# Patient Record
Sex: Female | Born: 1935
Health system: Southern US, Community
[De-identification: ages and names within clinical notes are randomized; demographics above are authoritative.]

## PROBLEM LIST (undated history)

## (undated) DIAGNOSIS — R9431 Abnormal electrocardiogram [ECG] [EKG]: Secondary | ICD-10-CM

## (undated) DIAGNOSIS — L719 Rosacea, unspecified: Secondary | ICD-10-CM

## (undated) DIAGNOSIS — I10 Essential (primary) hypertension: Secondary | ICD-10-CM

## (undated) DIAGNOSIS — I4891 Unspecified atrial fibrillation: Secondary | ICD-10-CM

## (undated) HISTORY — DX: Unspecified atrial fibrillation: I48.91

## (undated) HISTORY — DX: Abnormal electrocardiogram (ECG) (EKG): R94.31

## (undated) HISTORY — DX: Essential (primary) hypertension: I10

## (undated) HISTORY — DX: Rosacea, unspecified: L71.9

## (undated) HISTORY — PX: VARICOSE VEIN SURGERY: SHX832

---

## 2004-03-21 ENCOUNTER — Emergency Department (HOSPITAL_COMMUNITY): Admission: EM | Admit: 2004-03-21 | Discharge: 2004-03-21 | Payer: Self-pay | Admitting: Emergency Medicine

## 2008-07-01 ENCOUNTER — Ambulatory Visit: Payer: Self-pay | Admitting: Cardiovascular Disease

## 2008-07-01 ENCOUNTER — Encounter: Payer: Self-pay | Admitting: Cardiovascular Disease

## 2008-07-01 ENCOUNTER — Inpatient Hospital Stay (HOSPITAL_COMMUNITY): Admission: EM | Admit: 2008-07-01 | Discharge: 2008-07-06 | Payer: Self-pay | Admitting: Emergency Medicine

## 2008-07-30 ENCOUNTER — Ambulatory Visit: Payer: Self-pay | Admitting: Cardiovascular Disease

## 2008-10-16 ENCOUNTER — Ambulatory Visit: Payer: Self-pay | Admitting: Cardiovascular Disease

## 2008-10-16 DIAGNOSIS — I480 Paroxysmal atrial fibrillation: Secondary | ICD-10-CM | POA: Insufficient documentation

## 2008-10-16 DIAGNOSIS — I4891 Unspecified atrial fibrillation: Secondary | ICD-10-CM

## 2009-04-15 ENCOUNTER — Ambulatory Visit: Payer: Self-pay | Admitting: Cardiovascular Disease

## 2009-04-15 DIAGNOSIS — I10 Essential (primary) hypertension: Secondary | ICD-10-CM | POA: Insufficient documentation

## 2009-04-15 DIAGNOSIS — L719 Rosacea, unspecified: Secondary | ICD-10-CM | POA: Insufficient documentation

## 2009-04-30 ENCOUNTER — Ambulatory Visit: Payer: Self-pay | Admitting: Internal Medicine

## 2009-04-30 ENCOUNTER — Inpatient Hospital Stay (HOSPITAL_COMMUNITY): Admission: EM | Admit: 2009-04-30 | Discharge: 2009-05-03 | Payer: Self-pay | Admitting: Emergency Medicine

## 2009-06-10 ENCOUNTER — Ambulatory Visit: Payer: Self-pay | Admitting: Cardiovascular Disease

## 2009-06-15 LAB — CONVERTED CEMR LAB: Cortisol, Plasma: 9.5 ug/dL

## 2010-01-10 ENCOUNTER — Ambulatory Visit: Payer: Self-pay | Admitting: Cardiovascular Disease

## 2010-02-23 IMAGING — CR DG CHEST 1V PORT
1 series · 1 of 1 positions shown · non-contrast
Comparison: 07/01/2008

CLINICAL DATA: Chest pain.  Palpitations.

PORTABLE CHEST - 1 VIEW

[AP]
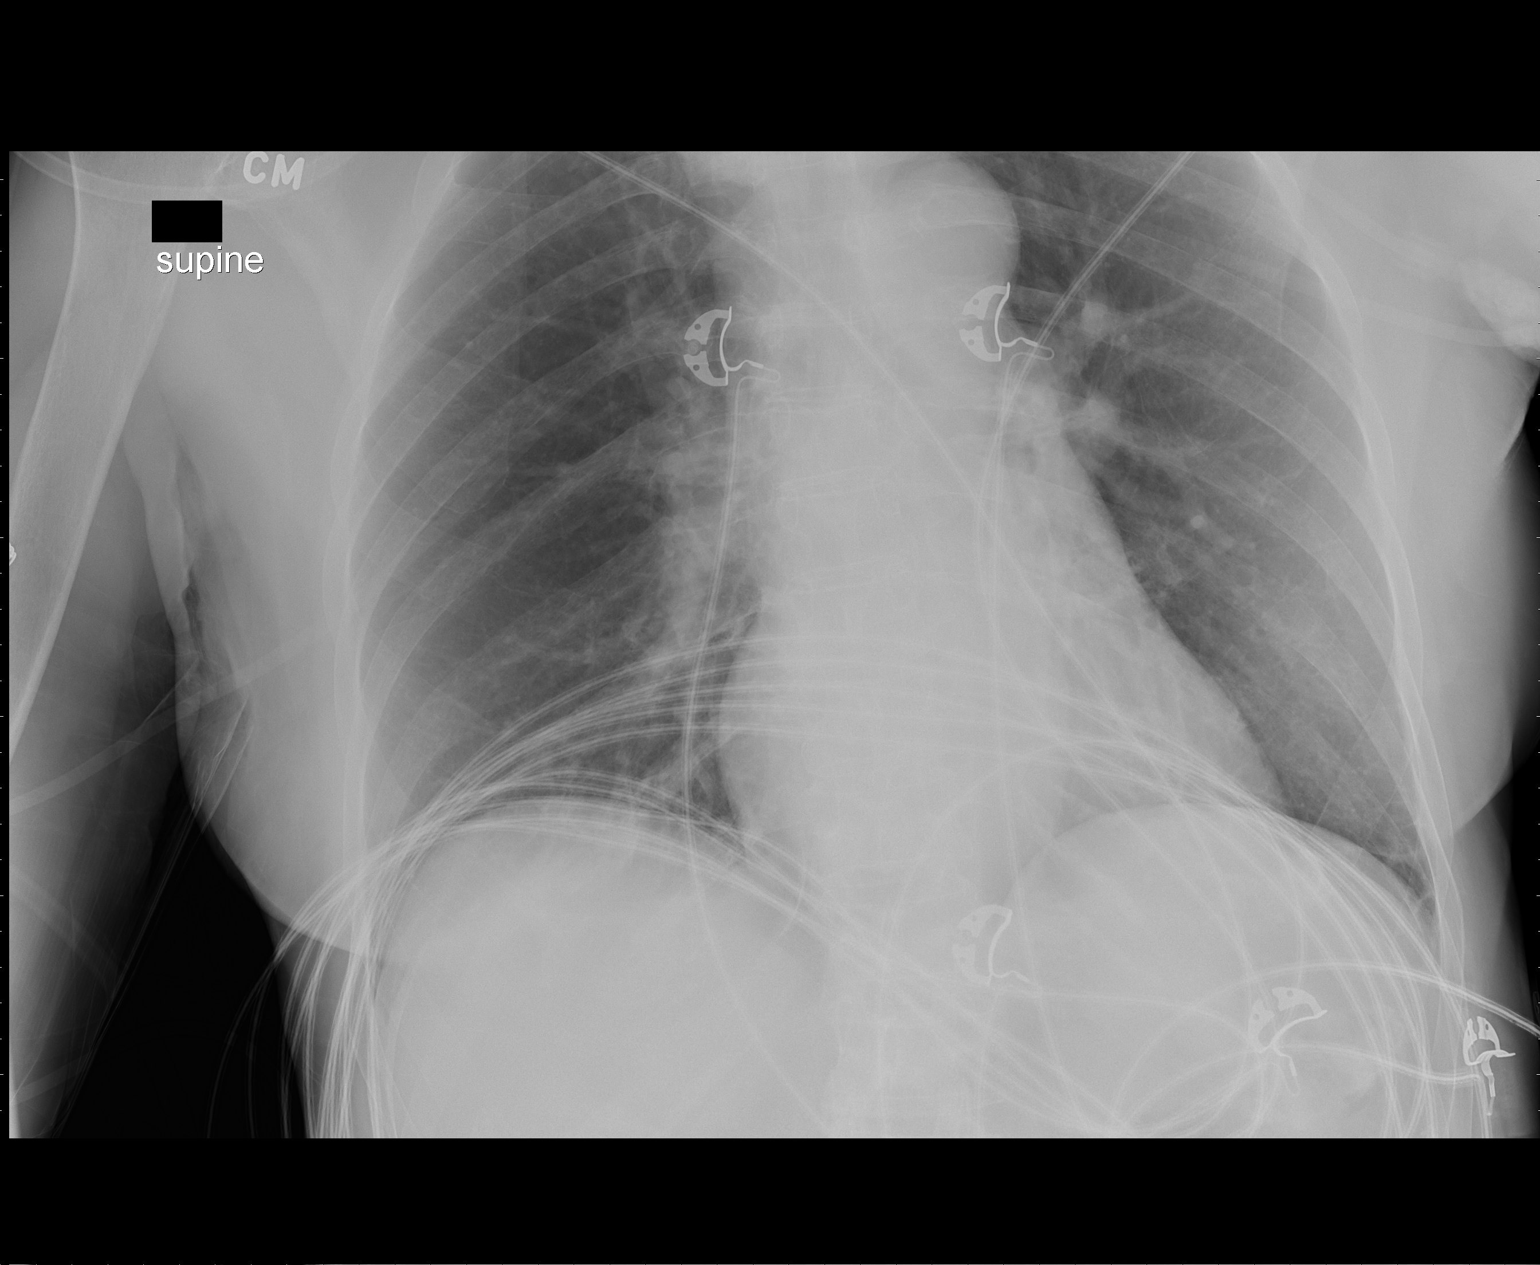

[1 of 1 positions shown; findings below may reference images not displayed]

FINDINGS: Numerous leads and wires project over the lower chest.
The lung apices are minimally excluded. Midline trachea. Normal
heart size.  Apparent right paratracheal soft tissue fullness is
likely due to technique.  No pleural effusion.  No definite
pneumothorax. Clear lungs.
IMPRESSION: 1. No acute cardiopulmonary disease.
2.  Minimal exclusion of the lung apices.

## 2010-04-08 ENCOUNTER — Encounter: Payer: Self-pay | Admitting: Cardiovascular Disease

## 2010-04-29 ENCOUNTER — Ambulatory Visit (HOSPITAL_COMMUNITY): Admission: RE | Admit: 2010-04-29 | Discharge: 2010-04-29 | Payer: Self-pay | Admitting: Internal Medicine

## 2010-05-02 ENCOUNTER — Ambulatory Visit: Payer: Self-pay | Admitting: Cardiovascular Disease

## 2010-05-02 DIAGNOSIS — R9431 Abnormal electrocardiogram [ECG] [EKG]: Secondary | ICD-10-CM | POA: Insufficient documentation

## 2010-07-22 ENCOUNTER — Encounter: Payer: Self-pay | Admitting: Cardiovascular Disease

## 2010-11-08 ENCOUNTER — Ambulatory Visit: Payer: Self-pay | Admitting: Cardiovascular Disease

## 2011-01-08 LAB — CONVERTED CEMR LAB
Cholesterol, target level: 200 mg/dL
HDL goal, serum: 40 mg/dL
LDL Goal: 130 mg/dL

## 2011-01-10 NOTE — Assessment & Plan Note (Signed)
Summary: eph   History of Present Illness: Amanda Olsen is seen today for followup of her paroxysmal atrial fibrillation.  She was recently hospitalized for her second recurrent event.  She was somewhat hypotensive during the hospitalization in A. fib.  She converted with higher dose flecainide at 100 mg b.i.d.  She still does taking aspirin.  She feels much better than when she was hospitalized.  Unfortunately she had a significant hematoma with her Pneumovax shot this also seems to have healed.  She has not had any recurrent palpitations.  Her random cortisol level was low in the hospital and needs to be repeated.  She has not any TIA or CVA-like symptoms.  She is walking with some fatigue but no dyspnea.  Problems Prior to Update: 1)  Essential Hypertension, Benign  (ICD-401.1) 2)  Rosacea  (ICD-695.3) 3)  Atrial Fibrillation  (ICD-427.31)  Current Problems (verified): 1)  Essential Hypertension, Benign  (ICD-401.1) 2)  Rosacea  (ICD-695.3) 3)  Atrial Fibrillation  (ICD-427.31)  Current Medications (verified): 1)  Aspirin Ec 325 Mg Tbec (Aspirin) .... Take One Tablet By Mouth Daily 2)  Flecainide Acetate 100 Mg Tabs (Flecainide Acetate) .... Take One Tablet By Mouth Every 12 Hours 3)  Vitamin D .... 1 Tab By Mouth Once Daily  Allergies (verified): No Known Drug Allergies  Past History:  Past Medical History: Last updated: 04/15/2009 Atrial Fibrillation Varicose Vein Stripping Cataract Hypertension  Family History: Last updated: 10/16/2008 Negative FH of Diabetes, Hypertension, or Coronary Artery Disease  Social History: Last updated: 10/16/2008 Lives with son and daughter in [place] Originally from [place] [employer] degree Does not smoke or drink  Review of Systems       Denies fever, malais, weight loss, blurry vision, decreased visual acuity, cough, sputum, SOB, hemoptysis, pleuritic pain, palpitaitons, heartburn, abdominal pain, melena, lower extremity edema, claudication,  or rash.  All othr systems reviewed and negative   Vital Signs:  Patient profile:   77 year old female Height:      66 inches Weight:      147 pounds BMI:     23.81 Pulse rate:   72 / minute Resp:     12 per minute BP sitting:   149 / 78  (left arm)  Vitals Entered By: Kem Parkinson (June 10, 2009 3:38 PM)  Physical Exam  General:  Affect appropriate Healthy:  appears stated age HEENT: normal Neck supple with no adenopathy JVP normal no bruits no thyromegaly Lungs clear with no wheezing and good diaphragmatic motion Heart:  S1/S2 no murmur,rub, gallop or click PMI normal Abdomen: benighn, BS positve, no tenderness, no AAA no bruit.  No HSM or HJR Distal pulses intact with no bruits No edema Neuro non-focal Skin warm and dry Rosacea on cheeks   Impression & Recommendations:  Problem # 1:  ATRIAL FIBRILLATION (ICD-427.31)  In NSR.  Still don't think she needs coumadin.  Two episodes with a [place] score of 2 continue ASA and  higher dose Flecainide Her updated medication list for this problem includes:    Aspirin Ec 325 Mg Tbec (Aspirin) .Marland Kitchen... Take one tablet by mouth daily    Flecainide Acetate 100 Mg Tabs (Flecainide acetate) .Marland Kitchen... Take one tablet by mouth every 12 hours  Her updated medication list for this problem includes:    Aspirin Ec 325 Mg Tbec (Aspirin) .Marland Kitchen... Take one tablet by mouth daily    Flecainide Acetate 100 Mg Tabs (Flecainide acetate) .Marland Kitchen... Take one tablet by mouth every 12  hours  Problem # 2:  ESSENTIAL HYPERTENSION, BENIGN (ICD-401.1)  She has significant hypotension in hospital witha low cortisol level.  Will recheck random cortisol today Her updated medication list for this problem includes:    Aspirin Ec 325 Mg Tbec (Aspirin) .Marland Kitchen... Take one tablet by mouth daily  Her updated medication list for this problem includes:    Aspirin Ec 325 Mg Tbec (Aspirin) .Marland Kitchen... Take one tablet by mouth daily  Orders: TLB-Cortisol (82533-CORT)  Other  Orders: T- * Misc. Laboratory test [phone])  Patient Instructions: 1)  Your physician wants you to follow-up in 6months   You will receive a reminder letter in the mail two months in advance. If you don't receive a letter, please call our office to schedule the follow-up appointment.

## 2011-01-10 NOTE — Assessment & Plan Note (Signed)
Summary: F3M/DM   Visit Type:  3 mo f/u Primary Provider:  Thayer Headings  CC:  no complaints today.  History of Present Illness: Amanda Olsen is seen today for F/U of HTN and PAF.  She has maintained NSR on Flecainide.  She did not have proarrythmia on ETT.  She has good LV function and no CAD by cath.   Her last visit we started ACE for BP contorl.    She sold her apartment there and is living independanlty in an apartment in [place].  She denies SSCP, palpitations, syncope, edema or dyspnea.  I reviewed lab work from 04/08/10 and her K was 3.9 with Cr .7 and normal LFTS.  LDL was 103.  Her ECG is chronically abnormal.  Today she had significant anterolateral T wave inversions that were unchanged from 5/10.  QT was stable at 424.  Lipid Management History:      Positive NCEP/ATP III risk factors include female age 18 years old or older and hypertension.    Current Problems (verified): 1)  Essential Hypertension, Benign  (ICD-401.1) 2)  Rosacea  (ICD-695.3) 3)  Atrial Fibrillation  (ICD-427.31)  Current Medications (verified): 1)  Aspirin Ec 325 Mg Tbec (Aspirin) .... Take One Tablet By Mouth Daily 2)  Flecainide Acetate 100 Mg Tabs (Flecainide Acetate) .... Take One Tablet By Mouth Every 12 Hours 3)  Lisinopril 10 Mg Tabs (Lisinopril) .... Take One Tablet By Mouth Daily 4)  Levothyroxine Sodium 25 Mcg Tabs (Levothyroxine Sodium) .Marland Kitchen.. 1 Tab Once Daily 5)  Vitamin D 2000 Unit Tabs (Cholecalciferol) .Marland Kitchen.. 1 Tab Once Daily 6)  Calcium 600 Mg Tabs (Calcium) .Marland Kitchen.. 1 Tab Once Daily 7)  Reclast 5 Mg/129ml Soln (Zoledronic Acid) .... As Directed  Allergies (verified): No Known Drug Allergies  Past History:  Past Medical History: Last updated: 04/15/2009 Atrial Fibrillation Varicose Vein Stripping Cataract Hypertension  Family History: Last updated: 10/16/2008 Negative FH of Diabetes, Hypertension, or Coronary Artery Disease  Social History: Last updated: 10/16/2008 Lives with son and  daughter in [place] Originally from [place] [employer] degree Does not smoke or drink  Review of Systems       Denies fever, malais, weight loss, blurry vision, decreased visual acuity, cough, sputum, SOB, hemoptysis, pleuritic pain, palpitaitons, heartburn, abdominal pain, melena, lower extremity edema, claudication, or rash.   Vital Signs:  Patient profile:   77 year old female Height:      66 inches Weight:      148 pounds BMI:     23.97 Pulse rate:   60 / minute Pulse rhythm:   irregular BP sitting:   128 / 87  (left arm) Cuff size:   large  Vitals Entered By: Danielle Rankin, CMA (May 02, 2010 10:04 AM)  Physical Exam  General:  Affect appropriate Healthy:  appears stated age HEENT: normal Neck supple with no adenopathy JVP normal no bruits no thyromegaly Lungs clear with no wheezing and good diaphragmatic motion Heart:  S1/S2 no murmur,rub, gallop or click PMI normal Abdomen: benighn, BS positve, no tenderness, no AAA no bruit.  No HSM or HJR Distal pulses intact with no bruits No edema Neuro non-focal Skin warm and dry    Impression & Recommendations:  Problem # 1:  ESSENTIAL HYPERTENSION, BENIGN (ICD-401.1) Improved on ACE Her updated medication list for this problem includes:    Aspirin Ec 325 Mg Tbec (Aspirin) .Marland Kitchen... Take one tablet by mouth daily    Lisinopril 10 Mg Tabs (Lisinopril) .Marland Kitchen... Take one tablet by mouth  daily  Problem # 2:  ATRIAL FIBRILLATION (ICD-427.31) Maint NSR on Flecainide Her updated medication list for this problem includes:    Aspirin Ec 325 Mg Tbec (Aspirin) .Marland Kitchen... Take one tablet by mouth daily    Flecainide Acetate 100 Mg Tabs (Flecainide acetate) .Marland Kitchen... Take one tablet by mouth every 12 hours  Orders: EKG w/ Interpretation (93000)  Problem # 3:  ELECTROCARDIOGRAM, ABNORMAL (ICD-794.31) No CAD by previous cath.  Echo with no HOCM.  QT ok  Follow Her updated medication list for this problem includes:    Aspirin Ec 325 Mg Tbec  (Aspirin) .Marland Kitchen... Take one tablet by mouth daily    Flecainide Acetate 100 Mg Tabs (Flecainide acetate) .Marland Kitchen... Take one tablet by mouth every 12 hours    Lisinopril 10 Mg Tabs (Lisinopril) .Marland Kitchen... Take one tablet by mouth daily  Lipid Assessment/Plan:      Based on NCEP/ATP III, the patient's risk factor category is "2 or more risk factors and a calculated 10 year CAD risk of > 20%".  The patient's lipid goals are as follows: Total cholesterol goal is 200; LDL cholesterol goal is 130; HDL cholesterol goal is 40; Triglyceride goal is 150.     Patient Instructions: 1)  Your physician recommends that you schedule a follow-up appointment in: 6 months with Dr Eden Emms 2)  Your physician recommends that you continue on your current medications as directed. Please refer to the Current Medication list given to you today.

## 2011-01-10 NOTE — Assessment & Plan Note (Signed)
Summary: rov   History of Present Illness: Amanda Olsen is seen today for F/U of HTN and PAF.  She has maintained NSR on Flecainide.  She did not have proarrythmia on ETT.  She has good LV function and no CAD by cath.  Her BP is running high.  Reviewe of her records indicates to me that she needs Rx.  We will start lisinopril 10mg .  She was back in [place] for 2 months and saw a doctor for [employer].  She is not taking TCN but has cream to apply topically.  She sold her apartment there and is living independanlty in an apartment in [place].  She denies SSCP, palpitations, syncope, edema or dyspnea.  She will need a BMET 4-6 weeks after starting ACE.   Current Problems (verified): 1)  Essential Hypertension, Benign  (ICD-401.1) 2)  Rosacea  (ICD-695.3) 3)  Atrial Fibrillation  (ICD-427.31)  Current Medications (verified): 1)  Aspirin Ec 325 Mg Tbec (Aspirin) .... Take One Tablet By Mouth Daily 2)  Flecainide Acetate 100 Mg Tabs (Flecainide Acetate) .... Take One Tablet By Mouth Every 12 Hours 3)  Lisinopril 10 Mg Tabs (Lisinopril) .... Take One Tablet By Mouth Daily  Allergies (verified): No Known Drug Allergies  Past History:  Past Medical History: Last updated: 04/15/2009 Atrial Fibrillation Varicose Vein Stripping Cataract Hypertension  Family History: Last updated: 10/16/2008 Negative FH of Diabetes, Hypertension, or Coronary Artery Disease  Social History: Last updated: 10/16/2008 Lives with son and daughter in [place] Originally from [place] [employer] degree Does not smoke or drink  Review of Systems       Denies fever, malais, weight loss, blurry vision, decreased visual acuity, cough, sputum, SOB, hemoptysis, pleuritic pain, palpitaitons, heartburn, abdominal pain, melena, lower extremity edema, claudication, or rash. all other systems reviewed and negative  Vital Signs:  Patient profile:   77 year old female Height:      66 inches Weight:      147 pounds BMI:      23.81 Pulse rate:   68 / minute Resp:     12 per minute BP sitting:   157 / 86  (left arm)  Vitals Entered By: Kem Parkinson (January 10, 2010 10:14 AM)  Physical Exam  General:  Affect appropriate Healthy:  appears stated age HEENT: normal Neck supple with no adenopathy JVP normal no bruits no thyromegaly Lungs clear with no wheezing and good diaphragmatic motion Heart:  S1/S2 no murmur,rub, gallop or click PMI normal Abdomen: benighn, BS positve, no tenderness, no AAA no bruit.  No HSM or HJR Distal pulses intact with no bruits No edema Neuro non-focal Skin warm and dry Rosacea on cheeks   Impression & Recommendations:  Problem # 1:  ESSENTIAL HYPERTENSION, BENIGN (ICD-401.1) Start ACE Her updated medication list for this problem includes:    Aspirin Ec 325 Mg Tbec (Aspirin) .Marland Kitchen... Take one tablet by mouth daily    Lisinopril 10 Mg Tabs (Lisinopril) .Marland Kitchen... Take one tablet by mouth daily  Problem # 2:  ROSACEA (ICD-695.3) Topical cream F/U dermatologist  Problem # 3:  ATRIAL FIBRILLATION (ICD-427.31) Continue Flecainide and ASA.  No symptoms to suggest recurrence Her updated medication list for this problem includes:    Aspirin Ec 325 Mg Tbec (Aspirin) .Marland Kitchen... Take one tablet by mouth daily    Flecainide Acetate 100 Mg Tabs (Flecainide acetate) .Marland Kitchen... Take one tablet by mouth every 12 hours  Patient Instructions: 1)  Your physician recommends that you schedule a follow-up appointment in: 3  MONTHS 2)  Your physician recommends that you return for lab work in:3-4 WEEKS/BMP/401.1/V58.69 3)  Your physician has recommended you make the following change in your medication: START LISINOPRIL 10MG  ONE TABLET ONCE DAILY Prescriptions: LISINOPRIL 10 MG TABS (LISINOPRIL) Take one tablet by mouth daily  #30 x 12   Entered by:   Deliah Goody, RN   Authorized by:   Colon Branch, MD, [facility]   Signed by:   Deliah Goody, RN on 01/10/2010   Method used:   Electronically to         [facility] [address] [phone]* (retail)       [address]       [place], [place]  [address]       Ph: [phone] or [phone]       Fax: [phone]   RxID:   [id number]

## 2011-01-10 NOTE — Assessment & Plan Note (Signed)
Summary: 6 month rov.sl   Primary Provider:  Thayer Headings   History of Present Illness: Amanda Olsen is seen today for F/U of HTN and PAF.  She has maintained NSR on Flecainide.  She did not have proarrythmia on ETT.  She has good LV function and no CAD by cath.   Her last visit we started ACE for BP contorl.    She sold her apartment there and is living independanlty in an apartment in [place].  She denies SSCP, palpitations, syncope, edema or dyspnea.  I reviewed lab work from 04/08/10 and her K was 3.9 with Cr .7 and normal LFTS.  LDL was 103.  Her ECG is chronically abnormal.  Today she had significant anterolateral T wave inversions that were unchanged from 5/10.  QT was stable at 424.  Current Problems (verified): 1)  Electrocardiogram, Abnormal  (ICD-794.31) 2)  Essential Hypertension, Benign  (ICD-401.1) 3)  Rosacea  (ICD-695.3) 4)  Atrial Fibrillation  (ICD-427.31)  Current Medications (verified): 1)  Aspirin Ec 325 Mg Tbec (Aspirin) .... Take One Tablet By Mouth Daily 2)  Flecainide Acetate 100 Mg Tabs (Flecainide Acetate) .... Take One Tablet By Mouth Every 12 Hours 3)  Lisinopril 10 Mg Tabs (Lisinopril) .... Take One Tablet By Mouth Daily 4)  Levothyroxine Sodium 25 Mcg Tabs (Levothyroxine Sodium) .Marland Kitchen.. 1 Tab Once Daily 5)  Vitamin D 2000 Unit Tabs (Cholecalciferol) .Marland Kitchen.. 1 Tab Once Daily 6)  Calcium 600 Mg Tabs (Calcium) .Marland Kitchen.. 1 Tab Once Daily 7)  Reclast 5 Mg/120ml Soln (Zoledronic Acid) .... As Directed  Allergies (verified): No Known Drug Allergies  Past History:  Past Medical History: Last updated: 04/15/2009 Atrial Fibrillation Varicose Vein Stripping Cataract Hypertension  Family History: Last updated: 10/16/2008 Negative FH of Diabetes, Hypertension, or Coronary Artery Disease  Social History: Last updated: 10/16/2008 Lives with son and daughter in [place] Originally from [place] [employer] degree Does not smoke or drink  Review of Systems       Denies  fever, malais, weight loss, blurry vision, decreased visual acuity, cough, sputum, SOB, hemoptysis, pleuritic pain, palpitaitons, heartburn, abdominal pain, melena, lower extremity edema, claudication, or rash.   Vital Signs:  Patient profile:   77 year old female Height:      66 inches Weight:      151 pounds BMI:     24.46 Pulse rate:   61 / minute Resp:     14 per minute BP sitting:   134 / 81  (left arm)  Vitals Entered By: Kem Parkinson (November 08, 2010 9:46 AM)  Physical Exam  General:  Affect appropriate Healthy:  appears stated age HEENT: normal Neck supple with no adenopathy JVP normal no bruits no thyromegaly Lungs clear with no wheezing and good diaphragmatic motion Heart:  S1/S2 no murmur,rub, gallop or click PMI normal Abdomen: benighn, BS positve, no tenderness, no AAA no bruit.  No HSM or HJR Distal pulses intact with no bruits No edema Neuro non-focal Skin warm and dry    Impression & Recommendations:  Problem # 1:  ESSENTIAL HYPERTENSION, BENIGN (ICD-401.1) Component of white coat.  BP at home has been excellent.  Only taking 5mg  of lisinoprl  10mg  drops BP too much Her updated medication list for this problem includes:    Aspirin Ec 325 Mg Tbec (Aspirin) .Marland Kitchen... Take one tablet by mouth daily    Lisinopril 10 Mg Tabs (Lisinopril) .Marland Kitchen... Take one tablet by mouth daily  Problem # 2:  ATRIAL FIBRILLATION (ICD-427.31) Maint NSR on  flecainide  No palpitations Her updated medication list for this problem includes:    Aspirin Ec 325 Mg Tbec (Aspirin) .Marland Kitchen... Take one tablet by mouth daily    Flecainide Acetate 100 Mg Tabs (Flecainide acetate) .Marland Kitchen... Take one tablet by mouth every 12 hours  Patient Instructions: 1)  Your physician wants you to follow-up in: 6 MONTHS  You will receive a reminder letter in the mail two months in advance. If you don't receive a letter, please call our office to schedule the follow-up appointment.

## 2011-01-10 NOTE — Assessment & Plan Note (Signed)
Summary: f75m/dm  Medications Added FLECAINIDE ACETATE 50 MG TABS (FLECAINIDE ACETATE) Take 1 tablet by mouth twice a day      Allergies Added: NKDA  CC:  follow up pt was on vitamin d 50 and 000 but pt states she stopped it due to dieary.  History of Present Illness: Denis does not speak good [place] and is without an interpreter today.  Primarily had seen her for borderline hypertension and history of atrial fibrillation.  He has been stable on low-dose lacunae.  A treadmill last year with no proarrhythmia.  Not had any recurrence.  Blood pressures been fairly well controlled.  Not had any palpitations chest pain PND or orthopnea she does complain of some neuropathy in her feet.  He also has had worsening rosacea and the cheeks.  Don't know if this is been treated in the past.  She seems to think it worsened after her blood tonight therapy but I am unaware of this side effect.  Taking a baby aspirin.  No indication for Coumadin at this time.  He has a structurally normal heart.  Current Problems (verified): 1)  Essential Hypertension, Benign  (ICD-401.1) 2)  Rosacea  (ICD-695.3) 3)  Atrial Fibrillation  (ICD-427.31)  Current Medications (verified): 1)  Adult Aspirin Ec Low Strength 81 Mg Tbec (Aspirin) .Marland Kitchen.. 1 Tab Per Day 2)  Flecainide Acetate 50 Mg Tabs (Flecainide Acetate) .... Take 1 Tablet By Mouth Twice A Day  Allergies (verified): No Known Drug Allergies  Past History:  Family History:    Negative FH of Diabetes, Hypertension, or Coronary Artery Disease     (10/16/2008)  Social History:    Lives with son and daughter in [place]    Originally from [place]    Engineering degree    Does not smoke or drink (10/16/2008)  Past Medical History:    Atrial Fibrillation    Varicose Vein Stripping    Cataract    Hypertension  Review of Systems       Denies fever, malais, weight loss, blurry vision, decreased visual acuity, cough, sputum, SOB, hemoptysis, pleuritic pain,  palpitaitons, heartburn, abdominal pain, melena, lower extremity edema, claudication, or rash. other systems reviewed and negative  Vital Signs:  Patient profile:   77 year old female Height:      66 inches Weight:      149 pounds BMI:     24.14 Pulse rate:   77 / minute Resp:     12 per minute BP sitting:   140 / 80  (right arm)  Vitals Entered By: Kem Parkinson (Apr 15, 2009 11:41 AM)  Physical Exam  General:  Affect appropriate Healthy:  appears stated age HEENT: normal Neck supple with no adenopathy JVP normal no bruits no thyromegaly Lungs clear with no wheezing and good diaphragmatic motion Heart:  S1/S2 no murmur,rub, gallop or click PMI normal Abdomen: benighn, BS positve, no tenderness, no AAA no bruit.  No HSM or HJR Distal pulses intact with no bruits No edema Neuro non-focal Skin warm and dry Rosacea on cheeks   EKG  Procedure date:  04/15/2009  Findings:      NSR 72 ST/T wave changes inferior/lateral T wave changes QT 410/448  Impression & Recommendations:  Problem # 1:  ATRIAL FIBRILLATION (ICD-427.31) Non-recurrent on Fecainide.  QT 410 on ECG today The following medications were removed from the medication list:    Tambocor 50 Mg Tabs (Flecainide acetate) .Marland Kitchen... 1 tab bid Her updated medication list for this  problem includes:    Adult Aspirin Ec Low Strength 81 Mg Tbec (Aspirin) .Marland Kitchen... 1 tab per day    Flecainide Acetate 50 Mg Tabs (Flecainide acetate) .Marland Kitchen... Take 1 tablet by mouth twice a day  Problem # 2:  ESSENTIAL HYPERTENSION, BENIGN (ICD-401.1) Low sodium DASH Diet Her updated medication list for this problem includes:    Adult Aspirin Ec Low Strength 81 Mg Tbec (Aspirin) .Marland Kitchen... 1 tab per day  Problem # 3:  ROSACEA (ICD-695.3) Refer to Dermatology Orders: Misc. Referral (Misc. Ref)  Patient Instructions: 1)  Your physician recommends that you schedule a follow-up appointment in: 6 MONTHS 2)  You have been referred to [facility]  DERMATOLOGY

## 2011-03-21 LAB — URINALYSIS, ROUTINE W REFLEX MICROSCOPIC
Bilirubin Urine: NEGATIVE
Glucose, UA: NEGATIVE mg/dL
Ketones, ur: NEGATIVE mg/dL
Leukocytes, UA: NEGATIVE
Nitrite: NEGATIVE
Protein, ur: NEGATIVE mg/dL
Specific Gravity, Urine: 1.005 (ref 1.005–1.030)
Urobilinogen, UA: 0.2 mg/dL (ref 0.0–1.0)
pH: 7 (ref 5.0–8.0)

## 2011-03-21 LAB — TSH: TSH: 6.204 u[IU]/mL — ABNORMAL HIGH (ref 0.350–4.500)

## 2011-03-21 LAB — CARDIAC PANEL(CRET KIN+CKTOT+MB+TROPI)
CK, MB: 2 ng/mL (ref 0.3–4.0)
CK, MB: 2.4 ng/mL (ref 0.3–4.0)
Relative Index: 2.1 (ref 0.0–2.5)
Relative Index: INVALID (ref 0.0–2.5)
Total CK: 117 U/L (ref 7–177)
Total CK: 97 U/L (ref 7–177)
Troponin I: 0.01 ng/mL (ref 0.00–0.06)
Troponin I: 0.01 ng/mL (ref 0.00–0.06)

## 2011-03-21 LAB — BASIC METABOLIC PANEL
BUN: 11 mg/dL (ref 6–23)
BUN: 11 mg/dL (ref 6–23)
CO2: 24 mEq/L (ref 19–32)
CO2: 27 mEq/L (ref 19–32)
Calcium: 8.2 mg/dL — ABNORMAL LOW (ref 8.4–10.5)
Calcium: 8.6 mg/dL (ref 8.4–10.5)
Chloride: 104 mEq/L (ref 96–112)
Chloride: 109 mEq/L (ref 96–112)
Creatinine, Ser: 0.59 mg/dL (ref 0.4–1.2)
Creatinine, Ser: 0.77 mg/dL (ref 0.4–1.2)
GFR calc Af Amer: >60 mL/min (ref >60)
GFR calc Af Amer: >60 mL/min (ref >60)
GFR calc non Af Amer: >60 mL/min (ref >60)
GFR calc non Af Amer: >60 mL/min (ref >60)
Glucose, Bld: 121 mg/dL — ABNORMAL HIGH (ref 70–99)
Glucose, Bld: 129 mg/dL — ABNORMAL HIGH (ref 70–99)
Potassium: 3.3 mEq/L — ABNORMAL LOW (ref 3.5–5.1)
Potassium: 3.8 mEq/L (ref 3.5–5.1)
Sodium: 137 mEq/L (ref 135–145)
Sodium: 139 mEq/L (ref 135–145)

## 2011-03-21 LAB — CBC
HCT: 29.8 % — ABNORMAL LOW (ref 36.0–46.0)
HCT: 37.2 % (ref 36.0–46.0)
Hemoglobin: 10.3 g/dL — ABNORMAL LOW (ref 12.0–15.0)
Hemoglobin: 12.9 g/dL (ref 12.0–15.0)
MCHC: 34.5 g/dL (ref 30.0–36.0)
MCHC: 34.8 g/dL (ref 30.0–36.0)
MCV: 111.3 fL — ABNORMAL HIGH (ref 78.0–100.0)
MCV: 112.4 fL — ABNORMAL HIGH (ref 78.0–100.0)
Platelets: 118 10*3/uL — ABNORMAL LOW (ref 150–400)
Platelets: 158 10*3/uL (ref 150–400)
RBC: 2.67 MIL/uL — ABNORMAL LOW (ref 3.87–5.11)
RBC: 3.31 MIL/uL — ABNORMAL LOW (ref 3.87–5.11)
RDW: 15 % (ref 11.5–15.5)
RDW: 15.4 % (ref 11.5–15.5)
WBC: 4.1 10*3/uL (ref 4.0–10.5)
WBC: 4.8 10*3/uL (ref 4.0–10.5)

## 2011-03-21 LAB — POCT I-STAT, CHEM 8
BUN: 14 mg/dL (ref 6–23)
Calcium, Ion: 1.11 mmol/L — ABNORMAL LOW (ref 1.12–1.32)
Chloride: 106 mEq/L (ref 96–112)
Creatinine, Ser: 1 mg/dL (ref 0.4–1.2)
Glucose, Bld: 121 mg/dL — ABNORMAL HIGH (ref 70–99)
HCT: 40 % (ref 36.0–46.0)
Hemoglobin: 13.6 g/dL (ref 12.0–15.0)
Potassium: 3.4 mEq/L — ABNORMAL LOW (ref 3.5–5.1)
Sodium: 143 mEq/L (ref 135–145)
TCO2: 26 mmol/L (ref 0–100)

## 2011-03-21 LAB — PROTIME-INR
INR: 1 (ref 0.00–1.49)
INR: 1.2 (ref 0.00–1.49)
INR: 3 — ABNORMAL HIGH (ref 0.00–1.49)
INR: 4.4 — ABNORMAL HIGH (ref 0.00–1.49)
Prothrombin Time: 13.5 seconds (ref 11.6–15.2)
Prothrombin Time: 15.5 seconds — ABNORMAL HIGH (ref 11.6–15.2)
Prothrombin Time: 33.7 seconds — ABNORMAL HIGH (ref 11.6–15.2)
Prothrombin Time: 46.3 seconds — ABNORMAL HIGH (ref 11.6–15.2)

## 2011-03-21 LAB — CORTISOL: Cortisol, Plasma: 1.5 ug/dL

## 2011-03-21 LAB — DIFFERENTIAL
Basophils Absolute: 0 10*3/uL (ref 0.0–0.1)
Basophils Relative: 1 % (ref 0–1)
Eosinophils Absolute: 0.3 10*3/uL (ref 0.0–0.7)
Eosinophils Relative: 6 % — ABNORMAL HIGH (ref 0–5)
Lymphocytes Relative: 44 % (ref 12–46)
Lymphs Abs: 2.1 10*3/uL (ref 0.7–4.0)
Monocytes Absolute: 0.4 10*3/uL (ref 0.1–1.0)
Monocytes Relative: 8 % (ref 3–12)
Neutro Abs: 2 10*3/uL (ref 1.7–7.7)
Neutrophils Relative %: 41 % — ABNORMAL LOW (ref 43–77)

## 2011-03-21 LAB — CK TOTAL AND CKMB (NOT AT ARMC)
CK, MB: 3.1 ng/mL (ref 0.3–4.0)
Relative Index: 1.8 (ref 0.0–2.5)
Total CK: 176 U/L (ref 7–177)

## 2011-03-21 LAB — APTT: aPTT: 28 seconds (ref 24–37)

## 2011-03-21 LAB — URINE MICROSCOPIC-ADD ON

## 2011-03-21 LAB — TROPONIN I: Troponin I: 0.01 ng/mL (ref 0.00–0.06)

## 2011-03-21 LAB — FLECAINIDE LEVEL: Flecainide: 0.36 ug/mL (ref 0.20–1.00)

## 2011-04-25 NOTE — Discharge Summary (Signed)
NAMECICILIA, Amanda Olsen             ACCOUNT NO.:  [id number]   MEDICAL RECORD NO.:  [id number]          PATIENT TYPE:  INP   LOCATION:  3705                         FACILITY:  [facility]   PHYSICIAN:  Peter C. Eden Emms, MD, FACCDATE OF BIRTH:  Jun 10, 1934   DATE OF ADMISSION:  04/30/2009  DATE OF DISCHARGE:  05/03/2009                               DISCHARGE SUMMARY   PRIMARY CARDIOLOGIST:  Theron Arista C. Eden Emms, MD, [facility]   DISCHARGING DIAGNOSES:  1. Atrial fibrillation.  Be managed with flecainide which has been      increased to 100 mg p.o. b.i.d. during this hospitalization.  The      patient is not being discharged on Coumadin therapy.  She is being      discharged home on full-strength aspirin.  2. Low random cortisol level of 1.5, pending possible further workup      outpatient with Dr. Eden Emms.  3. Questionable reaction to pneumonia shot, right deltoid muscle.  We      will reevaluate outpatient.   PAST MEDICAL HISTORY:  1. Paroxysmal atrial fibrillation.  2. Status post left lower extremity varicose vein stripping  3. Cataract surgery.   HOSPITAL COURSE:  [name] is a 77 year old [place] female with a  history of paroxysmal atrial fibrillation followed by Dr. Charlton Haws  who presented this admission complaining of palpitations at woke her  from sleep.  She denied any episodes of chest discomfort.  Her blood  pressure was stable at 117/98, heart rate of 130.  Cardiac enzymes  negative.  EKG showed atrial fibrillation at a rate of 150 with rate-  dependent left bundle branch block.  The patient was admitted.  Flecainide dose increased and the patient was started on Coumadin,  continued heparin with plans for possible TEE with cardioversion.  The  patient was admitted to telemetry, received a Pneumovax injection in  right arm, note some swelling, redness, and tightness, treated with ice  pack with improvement.  Random cortisol level obtained secondary to  hypotension.  The patient  quickly became therapeutic on Coumadin.  On  day of discharge, Dr. Eden Emms spoke at length with the patient's family.  It was decided not to discharge the patient home on Coumadin therapy,  but to use full-dose aspirin therapy instead.  The patient being  discharged on flecainide 100 mg p.o. b.i.d., QT was 447.  Low cortisol  level.  We will repeat outpatient followup per  Dr. Eden Emms.  At time of discharge, the patient is going on aspirin 325  daily, flecainide 100 mg p.o. b.i.d. prescription has been provided.  I  have scheduled her to follow up with Dr. Eden Emms on June 10, 2009 at 3:45.   DURATION OF DISCHARGE ENCOUNTERED:  Over 30 minutes.      Dorian Pod, ACNP      Noralyn Pick. Eden Emms, MD, [facility]  Electronically Signed    MB/MEDQ  D:  05/03/2009  T:  05/03/2009  Job:  [id number]

## 2011-04-25 NOTE — H&P (Signed)
Amanda Olsen, Amanda Olsen             ACCOUNT NO.:  [id number]   MEDICAL RECORD NO.:  [id number]          PATIENT TYPE:  INP   LOCATION:  3310                         FACILITY:  [facility]   PHYSICIAN:  Wendi Snipes, MD DATE OF BIRTH:  06-02-1934   DATE OF ADMISSION:  04/30/2009  DATE OF DISCHARGE:                              HISTORY & PHYSICAL   CARDIOLOGIST:  Dr. Eden Emms.   CHIEF COMPLAINTS:  Palpitations.   HISTORY OF PRESENT ILLNESS:  This is a 77 year old [place] female with a  history of paroxysmal atrial fibrillation who presents with acute onset  of palpitations that awoke her from sleep at approximately midnight this  evening.  She states that she has been in her usual state of health and  denies chest pain, shortness of breath, increased lower extremity edema,  paroxysmal nocturnal dyspnea, syncope or presyncope.  She states that  she has been well controlled on flecainide since seeing Dr. Eden Emms for  her initial onset of atrial fibrillation in 2009.   ALLERGIES:  No known drug allergies.   MEDICATIONS ON ADMISSION:  1. Flecainide 50 mg twice daily.  2. Aspirin 81 mg daily.   PAST MEDICAL HISTORY:  1. Paroxysmal atrial fibrillation.  2. Easy bruising per patient.   SOCIAL HISTORY:  She lives in [place] with her son and her daughter-  in-law.  She is a retired Runner, broadcasting/film/video in [place].   FAMILY HISTORY:  Her mother had a stroke, and her father had a stroke.   REVIEW OF SYSTEMS:  All 14 systems were reviewed and were negative  except as mentioned in detail in the HPI.   PHYSICAL EXAMINATION:  Her blood pressure is 117/98, respiratory rate is  18, pulse is 130.  Her temperature is febrile.  She is saturating 99% on  room air.  GENERAL:  She is a 77 year old female appearing stated age in no acute  distress.  HEENT:  Moist mucous membranes.  Pupils are equal, round, and reactive  to light and accommodation.  Anicteric sclera.  NECK:  No jugular venous distention.  No  thyromegaly.  CARDIOVASCULAR:  Irregular rate, irregular rhythm, tachycardiac.  No  murmurs.  LUNGS:  Clear to auscultation bilaterally.  ABDOMEN:  Nontender, nondistended with positive bowel sounds.  No  masses.  EXTREMITIES:  No clubbing, cyanosis or edema.  NEUROLOGIC:  Alert and oriented x3.  Cranial nerves II-XII grossly  intact.  No focal neurologic deficits.  SKIN:  Warm, dry,  and intact.  No rashes.  PSYCH:  Mood and affect are appropriate.   RADIOLOGY:  Chest x-ray is pending.  EKG showed atrial fibrillation with  a rate of 151 beats per minute with a rate-dependent left bundle branch  block.   LABORATORY REVIEW:  White blood cell count 4.8, hematocrit 37.  Her  platelets are 158.  Her potassium is 3.3.  Her creatinine is 0.77.  Her  BUN is 11.  Her troponin is 0.01.  Her CK-MB is 3.1.  Her INR is 1.0.   ASSESSMENT:  This is a 77 year old white female with a history of  paroxysmal atrial fibrillation currently  on flecainide here with  recurrence of her atrial fibrillation and now with rapid ventricular  response.   Atrial fibrillation.  Will continue her flecainide at this point.  Will  start Cardizem for rate control and a beta-blocker.  Consider increasing  flecainide.  Will start full-dose anticoagulation in preparation for a  possible TEE/DCCV in the morning if she does not convert with rate  control.  We will evaluate other causes of atrial fibrillation including  a thyroid panel and cardiac panel.  Otherwise, we will monitor very  closely for any other specific illness that may be driving this.      Wendi Snipes, MD  Electronically Signed     BHH/MEDQ  D:  04/30/2009  T:  04/30/2009  Job:  [id number]

## 2011-04-25 NOTE — Procedures (Signed)
[facility]                              EXERCISE TREADMILL   NAYELY, DINGUS                      MRN:          [id number]  DATE:07/30/2008                            DOB:          1934/08/28    Ms. Ratterree is  seen today in followup for a stress test.  She is 77-  year-old.  She was hospitalized with paroxysmal atrial fibrillation.  She had a heart cath that showed no coronary artery disease.  Her  baseline EKG is abnormal with inferior lateral T-wave changes.   She exercised for 4 minutes and 30 seconds on a modified Bruce protocol.  Exercise was stopped due to fatigue and shortness of breath.  Maximal  heart rate was 117, peak blood pressure is 182/87.  She developed a left  bundle-branch block during exercise but had no significant  proarrhythmia.  Ischemia was indeterminate due to baseline changes.   IMPRESSION:  An exercise stress test with no evidence of proarrhythmia.  The patient will continue flecainide for her paroxysmal atrial  fibrillation.  I will see her back in 3 months.     Noralyn Pick. Eden Emms, MD, [facility]  Electronically Signed    PCN/MedQ  DD: 07/30/2008  DT: 07/31/2008  Job #: [id number]

## 2011-04-25 NOTE — H&P (Signed)
NAMESHAYNAH, HUND             ACCOUNT NO.:  [id number]   MEDICAL RECORD NO.:  [id number]          PATIENT TYPE:  INP   LOCATION:  3703                         FACILITY:  [facility]   PHYSICIAN:  Peter C. Eden Emms, MD, FACCDATE OF BIRTH:  18-Oct-1934   DATE OF ADMISSION:  07/01/2008  DATE OF DISCHARGE:                              HISTORY & PHYSICAL   Primary care physician is in [place].   CARDIOLOGIST:  Noralyn Pick. Eden Emms, MD, [facility]   CHIEF COMPLAINT:  Chest pain/palpitations.   HISTORY OF PRESENT ILLNESS:  Ms. Dershem is a 77 year old [place]  white female with no history of coronary artery disease.  She was in her  usual state of health yesterday.  She was awakened at approximately  midnight by palpitations and chest pain that she describes as a  pressure.  It was 4 or 5/10.  It was associated with diaphoresis and  weakness, but no nausea, vomiting, or shortness of breath.  She did have  some dyspnea on exertion.  She has had the palpitations before, but this  is the first episode in over a year.  She went to the bathroom and had a  bowel movement and felt better, but was very weak.  She has a  prescription for propranolol 40 mg to take as needed.  When her symptoms  did not resolve at approximately 5:30 a.m., she took one.  After that,  she felt some decrease in her palpitations, but her presyncope became  worse and she came to the emergency room.  In the emergency room, her  EKG showed AFib with a rapid ventricular response and heart rate in the  130s.  She received IV Cardizem as well as oxygen and four baby aspirin.  Currently, she is feeling much better and her chest pain has resolved,  except for a small amount of pressure.   PAST MEDICAL HISTORY:  1. History of arrhythmia, very likely atrial fibrillation (evaluated      in [place] with an EKG, echo, and thyroid testing).  2. Reported history of easy bleeding, but never evaluated for this.  3. She has no history of diabetes,  hypertension, hyperlipidemia,      family history of premature coronary artery disease, or tobacco      use.   SURGICAL HISTORY:  She is status post left lower extremity varicose vein  stripping and cataract surgery.   ALLERGIES:  No known drug allergies.   CURRENT MEDICATIONS:  (Translated from [place])  1. Propranolol 40 mg one half to one tablet p.r.n.  2. Probably sotalol 160 mg one-fourth tablet b.i.d.   SOCIAL HISTORY:  She lives in [place] with her son and daughter-in-  Social worker and granddaughter.  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  She has no history of alcohol, tobacco, or drug use.   FAMILY HISTORY:  Both of her parents were in their early to mid 31s when  they died, and she believes that both of them had a stroke.  She had one  sister that had Parkinson disease, but does not believe anyone had  coronary artery  disease, although she thinks her mother had arrhythmia  as well.   REVIEW OF SYSTEMS:  She has had nosebleeds in the past with aspirin.  The chest pain as described above.  She does not normally have  palpitations on a daily basis.  She has occasional reflux symptoms and  arthralgias, but denies hematemesis, hemoptysis, or melena.  She had  some bleeding from her gums when she brushed her teeth, but has seen a  dentist more regularly since moving to this country and those problems  are resolved.  Full 14-point review of systems is otherwise negative.   PHYSICAL EXAM:  VITAL SIGNS: Temperature is 98.2; blood pressure  initially 92/64, now 117/73; pulse initially 131, now 84; respiratory  rate 18; and O2 saturation 94% on room air.  GENERAL:  She is a well-developed, well-nourished elderly white female,  in no acute distress.  HEENT:  Normal.  NECK:  There is no lymphadenopathy, thyromegaly, bruit, or JVD noted.  CV:  Heart is irregular in rate and rhythm with an S1 and S2, and no  significant murmur, rub, or gallop is noted.  LUNGS:  She  has a few rales in the bases, but no crackles or wheeze.  SKIN:  No rashes or lesions are noted.  ABDOMEN:  Soft and nontender with active bowel sounds.  EXTREMITIES:  There is no cyanosis, clubbing, or edema noted.  Distal  pulses are intact in all four extremities with slightly weak in the feet  with no bruits noted.  MUSCULOSKELETAL:  There is no joint deformity, effusions, and no spine  or CVA tenderness.  NEURO:  She is alert and oriented.  Cranial nerves II-XII grossly  intact.   Chest x-ray shows emphysema, but no acute disease.   Initial EKG:  AFib with RVR rate 137 with lateral T-wave changes that  are very similar to an EKG dated 2005.   Laboratory values are pending, but her CBC shows a hemoglobin of 14,  hematocrit 41.2, WBC 6.6, and platelets 199.  INR 0.9 and magnesium 2.4.   IMPRESSION:  Ms. Cornick was seen today by Dr. Eden Emms.  She is here  today with chest pain and palpitations.  Her EKG was abnormal in 2005,  and this is probably secondary to left ventricular hypertrophy.  We will  check an echocardiogram for regional wall motion abnormalities and do a  heart catheterization today for substernal chest pain.  She will be  continued on IV Cardizem at this time for her atrial fibrillation, and  once we have a more clear idea of what medicine she was taking at home,  these can be reevaluated and adjusted.  Since she is still having some  epigastric pain, we will give IV Protonix x1 and then use p.o.  Because  of self-described easy bleeding, we will check a Factor V Leiden,  bleeding time lupus anticoagulant, antiphospholipid antibodies, and Von  Willebrand factor.      Theodore Demark, PA-C      Noralyn Pick. Eden Emms, MD, [facility]  Electronically Signed    RB/MEDQ  D:  07/01/2008  T:  07/02/2008  Job:  [id number]

## 2011-04-25 NOTE — Discharge Summary (Signed)
Amanda Olsen, Amanda Olsen             ACCOUNT NO.:  [id number]   MEDICAL RECORD NO.:  [id number]          PATIENT TYPE:  INP   LOCATION:  3703                         FACILITY:  [facility]   PHYSICIAN:  Peter C. Eden Emms, MD, FACCDATE OF BIRTH:  [birth year]   DATE OF ADMISSION:  07/01/2008  DATE OF DISCHARGE:  07/06/2008                               DISCHARGE SUMMARY   PRIMARY CARDIOLOGIST:  Theron Arista C. Eden Emms, MD, [facility].   PRIMARY CARE PHYSICIAN:  Not listed.   PROCEDURES PERFORMED DURING HOSPITALIZATION:  Echocardiogram and cardiac  catheterization dated July 01, 2008. Overall left ventricular systolic  function was at lower limits of normal.  Left ventricular ejection  fraction was 50-55%.  Left ventricular wall thickness was upper limits  of normal.  Aortic valve mildly calcified.  Mild mitral regurgitation.  Left atrium was mildly dilated.  Right atrium was mildly dilated.   FINAL DISCHARGE DIAGNOSES:  1. Paroxysmal atrial fibrillation.      a.     Newly started on flecainide.   HOSPITAL COURSE:  This is a 77 year old Caucasian female with known  history of CAD who had an episode of dyspnea on exertion and  palpitations similar to which she experienced approximately 1 year ago.  The patient was on p.r.n. propranolol 40 mg.  The patient presented to  the emergency room secondary to increased symptoms with presyncope and  was found to be in atrial fibrillation with a rapid ventricular response  of 130 beats per minute.  The patient was started on IV Cardizem,  oxygen, and was given 4 baby aspirin.  The patient was seen and examined  by Theodore Demark, PA-C and Charlton Haws MD, and admitted for rate  control and to rule out ischemic etiology for event.  The patient had a  cardiac catheterization completed per Dr. Excell Seltzer on July 01, 2008.  Per  Dr. Excell Seltzer, the patient had minor nonobstructive LAD stenosis, normal  left circumflex and right coronary artery with hyperdynamic LV function  with  an EF of 75%.  The patient converted to normal sinus rhythm on  Cardizem and flecainide 100 mg b.i.d. was started.  The patient's blood  pressure and heart rate remained stable, but she felt very weak during  hospitalization.  She was also noted to have some bradycardia the days  following starting flecainide 100 b.i.d.  The patient's potassium was  mildly low at 3.4, and this was repleted.  Serial EKGs were completed,  and it was found that the patient's PR interval was increased to 0.214,  QRS to 102, and QTc was difficult to assess because of T-wave inversion.  The patient's flecainide was held for 24 hours on July 04, 2008, and  reinstituted at 50 mg b.i.d.  Follow up EKGs were completed to monitor  QT interval and PR interval.  EKG dated July 06, 2008, revealed a PR  interval of 0.20 and QTc of 0.466.  The patient's blood pressure was  stable without evidence of hypotension.  The patient's heart rate was in  the 70s.  Secondary to this, the patient was believed to be stable for  discharge after being seen by Dr. Charlton Haws.  The patient will be  discharged home on the flecainide with a followup appointment for a  stress test, treadmill only, and no nuclear evaluation to evaluate for  exercise-induced arrhythmias.  In the interim, the patient has been  advised to discontinue propranolol and sotalol, which she had been  taking at home and only takes flecainide 50 mg b.i.d.   DISCHARGE LABS:  Sodium 143, potassium 4.2, chloride 106, CO2 28,  glucose 91, BUN 9, creatinine 0.73.  Von Willebrand evaluation factor  895 with a reference range of 50-150, then von Willebrand antigen 93  with a range of 61-164.  Magnesium 2.1, cholesterol 130, triglycerides  87, HDL 42, LDL 71, hemoglobin 11.8, hematocrit 34.6, white blood cells  4.5, and platelets 124.   PHYSICAL EXAMINATION:  Blood pressure 125/80, heart rate 59,  respirations 18, temperature 97.3, and O2 sat 95% on room air.   Chest  x-ray dated July 01, 2008, emphysema without acute findings.   DISCHARGE MEDICATIONS:  Flecainide 50 mg twice a day.   ALLERGIES:  No known drug allergies.   FOLLOWUP PLANS AND APPOINTMENT:  1. The patient will have a stress test completed on July 30, 2008,      at 11:00 a.m. to evaluate for exercise-induced arrhythmias.  2. The patient will be seen by Dr. Eden Emms at the same time for further      recommendations based on stress test results and his post      hospitalization evaluation.  3. The patient has been given prescription for flecainide 50 mg twice      a day.  4. The patient has been advised to call our office for any recurrence      of symptoms.   Time spent with the patient to include physician time 35 minutes.      [name], NP      Noralyn Pick. Eden Emms, MD, [facility]  Electronically Signed    KML/MEDQ  D:  07/06/2008  T:  07/07/2008  Job:  [id number]

## 2011-05-04 ENCOUNTER — Encounter: Payer: Self-pay | Admitting: Cardiovascular Disease

## 2011-05-11 ENCOUNTER — Ambulatory Visit (INDEPENDENT_AMBULATORY_CARE_PROVIDER_SITE_OTHER): Payer: Medicare Other | Admitting: Cardiovascular Disease

## 2011-05-11 ENCOUNTER — Encounter: Payer: Self-pay | Admitting: Cardiovascular Disease

## 2011-05-11 DIAGNOSIS — I10 Essential (primary) hypertension: Secondary | ICD-10-CM

## 2011-05-11 DIAGNOSIS — I4891 Unspecified atrial fibrillation: Secondary | ICD-10-CM

## 2011-05-11 NOTE — Patient Instructions (Signed)
Your physician recommends that you schedule a follow-up appointment in: 6 MONTHS WITH DR NISHAN  Your physician recommends that you continue on your current medications as directed. Please refer to the Current Medication list given to you today. 

## 2011-05-11 NOTE — Progress Notes (Signed)
Amanda Olsen is seen today for F/U of HTN and PAF.  She has maintained NSR on Flecainide.  She did not have proarrythmia on ETT.  She has good LV function and no CAD by cath.   Her last visit we started ACE for BP contorl.    She sold her apartment there and is living independanlty in an apartment in [place].  She denies SSCP, palpitations, syncope, edema or dyspnea.  I reviewed lab work from 04/08/10 and her K was 3.9 with Cr .7 and normal LFTS.  LDL was 103.  She has a component of white coat hypertension.  Has son and daughter in law with two grandchildren in [place] but misses [place] where she is from.  Does not drive  ROS: Denies fever, malais, weight loss, blurry vision, decreased visual acuity, cough, sputum, SOB, hemoptysis, pleuritic pain, palpitaitons, heartburn, abdominal pain, melena, lower extremity edema, claudication, or rash.  All other systems reviewed and negative  General: Affect appropriate Healthy:  appears stated age HEENT: normal Neck supple with no adenopathy JVP normal no bruits no thyromegaly Lungs clear with no wheezing and good diaphragmatic motion Heart:  S1/S2 no murmur,rub, gallop or click PMI normal Abdomen: benighn, BS positve, no tenderness, no AAA no bruit.  No HSM or HJR Distal pulses intact with no bruits No edema Neuro non-focal Skin warm and dry No muscular weakness   Current Outpatient Prescriptions  Medication Sig Dispense Refill  . aspirin 325 MG EC tablet Take 325 mg by mouth daily.        . calcium carbonate (OS-CAL) 600 MG TABS Take 600 mg by mouth daily.        . Cholecalciferol (VITAMIN D) 2000 UNITS tablet Take 2,000 Units by mouth daily.        . flecainide (TAMBOCOR) 100 MG tablet Take 100 mg by mouth 2 (two) times daily.        Marland Kitchen levothyroxine (SYNTHROID, LEVOTHROID) 25 MCG tablet Take 25 mcg by mouth daily.        Marland Kitchen lisinopril (PRINIVIL,ZESTRIL) 10 MG tablet Take 5 mg by mouth daily.       . zoledronic acid (RECLAST) 5  MG/100ML SOLN Inject 5 mg into the vein as directed.          Allergies  Review of patient's allergies indicates no known allergies.  Electrocardiogram:  Assessment and Plan

## 2011-05-11 NOTE — Assessment & Plan Note (Signed)
Well controlled.  Continue current medications and low sodium Dash type diet.    

## 2011-05-11 NOTE — Assessment & Plan Note (Signed)
Maint NSR  Continue Felcainide

## 2011-05-22 ENCOUNTER — Other Ambulatory Visit: Payer: Self-pay | Admitting: Cardiovascular Disease

## 2011-07-01 ENCOUNTER — Other Ambulatory Visit: Payer: Self-pay | Admitting: Cardiovascular Disease

## 2011-07-05 ENCOUNTER — Other Ambulatory Visit: Payer: Self-pay | Admitting: *Deleted

## 2011-07-05 MED ORDER — LISINOPRIL 10 MG PO TABS: 5.0000 mg | ORAL_TABLET | Freq: Every day | ORAL | Status: DC

## 2011-09-03 ENCOUNTER — Other Ambulatory Visit: Payer: Self-pay | Admitting: Cardiovascular Disease

## 2011-09-08 LAB — LIPID PANEL
Cholesterol: 130
HDL: 42
LDL Cholesterol: 71
Total CHOL/HDL Ratio: 3.1
Triglycerides: 87
VLDL: 17

## 2011-09-08 LAB — APTT: aPTT: 27

## 2011-09-08 LAB — COMPREHENSIVE METABOLIC PANEL
ALT: 58 — ABNORMAL HIGH
AST: 79 — ABNORMAL HIGH
Albumin: 4.1
Alkaline Phosphatase: 63
BUN: 14
CO2: 23
Calcium: 9.2
Chloride: 107
Creatinine, Ser: 0.81
GFR calc Af Amer: >60
GFR calc non Af Amer: >60
Glucose, Bld: 169 — ABNORMAL HIGH
Potassium: 4.3
Sodium: 141
Total Bilirubin: 1.1
Total Protein: 7.4

## 2011-09-08 LAB — ANTIPHOSPHOLIPID SYNDROME EVAL, BLD
Anticardiolipin IgA: <7 — ABNORMAL LOW (ref <13)
Anticardiolipin IgG: <7 — ABNORMAL LOW (ref <11)
Anticardiolipin IgM: <7 — ABNORMAL LOW (ref <10)
Antiphosphatidylserine IgA: <20 APS U/mL (ref <20.0)
Antiphosphatidylserine IgG: <11 GPS U/mL (ref <11.0)
Antiphosphatidylserine IgM: <25 MPS U/mL (ref <25.0)
DRVVT: 34.3 — ABNORMAL LOW (ref 36.1–47.0)
Lupus Anticoagulant: NOT DETECTED
PTT Lupus Anticoagulant: 34.4 — ABNORMAL LOW (ref 36.3–48.8)

## 2011-09-08 LAB — CBC
HCT: 34.6 — ABNORMAL LOW
HCT: 41.2
Hemoglobin: 11.8 — ABNORMAL LOW
Hemoglobin: 14
MCHC: 33.9
MCHC: 34.1
MCV: 109.7 — ABNORMAL HIGH
MCV: 110.5 — ABNORMAL HIGH
Platelets: 124 — ABNORMAL LOW
Platelets: 199
RBC: 3.13 — ABNORMAL LOW
RBC: 3.76 — ABNORMAL LOW
RDW: 14.8
RDW: 14.9
WBC: 4.5
WBC: 6.6

## 2011-09-08 LAB — T4, FREE: Free T4: 1.03

## 2011-09-08 LAB — FACTOR 5 LEIDEN

## 2011-09-08 LAB — CARDIAC PANEL(CRET KIN+CKTOT+MB+TROPI)
CK, MB: 1.5
CK, MB: 1.7
Relative Index: INVALID
Relative Index: INVALID
Total CK: 55
Total CK: 60
Troponin I: 0.01
Troponin I: <0.01

## 2011-09-08 LAB — VON WILLEBRAND PANEL
Factor-VIII Activity: 95 % (ref 50–150)
Ristocetin Co-Factor: 91 % (ref 50–150)
Von Willebrand Ag: 93 % normal (ref 61–164)

## 2011-09-08 LAB — DIFFERENTIAL
Basophils Absolute: 0
Basophils Relative: 0
Eosinophils Absolute: 0.1
Eosinophils Relative: 1
Lymphocytes Relative: 18
Lymphs Abs: 1.2
Monocytes Absolute: 0.3
Monocytes Relative: 5
Neutro Abs: 5
Neutrophils Relative %: 75

## 2011-09-08 LAB — PROTIME-INR
INR: 0.9
Prothrombin Time: 12.6

## 2011-09-08 LAB — BASIC METABOLIC PANEL
BUN: 10
BUN: 9
CO2: 24
CO2: 28
Calcium: 8.3 — ABNORMAL LOW
Calcium: 9.1
Chloride: 106
Chloride: 110
Creatinine, Ser: 0.67
Creatinine, Ser: 0.73
GFR calc Af Amer: >60
GFR calc Af Amer: >60
GFR calc non Af Amer: >60
GFR calc non Af Amer: >60
Glucose, Bld: 91
Glucose, Bld: 99
Potassium: 3.4 — ABNORMAL LOW
Potassium: 4.2
Sodium: 142
Sodium: 143

## 2011-09-08 LAB — TSH: TSH: 6.768 — ABNORMAL HIGH

## 2011-09-08 LAB — MAGNESIUM
Magnesium: 2.1
Magnesium: 2.4

## 2011-09-08 LAB — CK TOTAL AND CKMB (NOT AT ARMC)
CK, MB: 1.9
Relative Index: 1.9
Total CK: 102

## 2011-09-08 LAB — PLATELET COUNT: Platelets: 135 — ABNORMAL LOW

## 2011-09-08 LAB — TROPONIN I: Troponin I: <0.01

## 2011-09-08 LAB — BLEEDING TIME: Bleeding Time: 8

## 2011-11-14 ENCOUNTER — Encounter: Payer: Self-pay | Admitting: *Deleted

## 2011-11-16 ENCOUNTER — Encounter: Payer: Self-pay | Admitting: Cardiovascular Disease

## 2011-11-16 ENCOUNTER — Ambulatory Visit (INDEPENDENT_AMBULATORY_CARE_PROVIDER_SITE_OTHER): Payer: Medicare Other | Admitting: Cardiovascular Disease

## 2011-11-16 DIAGNOSIS — E039 Hypothyroidism, unspecified: Secondary | ICD-10-CM

## 2011-11-16 DIAGNOSIS — I10 Essential (primary) hypertension: Secondary | ICD-10-CM

## 2011-11-16 DIAGNOSIS — I4891 Unspecified atrial fibrillation: Secondary | ICD-10-CM

## 2011-11-16 NOTE — Patient Instructions (Signed)
Your physician wants you to follow-up in: YEAR WITH DR NISHAN  You will receive a reminder letter in the mail two months in advance. If you don't receive a letter, please call our office to schedule the follow-up appointment.  Your physician recommends that you continue on your current medications as directed. Please refer to the Current Medication list given to you today. 

## 2011-11-16 NOTE — Assessment & Plan Note (Signed)
TSH in normal range continue replacement Rx

## 2011-11-16 NOTE — Progress Notes (Signed)
Patient ID: Amanda Olsen, female   DOB: 1934-11-01, 77 y.o.   MRN: [id number] Amanda Olsen is seen today for F/U of HTN and PAF. She has maintained NSR on Flecainide. She did not have proarrythmia on ETT. She has good LV function and no CAD by cath. Her last visit we started ACE for BP contorl. She sold her apartment there and is living independanlty in an apartment in [place]. She denies SSCP, palpitations, syncope, edema or dyspnea. I reviewed lab work from 04/08/10 and her K was 3.9 with Cr .7 and normal LFTS. LDL was 103. She has a component of white coat hypertension. Has son and daughter in law with two grandchildren in [place] but misses [place] where she is from. Does not dri  ROS: Denies fever, malais, weight loss, blurry vision, decreased visual acuity, cough, sputum, SOB, hemoptysis, pleuritic pain, palpitaitons, heartburn, abdominal pain, melena, lower extremity edema, claudication, or rash.  All other systems reviewed and negative  General: Affect appropriate Healthy:  appears stated age HEENT: normal Neck supple with no adenopathy JVP normal no bruits no thyromegaly Lungs clear with no wheezing and good diaphragmatic motion Heart:  S1/S2 no murmur,rub, gallop or click PMI normal Abdomen: benighn, BS positve, no tenderness, no AAA no bruit.  No HSM or HJR Distal pulses intact with no bruits No edema Neuro non-focal Skin warm and dry No muscular weakness   Current Outpatient Prescriptions  Medication Sig Dispense Refill  . aspirin 325 MG EC tablet Take 325 mg by mouth daily.        . calcium carbonate (OS-CAL) 600 MG TABS Take 600 mg by mouth daily.        . Cholecalciferol (VITAMIN D) 2000 UNITS tablet Take 2,000 Units by mouth daily.        . flecainide (TAMBOCOR) 100 MG tablet Take 100 mg by mouth 2 (two) times daily.        Marland Kitchen levothyroxine (SYNTHROID, LEVOTHROID) 25 MCG tablet Take 25 mcg by mouth daily.        Marland Kitchen lisinopril (PRINIVIL,ZESTRIL) 10 MG tablet TAKE  1/2 TABLET BY MOUTH EVERY DAY  30 tablet  6  . zoledronic acid (RECLAST) 5 MG/100ML SOLN Inject 5 mg into the vein as directed.          Allergies  Review of patient's allergies indicates no known allergies.  Outside labs reviewed:  08/10/11  CR .9, K 4.4, HDL 75, LDL 127, TSH 2.7 normal LFT;s  Assessment and Plan

## 2011-11-16 NOTE — Assessment & Plan Note (Signed)
Well controlled.  Continue current medications and low sodium Dash type diet.   Some white coat component.  Sometimes only needs 5mg  of lisinopril at home

## 2011-11-16 NOTE — Assessment & Plan Note (Signed)
Maint NSR continue Felcainide

## 2012-01-22 ENCOUNTER — Other Ambulatory Visit: Payer: Self-pay | Admitting: Cardiovascular Disease

## 2012-02-08 DIAGNOSIS — E559 Vitamin D deficiency, unspecified: Secondary | ICD-10-CM | POA: Diagnosis not present

## 2012-02-08 DIAGNOSIS — Z23 Encounter for immunization: Secondary | ICD-10-CM | POA: Diagnosis not present

## 2012-02-08 DIAGNOSIS — M810 Age-related osteoporosis without current pathological fracture: Secondary | ICD-10-CM | POA: Diagnosis not present

## 2012-02-08 DIAGNOSIS — I10 Essential (primary) hypertension: Secondary | ICD-10-CM | POA: Diagnosis not present

## 2012-02-08 DIAGNOSIS — Z0000 Encounter for general adult medical examination without abnormal findings: Secondary | ICD-10-CM | POA: Diagnosis not present

## 2012-02-15 DIAGNOSIS — I10 Essential (primary) hypertension: Secondary | ICD-10-CM | POA: Diagnosis not present

## 2012-02-15 DIAGNOSIS — E039 Hypothyroidism, unspecified: Secondary | ICD-10-CM | POA: Diagnosis not present

## 2012-02-15 DIAGNOSIS — M810 Age-related osteoporosis without current pathological fracture: Secondary | ICD-10-CM | POA: Diagnosis not present

## 2012-02-15 DIAGNOSIS — I4891 Unspecified atrial fibrillation: Secondary | ICD-10-CM | POA: Diagnosis not present

## 2012-06-25 ENCOUNTER — Telehealth: Payer: Self-pay | Admitting: Cardiovascular Disease

## 2012-06-25 MED ORDER — LISINOPRIL 10 MG PO TABS: 10.0000 mg | ORAL_TABLET | Freq: Every day | ORAL | Status: DC

## 2012-06-25 NOTE — Telephone Encounter (Signed)
Ok to take 20 mg of lisinopril

## 2012-06-25 NOTE — Telephone Encounter (Signed)
Please return call to vera-grandaughter  [phone] to discuss patient medications

## 2012-06-25 NOTE — Telephone Encounter (Signed)
PT'S GRANDDAUGHTER AWARE OKAY FOR PT TO TAKE LISINOPRIL 10 MG EVERY DAY  REFILL  SENT VIA EPIC .Zack Seal

## 2012-06-25 NOTE — Telephone Encounter (Signed)
SPOKE WITH PT'S GRANDDAUGHTER   B/P HAS BEEN GOING UP  HAS BEEN TAKING LISINOPRIL 10 MG EVERY DAY INSTEAD OF 5 MG WOULD LIKE TO INCREASE  AWARE WILL FORWARD TO DR Eden Emms FOR REVIEW  PER PT PENDING  B/P  READINGS  MAY TAKE 5 MG OR 10 MG ./CY

## 2012-08-15 DIAGNOSIS — M810 Age-related osteoporosis without current pathological fracture: Secondary | ICD-10-CM | POA: Diagnosis not present

## 2012-08-15 DIAGNOSIS — I10 Essential (primary) hypertension: Secondary | ICD-10-CM | POA: Diagnosis not present

## 2012-08-15 DIAGNOSIS — E039 Hypothyroidism, unspecified: Secondary | ICD-10-CM | POA: Diagnosis not present

## 2012-08-15 DIAGNOSIS — I4891 Unspecified atrial fibrillation: Secondary | ICD-10-CM | POA: Diagnosis not present

## 2012-08-22 DIAGNOSIS — M810 Age-related osteoporosis without current pathological fracture: Secondary | ICD-10-CM | POA: Diagnosis not present

## 2012-08-22 DIAGNOSIS — I4891 Unspecified atrial fibrillation: Secondary | ICD-10-CM | POA: Diagnosis not present

## 2012-08-22 DIAGNOSIS — I10 Essential (primary) hypertension: Secondary | ICD-10-CM | POA: Diagnosis not present

## 2012-08-22 DIAGNOSIS — E039 Hypothyroidism, unspecified: Secondary | ICD-10-CM | POA: Diagnosis not present

## 2012-09-14 ENCOUNTER — Encounter (HOSPITAL_COMMUNITY): Payer: Self-pay | Admitting: Emergency Medicine

## 2012-09-14 ENCOUNTER — Emergency Department (HOSPITAL_COMMUNITY): Payer: Medicare Other

## 2012-09-14 ENCOUNTER — Emergency Department (HOSPITAL_COMMUNITY)
Admission: EM | Admit: 2012-09-14 | Discharge: 2012-09-14 | Disposition: A | Discharge status: Home / Self Care | Payer: Medicare Other | Attending: Emergency Medicine | Admitting: Emergency Medicine

## 2012-09-14 DIAGNOSIS — Z79899 Other long term (current) drug therapy: Secondary | ICD-10-CM | POA: Insufficient documentation

## 2012-09-14 DIAGNOSIS — J9819 Other pulmonary collapse: Secondary | ICD-10-CM | POA: Diagnosis not present

## 2012-09-14 DIAGNOSIS — R000 Tachycardia, unspecified: Secondary | ICD-10-CM | POA: Diagnosis not present

## 2012-09-14 DIAGNOSIS — R5383 Other fatigue: Secondary | ICD-10-CM | POA: Diagnosis not present

## 2012-09-14 DIAGNOSIS — I10 Essential (primary) hypertension: Secondary | ICD-10-CM | POA: Diagnosis not present

## 2012-09-14 DIAGNOSIS — I4891 Unspecified atrial fibrillation: Secondary | ICD-10-CM | POA: Diagnosis not present

## 2012-09-14 DIAGNOSIS — R002 Palpitations: Secondary | ICD-10-CM | POA: Diagnosis not present

## 2012-09-14 DIAGNOSIS — J984 Other disorders of lung: Secondary | ICD-10-CM | POA: Diagnosis not present

## 2012-09-14 DIAGNOSIS — R5381 Other malaise: Secondary | ICD-10-CM | POA: Insufficient documentation

## 2012-09-14 DIAGNOSIS — Z7982 Long term (current) use of aspirin: Secondary | ICD-10-CM | POA: Diagnosis not present

## 2012-09-14 DIAGNOSIS — R531 Weakness: Secondary | ICD-10-CM

## 2012-09-14 LAB — GRAM STAIN

## 2012-09-14 LAB — COMPREHENSIVE METABOLIC PANEL
ALT: 20 U/L (ref 0–35)
AST: 25 U/L (ref 0–37)
Albumin: 4.3 g/dL (ref 3.5–5.2)
Alkaline Phosphatase: 60 U/L (ref 39–117)
BUN: 26 mg/dL — ABNORMAL HIGH (ref 6–23)
CO2: 21 mEq/L (ref 19–32)
Calcium: 9.5 mg/dL (ref 8.4–10.5)
Chloride: 98 mEq/L (ref 96–112)
Creatinine, Ser: 0.69 mg/dL (ref 0.50–1.10)
GFR calc Af Amer: >90 mL/min (ref >90)
GFR calc non Af Amer: 82 mL/min — ABNORMAL LOW (ref >90)
Glucose, Bld: 151 mg/dL — ABNORMAL HIGH (ref 70–99)
Potassium: 4 mEq/L (ref 3.5–5.1)
Sodium: 131 mEq/L — ABNORMAL LOW (ref 135–145)
Total Bilirubin: 0.7 mg/dL (ref 0.3–1.2)
Total Protein: 8 g/dL (ref 6.0–8.3)

## 2012-09-14 LAB — CBC WITH DIFFERENTIAL/PLATELET
Basophils Absolute: 0 10*3/uL (ref 0.0–0.1)
Basophils Relative: 0 % (ref 0–1)
Eosinophils Absolute: 0.1 10*3/uL (ref 0.0–0.7)
Eosinophils Relative: 1 % (ref 0–5)
HCT: 43.6 % (ref 36.0–46.0)
Hemoglobin: 15 g/dL (ref 12.0–15.0)
Lymphocytes Relative: 4 % — ABNORMAL LOW (ref 12–46)
Lymphs Abs: 0.3 10*3/uL — ABNORMAL LOW (ref 0.7–4.0)
MCH: 34.2 pg — ABNORMAL HIGH (ref 26.0–34.0)
MCHC: 34.4 g/dL (ref 30.0–36.0)
MCV: 99.5 fL (ref 78.0–100.0)
Monocytes Absolute: 0.2 10*3/uL (ref 0.1–1.0)
Monocytes Relative: 2 % — ABNORMAL LOW (ref 3–12)
Neutro Abs: 7.5 10*3/uL (ref 1.7–7.7)
Neutrophils Relative %: 93 % — ABNORMAL HIGH (ref 43–77)
Platelets: 137 10*3/uL — ABNORMAL LOW (ref 150–400)
RBC: 4.38 MIL/uL (ref 3.87–5.11)
RDW: 13.4 % (ref 11.5–15.5)
WBC: 8.1 10*3/uL (ref 4.0–10.5)

## 2012-09-14 LAB — URINALYSIS, ROUTINE W REFLEX MICROSCOPIC
Glucose, UA: NEGATIVE mg/dL
Ketones, ur: 15 mg/dL — AB
Nitrite: NEGATIVE
Protein, ur: NEGATIVE mg/dL
Specific Gravity, Urine: 1.033 — ABNORMAL HIGH (ref 1.005–1.030)
Urobilinogen, UA: 0.2 mg/dL (ref 0.0–1.0)
pH: 5.5 (ref 5.0–8.0)

## 2012-09-14 LAB — URINE MICROSCOPIC-ADD ON

## 2012-09-14 LAB — POCT I-STAT TROPONIN I: Troponin i, poc: 0 ng/mL (ref 0.00–0.08)

## 2012-09-14 LAB — D-DIMER, QUANTITATIVE: D-Dimer, Quant: 1 ug/mL-FEU — ABNORMAL HIGH (ref 0.00–0.48)

## 2012-09-14 MED ORDER — IOHEXOL 350 MG/ML SOLN
100.0000 mL | Freq: Once | INTRAVENOUS | Status: AC | PRN
  Administered 2012-09-14: 100 mL via INTRAVENOUS

## 2012-09-14 MED ORDER — SODIUM CHLORIDE 0.9 % IV BOLUS (SEPSIS)
1000.0000 mL | Freq: Once | INTRAVENOUS | Status: AC
  Administered 2012-09-14: 1000 mL via INTRAVENOUS

## 2012-09-14 NOTE — ED Provider Notes (Signed)
History     CSN: [id number]  Arrival date & time 09/14/12  1627   First MD Initiated Contact with Patient 09/14/12 1704      Chief Complaint  Patient presents with  . Tachycardia  . Nausea    (Consider location/radiation/quality/duration/timing/severity/associated sxs/prior treatment) Patient is a 78 y.o. female presenting with palpitations. The history is provided by the patient and a relative.  Palpitations  This is a recurrent problem. The current episode started 6 to 12 hours ago. The problem occurs constantly. The problem has not changed since onset.The problem is associated with exercise. Associated symptoms include malaise/fatigue and irregular heartbeat. Pertinent negatives include no diaphoresis, no fever, no chest pain, no chest pressure, no exertional chest pressure, no syncope, no abdominal pain, no nausea, no vomiting, no headaches, no cough and no shortness of breath. She has tried nothing for the symptoms. The treatment provided no relief. Her past medical history is significant for heart disease.    Past Medical History  Diagnosis Date  . Atrial fibrillation   . Cataract   . HTN (hypertension)   . Rosacea   . ELECTROCARDIOGRAM, ABNORMAL     Past Surgical History  Procedure Date  . Varicose vein surgery     No family history on file.  History  Substance Use Topics  . Smoking status: Never Smoker   . Smokeless tobacco: Not on file  . Alcohol Use: No    OB History    Grav Para Term Preterm Abortions TAB SAB Ect Mult Living                  Review of Systems  Constitutional: Positive for malaise/fatigue. Negative for fever and diaphoresis.  Respiratory: Negative for cough and shortness of breath.   Cardiovascular: Positive for palpitations. Negative for chest pain and syncope.  Gastrointestinal: Negative for nausea, vomiting, abdominal pain and diarrhea.  Neurological: Negative for headaches.  All other systems reviewed and are  negative.    Allergies  Review of patient's allergies indicates no known allergies.  Home Medications   Current Outpatient Rx  Name Route Sig Dispense Refill  . ASPIRIN 325 MG PO TBEC Oral Take 325 mg by mouth daily.      Marland Kitchen CALCIUM CARBONATE 600 MG PO TABS Oral Take 600 mg by mouth daily.      Marland Kitchen VITAMIN D 2000 UNITS PO TABS Oral Take 2,000 Units by mouth daily.      Marland Kitchen FLECAINIDE ACETATE 100 MG PO TABS  TAKE 1 TABLET BY MOUTH EVERY 12 HOURS 60 tablet 11  . LEVOTHYROXINE SODIUM 25 MCG PO TABS Oral Take 25 mcg by mouth daily.      Marland Kitchen LISINOPRIL 10 MG PO TABS Oral Take 1 tablet (10 mg total) by mouth daily. 30 tablet 11  . ZOLEDRONIC ACID 5 MG/100ML IV SOLN Intravenous Inject 5 mg into the vein as directed.        BP 100/72  Pulse 92  Temp 98.4 F (36.9 C) (Oral)  Resp 16  Ht 5\' 6"  (1.676 m)  SpO2 94%  Physical Exam  Nursing note and vitals reviewed. Constitutional: She is oriented to person, place, and time. She appears well-developed and well-nourished. No distress.  HENT:  Head: Normocephalic and atraumatic.  Eyes: EOM are normal. Pupils are equal, round, and reactive to light.  Neck: Normal range of motion.  Cardiovascular: Normal rate and normal heart sounds.   Pulmonary/Chest: Effort normal and breath sounds normal. No respiratory distress.  Abdominal:  Soft. She exhibits no distension. There is no tenderness.  Musculoskeletal: Normal range of motion.  Neurological: She is alert and oriented to person, place, and time.  Skin: Skin is warm and dry.    ED Course  Procedures (including critical care time)  Labs Reviewed  CBC WITH DIFFERENTIAL - Abnormal; Notable for the following:    MCH 34.2 (*)     Platelets 137 (*)     Neutrophils Relative 93 (*)     Lymphocytes Relative 4 (*)     Lymphs Abs 0.3 (*)     Monocytes Relative 2 (*)     All other components within normal limits  COMPREHENSIVE METABOLIC PANEL   No results found.  Date: 09/14/2012  Rate: 93   Rhythm: normal sinus rhythm  QRS Axis: normal  Intervals: PR prolonged  ST/T Wave abnormalities: normal  Conduction Disutrbances:first-degree A-V block  and left bundle branch block  Narrative Interpretation: sinus with LBBB  Old EKG Reviewed: unchanged     1. Weakness       MDM  5:16 PM Pt seen and examined. Pt with onset of heart racing after a walk this morning. Pt with history of Afib on flecanide, but does not appear to be in this rhythm at this time. Will get labs and EKG.   D-dimer is elevated. Will get CTA PE study. Remainder of workup unremarkable.  9:58 PM CTA PE study negative. Patient feeling better, so will discharge. Pt advised to follow with her doctor to check her thyroid as it appears patient has a history of hypothyroidism. Do not feel that urine is indicating UTI, feel this is contamination.  Daleen Bo, MD 09/15/12 0100

## 2012-09-14 NOTE — ED Notes (Signed)
Pt reports heart racing feeling in her chest at 0800. Pt of Dr. Juliann Pares, family concerned that pt may be in fibrillation.

## 2012-09-14 NOTE — ED Notes (Signed)
Pt stated that she her HR at home  Was increased to the 90s. She stated that HR is normally in the 60s. She also has been having generalized weakness since this morning. She has been nauseated but no vomiting. No chest pain. No SOB. No neurological deficits. Will continue to monitor.

## 2012-09-16 LAB — URINE CULTURE
Colony Count: NO GROWTH
Culture: NO GROWTH

## 2012-09-16 NOTE — ED Provider Notes (Signed)
I saw and evaluated the patient, reviewed the resident's note and I agree with the findings and plan.  Hx afib on flecainide, felt palpitations and racing heart after walking. Patient thinks 90s, normally 53s.  NSR here.  No chest pain or SOB.  Glynn Octave, MD 09/16/12 1334

## 2012-10-11 DIAGNOSIS — Z23 Encounter for immunization: Secondary | ICD-10-CM | POA: Diagnosis not present

## 2012-11-21 ENCOUNTER — Ambulatory Visit (INDEPENDENT_AMBULATORY_CARE_PROVIDER_SITE_OTHER): Payer: Medicare Other | Admitting: Cardiovascular Disease

## 2012-11-21 ENCOUNTER — Encounter: Payer: Self-pay | Admitting: Cardiovascular Disease

## 2012-11-21 VITALS — BP 140/80 | HR 65 | Wt 154.0 lb

## 2012-11-21 DIAGNOSIS — I10 Essential (primary) hypertension: Secondary | ICD-10-CM

## 2012-11-21 DIAGNOSIS — I4891 Unspecified atrial fibrillation: Secondary | ICD-10-CM | POA: Diagnosis not present

## 2012-11-21 DIAGNOSIS — E039 Hypothyroidism, unspecified: Secondary | ICD-10-CM | POA: Diagnosis not present

## 2012-11-21 DIAGNOSIS — R9431 Abnormal electrocardiogram [ECG] [EKG]: Secondary | ICD-10-CM | POA: Diagnosis not present

## 2012-11-21 NOTE — Assessment & Plan Note (Signed)
LBBB stable no AV block Consider echo in a year

## 2012-11-21 NOTE — Assessment & Plan Note (Signed)
Maint NSR continue flecainide 

## 2012-11-21 NOTE — Progress Notes (Signed)
Patient ID: Beyla Loney, female   DOB: [birth year], 78 y.o.   MRN: [id number] Katalyn is seen today for F/U of HTN and PAF. She has maintained NSR on Flecainide. She did not have proarrythmia on ETT. She has good LV function and no CAD by cath. Her last visit we started ACE for BP contorl. She sold her apartment there and is living independanlty in an apartment in [place]. She denies SSCP, palpitations, syncope, edema or dyspnea. I reviewed lab work from 04/08/10 and her K was 3.9 with Cr .7 and normal LFTS. LDL was 103. She has a component of white coat hypertension. Has son and daughter in law with two grandchildren in [place] but misses [place] where she is from. Does not drive  Seen in ER 11/91 for palpitations but in NSR.  Has white coat syndrome and home BP is in the 120 systolic range   ROS: Denies fever, malais, weight loss, blurry vision, decreased visual acuity, cough, sputum, SOB, hemoptysis, pleuritic pain, palpitaitons, heartburn, abdominal pain, melena, lower extremity edema, claudication, or rash.  All other systems reviewed and negative  General: Affect appropriate Healthy:  appears stated age HEENT: normal Neck supple with no adenopathy JVP normal no bruits no thyromegaly Lungs clear with no wheezing and good diaphragmatic motion Heart:  S1/S2 no murmur, no rub, gallop or click PMI normal Abdomen: benighn, BS positve, no tenderness, no AAA no bruit.  No HSM or HJR Distal pulses intact with no bruits No edema Neuro non-focal Skin warm and dry No muscular weakness   Current Outpatient Prescriptions  Medication Sig Dispense Refill  . aspirin EC 81 MG tablet Take 81 mg by mouth daily.      . calcium carbonate (OS-CAL) 600 MG TABS Take 600 mg by mouth daily.        . Cholecalciferol (VITAMIN D) 2000 UNITS tablet Take 2,000 Units by mouth daily.        . flecainide (TAMBOCOR) 100 MG tablet TAKE 1 TABLET BY MOUTH EVERY 12 HOURS  60 tablet  11  . lisinopril  (PRINIVIL,ZESTRIL) 10 MG tablet Take 1 tablet (10 mg total) by mouth daily.  30 tablet  11    Allergies  Review of patient's allergies indicates no known allergies.  Electrocardiogram:  09/14/12  SR rate 90 LBBB QT 400   Assessment and Plan

## 2012-11-21 NOTE — Assessment & Plan Note (Signed)
Controlled by home readings  Continue current dose of ACe

## 2012-11-21 NOTE — Assessment & Plan Note (Signed)
TSH normal F/U primary

## 2012-11-21 NOTE — Patient Instructions (Signed)
Your physician wants you to follow-up in:  6 MONTHS WITH DR NISHAN  You will receive a reminder letter in the mail two months in advance. If you don't receive a letter, please call our office to schedule the follow-up appointment. Your physician recommends that you continue on your current medications as directed. Please refer to the Current Medication list given to you today. 

## 2012-12-02 ENCOUNTER — Telehealth: Payer: Self-pay | Admitting: Cardiovascular Disease

## 2012-12-02 NOTE — Telephone Encounter (Signed)
Pt's device making BP monitor unable to read  her BP , needs rx order for a monitor that can read BP even with a device, mail to pt, call if can send

## 2012-12-02 NOTE — Telephone Encounter (Signed)
SCRIPT SENT TO  PT , READING , WATCH B/P HOME OR  OMRON M6  PT IS OUT OF RHYTHM  NEEDS ELECTRONIC CUFF TO CHECK B/PNOT SURE IF THOSE  CUFFS WILL READ IF  HEART RATE IRREG AND FAST  .Amanda Olsen

## 2013-01-28 ENCOUNTER — Telehealth: Payer: Self-pay | Admitting: *Deleted

## 2013-01-28 MED ORDER — FLECAINIDE ACETATE 100 MG PO TABS: 100.0000 mg | ORAL_TABLET | Freq: Two times a day (BID) | ORAL | Status: DC

## 2013-01-28 NOTE — Telephone Encounter (Signed)
Patient son calling because [facility] said they send a fax over for refill of Flecainide and told son to call us for refill information on why we have not responded. There is no refill in escribe for this medication but records show that it was filled last year this time for one year. Patient was seen in Dec and was told to remain on same medications.  Refill for flecainide 100mg  bid is sent to [facility] with 2 mouth refill. Son ok'd and said thank you   Micki Riley, CMA

## 2013-02-17 ENCOUNTER — Encounter: Payer: Self-pay | Admitting: Cardiovascular Disease

## 2013-02-17 DIAGNOSIS — I4891 Unspecified atrial fibrillation: Secondary | ICD-10-CM | POA: Diagnosis not present

## 2013-02-17 DIAGNOSIS — I10 Essential (primary) hypertension: Secondary | ICD-10-CM | POA: Diagnosis not present

## 2013-02-17 DIAGNOSIS — E039 Hypothyroidism, unspecified: Secondary | ICD-10-CM | POA: Diagnosis not present

## 2013-02-17 DIAGNOSIS — M810 Age-related osteoporosis without current pathological fracture: Secondary | ICD-10-CM | POA: Diagnosis not present

## 2013-02-20 DIAGNOSIS — M810 Age-related osteoporosis without current pathological fracture: Secondary | ICD-10-CM | POA: Diagnosis not present

## 2013-02-20 DIAGNOSIS — I4891 Unspecified atrial fibrillation: Secondary | ICD-10-CM | POA: Diagnosis not present

## 2013-02-20 DIAGNOSIS — E039 Hypothyroidism, unspecified: Secondary | ICD-10-CM | POA: Diagnosis not present

## 2013-02-20 DIAGNOSIS — I10 Essential (primary) hypertension: Secondary | ICD-10-CM | POA: Diagnosis not present

## 2013-03-03 DIAGNOSIS — H40019 Open angle with borderline findings, low risk, unspecified eye: Secondary | ICD-10-CM | POA: Diagnosis not present

## 2013-03-03 DIAGNOSIS — Z961 Presence of intraocular lens: Secondary | ICD-10-CM | POA: Diagnosis not present

## 2013-03-03 DIAGNOSIS — H18599 Other hereditary corneal dystrophies, unspecified eye: Secondary | ICD-10-CM | POA: Diagnosis not present

## 2013-03-03 DIAGNOSIS — H353190 Nonexudative age-related macular degeneration, unspecified eye, stage unspecified: Secondary | ICD-10-CM | POA: Diagnosis not present

## 2013-04-07 DIAGNOSIS — H40019 Open angle with borderline findings, low risk, unspecified eye: Secondary | ICD-10-CM | POA: Diagnosis not present

## 2013-04-23 ENCOUNTER — Other Ambulatory Visit: Payer: Self-pay

## 2013-04-23 MED ORDER — FLECAINIDE ACETATE 100 MG PO TABS: 100.0000 mg | ORAL_TABLET | Freq: Two times a day (BID) | ORAL | Status: DC

## 2013-05-28 ENCOUNTER — Other Ambulatory Visit: Payer: Self-pay | Admitting: *Deleted

## 2013-05-28 MED ORDER — LISINOPRIL 10 MG PO TABS: 10.0000 mg | ORAL_TABLET | Freq: Every day | ORAL | Status: DC

## 2013-05-29 ENCOUNTER — Encounter: Payer: Self-pay | Admitting: Cardiovascular Disease

## 2013-05-29 ENCOUNTER — Ambulatory Visit (INDEPENDENT_AMBULATORY_CARE_PROVIDER_SITE_OTHER): Payer: Medicare Other | Admitting: Cardiovascular Disease

## 2013-05-29 VITALS — BP 142/86 | HR 64 | Ht 66.0 in | Wt 156.0 lb

## 2013-05-29 DIAGNOSIS — I4891 Unspecified atrial fibrillation: Secondary | ICD-10-CM | POA: Diagnosis not present

## 2013-05-29 DIAGNOSIS — I10 Essential (primary) hypertension: Secondary | ICD-10-CM

## 2013-05-29 NOTE — Patient Instructions (Signed)
Your physician wants you to follow-up in:  6 MONTHS WITH DR NISHAN  You will receive a reminder letter in the mail two months in advance. If you don't receive a letter, please call our office to schedule the follow-up appointment. Your physician recommends that you continue on your current medications as directed. Please refer to the Current Medication list given to you today. 

## 2013-05-29 NOTE — Assessment & Plan Note (Signed)
Maint NSR on flecainide  Stable

## 2013-05-29 NOTE — Assessment & Plan Note (Signed)
Labile with some white coat component Stable Continue ACE

## 2013-05-29 NOTE — Progress Notes (Signed)
Patient ID: Amanda Olsen, female   DOB: 16-Oct-1934, 79 y.o.   MRN: [id number] Amanda Olsen is seen today for F/U of HTN and PAF. She has maintained NSR on Flecainide. She did not have proarrythmia on ETT. She has good LV function and no CAD by cath. Her last visit we started ACE for BP contorl. She sold her apartment there and is living independanlty in an apartment in [place]. She denies SSCP, palpitations, syncope, edema or dyspnea. I reviewed lab work from 04/08/10 and her K was 3.9 with Cr .7 and normal LFTS. LDL was 103. She has a component of white coat hypertension. Has son and daughter in law with two grandchildren in [place] but misses [place] where she is from. Does not drive Seen in ER 46/96 for palpitations but in NSR. Has white coat syndrome and home BP is in the 120 systolic range  Labs reviewed from [facility] and normla LDL was 99  ROS: Denies fever, malais, weight loss, blurry vision, decreased visual acuity, cough, sputum, SOB, hemoptysis, pleuritic pain, palpitaitons, heartburn, abdominal pain, melena, lower extremity edema, claudication, or rash.  All other systems reviewed and negative  General: Affect appropriate Healthy:  appears stated age HEENT: normal Neck supple with no adenopathy JVP normal no bruits no thyromegaly Lungs clear with no wheezing and good diaphragmatic motion Heart:  S1/S2 no murmur, no rub, gallop or click PMI normal Abdomen: benighn, BS positve, no tenderness, no AAA no bruit.  No HSM or HJR Distal pulses intact with no bruits No edema Neuro non-focal Skin warm and dry No muscular weakness   Current Outpatient Prescriptions  Medication Sig Dispense Refill  . aspirin EC 81 MG tablet Take 81 mg by mouth daily.      . calcium carbonate (OS-CAL) 600 MG TABS Take 600 mg by mouth daily.        . Cholecalciferol (VITAMIN D) 2000 UNITS tablet Take 2,000 Units by mouth daily.        . flecainide (TAMBOCOR) 100 MG tablet Take 1 tablet  (100 mg total) by mouth 2 (two) times daily.  60 tablet  2  . lisinopril (PRINIVIL,ZESTRIL) 10 MG tablet Take 1 tablet (10 mg total) by mouth daily.  30 tablet  11   No current facility-administered medications for this visit.    Allergies  Review of patient's allergies indicates no known allergies.  Electrocardiogram:  Assessment and Plan

## 2013-06-26 ENCOUNTER — Other Ambulatory Visit: Payer: Self-pay | Admitting: *Deleted

## 2013-06-26 MED ORDER — FLECAINIDE ACETATE 100 MG PO TABS: 100.0000 mg | ORAL_TABLET | Freq: Two times a day (BID) | ORAL | Status: DC

## 2013-06-30 ENCOUNTER — Other Ambulatory Visit: Payer: Self-pay | Admitting: *Deleted

## 2013-06-30 NOTE — Telephone Encounter (Signed)
Refill already sent per pharmacist.Nina,CMA

## 2013-07-03 ENCOUNTER — Other Ambulatory Visit: Payer: Self-pay | Admitting: *Deleted

## 2013-07-03 MED ORDER — LISINOPRIL 10 MG PO TABS: 10.0000 mg | ORAL_TABLET | Freq: Every day | ORAL | Status: DC

## 2013-08-15 ENCOUNTER — Encounter: Payer: Self-pay | Admitting: Nurse Practitioner

## 2013-08-15 DIAGNOSIS — R7301 Impaired fasting glucose: Secondary | ICD-10-CM | POA: Diagnosis not present

## 2013-08-15 DIAGNOSIS — M810 Age-related osteoporosis without current pathological fracture: Secondary | ICD-10-CM | POA: Diagnosis not present

## 2013-08-15 DIAGNOSIS — D539 Nutritional anemia, unspecified: Secondary | ICD-10-CM | POA: Diagnosis not present

## 2013-08-15 DIAGNOSIS — E039 Hypothyroidism, unspecified: Secondary | ICD-10-CM | POA: Diagnosis not present

## 2013-08-15 DIAGNOSIS — I10 Essential (primary) hypertension: Secondary | ICD-10-CM | POA: Diagnosis not present

## 2013-08-21 DIAGNOSIS — I4891 Unspecified atrial fibrillation: Secondary | ICD-10-CM | POA: Diagnosis not present

## 2013-08-21 DIAGNOSIS — E039 Hypothyroidism, unspecified: Secondary | ICD-10-CM | POA: Diagnosis not present

## 2013-08-21 DIAGNOSIS — E538 Deficiency of other specified B group vitamins: Secondary | ICD-10-CM | POA: Diagnosis not present

## 2013-08-21 DIAGNOSIS — M810 Age-related osteoporosis without current pathological fracture: Secondary | ICD-10-CM | POA: Diagnosis not present

## 2013-08-21 DIAGNOSIS — I10 Essential (primary) hypertension: Secondary | ICD-10-CM | POA: Diagnosis not present

## 2013-09-18 DIAGNOSIS — Z23 Encounter for immunization: Secondary | ICD-10-CM | POA: Diagnosis not present

## 2013-09-23 DIAGNOSIS — E538 Deficiency of other specified B group vitamins: Secondary | ICD-10-CM | POA: Diagnosis not present

## 2013-10-10 ENCOUNTER — Encounter: Payer: Self-pay | Admitting: Nurse Practitioner

## 2013-10-10 ENCOUNTER — Ambulatory Visit (INDEPENDENT_AMBULATORY_CARE_PROVIDER_SITE_OTHER): Payer: Medicare Other | Admitting: Nurse Practitioner

## 2013-10-10 VITALS — BP 180/90 | HR 58 | Ht 66.0 in | Wt 161.6 lb

## 2013-10-10 DIAGNOSIS — I10 Essential (primary) hypertension: Secondary | ICD-10-CM

## 2013-10-10 LAB — BASIC METABOLIC PANEL
BUN: 13 mg/dL (ref 6–23)
CO2: 28 mEq/L (ref 19–32)
Calcium: 9.3 mg/dL (ref 8.4–10.5)
Chloride: 104 mEq/L (ref 96–112)
Creatinine, Ser: 0.7 mg/dL (ref 0.4–1.2)
GFR: 82.09 mL/min (ref >60.00)
Glucose, Bld: 83 mg/dL (ref 70–99)
Potassium: 4.1 mEq/L (ref 3.5–5.1)
Sodium: 139 mEq/L (ref 135–145)

## 2013-10-10 MED ORDER — HYDROCHLOROTHIAZIDE 25 MG PO TABS: 25.0000 mg | ORAL_TABLET | Freq: Every day | ORAL | Status: DC

## 2013-10-10 NOTE — Patient Instructions (Addendum)
Cut back on your salt use  Stay on your same medicines but I am adding HCTZ 25 mg to take each day - this is at your drug store - start today  We will check lab today  I will see you in 2 weeks - we will check lab on return (do not need to fast)  Call the [facility] office at [phone] if you have any questions, problems or concerns.

## 2013-10-10 NOTE — Progress Notes (Signed)
Amanda Olsen Date of Birth: 23-Apr-1934 Medical Record #[id number]  History of Present Illness: Amanda Olsen is seen back today for a work in visit. Seen for Dr. Eden Emms. Originally from [place]. [place], [place]. She has a history of HTN and PAF - maintained in NSR on Flecainide. Good LV function and no CAD per past cath.   Last seen here in June. Seemed to be doing ok.   Comes back today. Called to schedule a visit due to increased BP. Had really not been checking her BP until this past Sunday - quite elevated at home. She does not feel stressed and says "she lives a quiet life". No chest pain. Not short of breath. Rhythm has been ok. She does like to use salt. Had her labs checked in September with her PCP - they were reviewed.    Current Outpatient Prescriptions  Medication Sig Dispense Refill  . aspirin EC 81 MG tablet Take 81 mg by mouth daily.      . calcium carbonate (OS-Olsen) 600 MG TABS Take 600 mg by mouth daily.        . Cholecalciferol (VITAMIN D) 2000 UNITS tablet Take 2,000 Units by mouth daily.        . Cyanocobalamin (VITAMIN B-12 IJ) Inject 50,000 Units as directed every 30 (thirty) days.      . flecainide (TAMBOCOR) 100 MG tablet Take 1 tablet (100 mg total) by mouth 2 (two) times daily.  60 tablet  3  . lisinopril (PRINIVIL,ZESTRIL) 10 MG tablet Take 1 tablet (10 mg total) by mouth daily.  30 tablet  11   No current facility-administered medications for this visit.    No Known Allergies  Past Medical History  Diagnosis Date  . Atrial fibrillation     maintained in sinus on Flecainide  . Cataract   . HTN (hypertension)   . Rosacea   . ELECTROCARDIOGRAM, ABNORMAL     Past Surgical History  Procedure Laterality Date  . Varicose vein surgery      History  Smoking status  . Never Smoker   Smokeless tobacco  . Not on file    History  Alcohol Use No    History reviewed. No pertinent family history.  Review of Systems: The review of systems is per the  HPI.  All other systems were reviewed and are negative.  Physical Exam: BP 180/90  Pulse 58  Ht 5\' 6"  (1.676 m)  Wt 161 lb 9.6 oz (73.301 kg)  BMI 26.1 kg/m2  SpO2 98% BP by me is 170/88. Patient is very pleasant and in no acute distress. Skin is warm and dry. Color is normal.  HEENT is unremarkable. Normocephalic/atraumatic. PERRL. Sclera are nonicteric. Neck is supple. No masses. No JVD. Lungs are clear. Cardiac exam shows a regular rate and rhythm. Abdomen is soft. Extremities are without edema. Gait and ROM are intact. No gross neurologic deficits noted.  LABORATORY DATA: BMET pending     Chemistry      Component Value Date/Time   NA 131* 09/14/2012 1642   K 4.0 09/14/2012 1642   CL 98 09/14/2012 1642   CO2 21 09/14/2012 1642   BUN 26* 09/14/2012 1642   CREATININE 0.69 09/14/2012 1642      Component Value Date/Time   CALCIUM 9.5 09/14/2012 1642   ALKPHOS 60 09/14/2012 1642   AST 25 09/14/2012 1642   ALT 20 09/14/2012 1642   BILITOT 0.7 09/14/2012 1642     Lab Results  Component Value Date  WBC 8.1 09/14/2012   HGB 15.0 09/14/2012   HCT 43.6 09/14/2012   MCV 99.5 09/14/2012   PLT 137* 09/14/2012      Echocardiogram and cardiac catheterization dated July 01, 2008.  Overall left ventricular systolic  function was at lower limits of normal. Left ventricular ejection  fraction was 50-55%. Left ventricular wall thickness was upper limits  of normal. Aortic valve mildly calcified. Mild mitral regurgitation.  Left atrium was mildly dilated. Right atrium was mildly dilated.  The patient had a cardiac catheterization completed per Dr. Excell Seltzer on July 01, 2008. Per  Dr. Excell Seltzer, the patient had minor nonobstructive LAD stenosis, normal  left circumflex and right coronary artery with hyperdynamic LV function  with an EF of 75%.    Assessment / Plan: 1. HTN - will start HCTZ 25 mg a day - check BMET today. She will continue to monitor. I will see her back in 2 weeks with repeat BMET on  return.  2. PAF - on Flecainide - in sinus by exam today.   Patient is agreeable to this plan and will call if any problems develop in the interim.   Rosalio Macadamia, RN, ANP-C [facility] [address] [place], [place]  [address]

## 2013-10-13 ENCOUNTER — Telehealth: Payer: Self-pay | Admitting: Cardiovascular Disease

## 2013-10-13 NOTE — Telephone Encounter (Signed)
New Problem  Pt recently had a med change and her BP has decreased to 85/55 and the pt is concerned. Please call grand daughter at the number provided to discuss... Grandmother does not speak english

## 2013-10-13 NOTE — Telephone Encounter (Signed)
Called patient's granddaughter back. She advised that the patient has taken 3 doses of HCTZ 25mg  over the past 3 days and today her BP is 85/55 and she feels weak and tired. Advised will discuss with Norma Fredrickson since she just saw her on 10/31 and call her back.

## 2013-10-13 NOTE — Telephone Encounter (Signed)
Spoke with Norma Fredrickson and she advised to have the patient hold the HCTZ on 11/4 and 11/5. Check BP and HR on 11/4 and 11/5 and call us on 11/5 with vital sign readings so we can make a determination of what to do with HCTZ. Dwana Curd stated that when the BP was rechecked recently it was 100/63 with a heart rate of 72. Vera understood and agreed with the above plan.

## 2013-10-15 ENCOUNTER — Telehealth: Payer: Self-pay | Admitting: Nurse Practitioner

## 2013-10-15 NOTE — Telephone Encounter (Signed)
S/w grandaughter who lives in [facility] @ [phone] and wants to be the primary contact for pt. Explained all instructions and granddaughter will relate message to pt agreeable to plan. Also discussed pt's appt in two week if possible to bring friend with pt to help understand, granddaughter stated would make that happen thank me for all the help

## 2013-10-15 NOTE — Telephone Encounter (Signed)
This is urs

## 2013-10-15 NOTE — Telephone Encounter (Signed)
New message  Patient was advised to call in with BP readings:  Morning: 10/14/13: 120/80 pulse 64 Evening: 10/14/13: 106/67 pulse 70  Morning: 10/15/13: 130/80  Pulse 65  Please Advise and call grandaughter

## 2013-10-15 NOTE — Telephone Encounter (Signed)
Please call to her granddaughter  Would have her continue to hold the HCTZ  Limit salt  Monitor BP at home

## 2013-10-21 ENCOUNTER — Other Ambulatory Visit: Payer: Self-pay | Admitting: Cardiovascular Disease

## 2013-10-24 ENCOUNTER — Encounter: Payer: Self-pay | Admitting: Nurse Practitioner

## 2013-10-24 ENCOUNTER — Ambulatory Visit (INDEPENDENT_AMBULATORY_CARE_PROVIDER_SITE_OTHER): Payer: Medicare Other | Admitting: Nurse Practitioner

## 2013-10-24 VITALS — BP 110/70 | HR 58 | Ht 66.0 in | Wt 157.1 lb

## 2013-10-24 DIAGNOSIS — I10 Essential (primary) hypertension: Secondary | ICD-10-CM | POA: Diagnosis not present

## 2013-10-24 NOTE — Patient Instructions (Addendum)
Follow this list of medicines  See Dr. Eden Emms back as planned in December  Restrict your use of salt.  Call the [facility] office at [phone] if you have any questions, problems or concerns.

## 2013-10-24 NOTE — Progress Notes (Signed)
Amanda Olsen Date of Birth: Dec 28, 1933 Medical Record #[id number]  History of Present Illness: Ms. Kouns is seen back today for a 2 week check. Seen for Dr. Eden Emms. She is originally from [place]. [place], [place]. Has a history of HTN and PAF - maintained in sinus on Flecainide. Good LV function and no CAD per past cath.   Seen 2 weeks ago after noting an increase in her BP. Using too much salt. I put her on HCTZ. BP them dropped and she felt bad. Communication has been difficult and we have been trying to stay in touch with her grand daughter in [place] [phone]). We have subsequently stopped the HCTZ. Continued to advise salt restriction.   Comes back today. Here alone. She says she is doing ok. Brings in her list of medicines. She is not taking the HCTZ. She is trying to do better about restricting her salt. Feels ok. No chest pain. Not short of breath. Seems lonely.   Current Outpatient Prescriptions  Medication Sig Dispense Refill  . aspirin EC 81 MG tablet Take 81 mg by mouth daily.      . calcium carbonate (OS-Olsen) 600 MG TABS Take 600 mg by mouth daily.        . Cholecalciferol (VITAMIN D) 2000 UNITS tablet Take 2,000 Units by mouth daily.        . Cyanocobalamin (VITAMIN B-12 IJ) Inject 1,000 mcg as directed every 30 (thirty) days.       . flecainide (TAMBOCOR) 100 MG tablet TAKE 1 TABLET (100 MG TOTAL) BY MOUTH 2 (TWO) TIMES DAILY.  60 tablet  1  . lisinopril (PRINIVIL,ZESTRIL) 10 MG tablet Take 1 tablet (10 mg total) by mouth daily.  30 tablet  11  . hydrochlorothiazide (HYDRODIURIL) 25 MG tablet Take 1 tablet (25 mg total) by mouth daily.  30 tablet  6   No current facility-administered medications for this visit.    No Known Allergies  Past Medical History  Diagnosis Date  . Atrial fibrillation     maintained in sinus on Flecainide  . Cataract   . HTN (hypertension)   . Rosacea   . ELECTROCARDIOGRAM, ABNORMAL     Past Surgical History  Procedure Laterality  Date  . Varicose vein surgery      History  Smoking status  . Never Smoker   Smokeless tobacco  . Not on file    History  Alcohol Use No    History reviewed. No pertinent family history.  Review of Systems: The review of systems is per the HPI.  All other systems were reviewed and are negative.  Physical Exam: BP 110/70  Pulse 58  Ht 5\' 6"  (1.676 m)  Wt 157 lb 1.9 oz (71.269 kg)  BMI 25.37 kg/m2  SpO2 96% BP is 120/80.  Patient is very pleasant and in no acute distress. Skin is warm and dry. Color is normal.  HEENT is unremarkable. Normocephalic/atraumatic. PERRL. Sclera are nonicteric. Neck is supple. No masses. No JVD. Lungs are clear. Cardiac exam shows a regular rate and rhythm. Abdomen is soft. Extremities are without edema. Gait and ROM are intact. No gross neurologic deficits noted.  LABORATORY DATA:    Chemistry      Component Value Date/Time   NA 139 10/10/2013 0949   K 4.1 10/10/2013 0949   CL 104 10/10/2013 0949   CO2 28 10/10/2013 0949   BUN 13 10/10/2013 0949   CREATININE 0.7 10/10/2013 0949      Component Value Date/Time  CALCIUM 9.3 10/10/2013 0949   ALKPHOS 60 09/14/2012 1642   AST 25 09/14/2012 1642   ALT 20 09/14/2012 1642   BILITOT 0.7 09/14/2012 1642       Assessment / Plan:  1. HTN - BP looks fine. Would hold on the HCTZ for now. Salt restriction is crucial. She will continue to monitor her readings at home. See Dr. Eden Emms back as planned next month for her routine visit.   2. PAF - maintained in sinus on flecainide/aspirin  Patient is agreeable to this plan and will call if any problems develop in the interim.   Rosalio Macadamia, RN, ANP-C [facility] [address] [place], [place]  [address]

## 2013-10-27 DIAGNOSIS — E538 Deficiency of other specified B group vitamins: Secondary | ICD-10-CM | POA: Diagnosis not present

## 2013-11-19 ENCOUNTER — Other Ambulatory Visit: Payer: Self-pay | Admitting: Cardiovascular Disease

## 2013-11-28 DIAGNOSIS — E538 Deficiency of other specified B group vitamins: Secondary | ICD-10-CM | POA: Diagnosis not present

## 2013-12-08 ENCOUNTER — Encounter: Payer: Self-pay | Admitting: Cardiovascular Disease

## 2013-12-08 ENCOUNTER — Ambulatory Visit (INDEPENDENT_AMBULATORY_CARE_PROVIDER_SITE_OTHER): Payer: Medicare Other | Admitting: Cardiovascular Disease

## 2013-12-08 VITALS — BP 120/70 | HR 60 | Wt 161.0 lb

## 2013-12-08 DIAGNOSIS — I10 Essential (primary) hypertension: Secondary | ICD-10-CM | POA: Diagnosis not present

## 2013-12-08 DIAGNOSIS — I4891 Unspecified atrial fibrillation: Secondary | ICD-10-CM

## 2013-12-08 DIAGNOSIS — R9431 Abnormal electrocardiogram [ECG] [EKG]: Secondary | ICD-10-CM

## 2013-12-08 NOTE — Assessment & Plan Note (Signed)
Stable no HCTZ needed Home readings are excellent on just ACE

## 2013-12-08 NOTE — Assessment & Plan Note (Signed)
Maint NSR on flecainide QT 446  Stable

## 2013-12-08 NOTE — Assessment & Plan Note (Signed)
LBBB with PR 216 stable since 2 years ago No high grade AV block Evidence of SSB though  Yearly ECG

## 2013-12-08 NOTE — Progress Notes (Signed)
Patient ID: Amanda Olsen, female   DOB: [birth year], 79 y.o.   MRN: [id number] Amanda Olsen is seen today for F/U of HTN and PAF. She has maintained NSR on Flecainide. She did not have proarrythmia on ETT. She has good LV function and no CAD by cath. Her last visit we started ACE for BP contorl. She sold her apartment there and is living independanlty in an apartment in [place]. She denies SSCP, palpitations, syncope, edema or dyspnea. I reviewed lab work from 04/08/10 and her K was 3.9 with Cr .7 and normal LFTS. LDL was 103. She has a component of white coat hypertension. Has son and daughter in law with two grandchildren in [place] but misses [place] where she is from. Does not drive Seen in ER 78/29 for palpitations but in NSR. Has white coat syndrome and home BP is in the 120 systolic range  Labs reviewed from [facility] and normla LDL was 99  Seen by Norma Fredrickson in November and BP high started on HCTZ but this dropped her BP too low Home readings have been fine on just ACE last 5 weeks       ROS: Denies fever, malais, weight loss, blurry vision, decreased visual acuity, cough, sputum, SOB, hemoptysis, pleuritic pain, palpitaitons, heartburn, abdominal pain, melena, lower extremity edema, claudication, or rash.  All other systems reviewed and negative  General: Affect appropriate Healthy:  appears stated age HEENT: normal Neck supple with no adenopathy JVP normal no bruits no thyromegaly Lungs clear with no wheezing and good diaphragmatic motion Heart:  S1/S2 no murmur, no rub, gallop or click PMI normal Abdomen: benighn, BS positve, no tenderness, no AAA no bruit.  No HSM or HJR Distal pulses intact with no bruits No edema Neuro non-focal Skin warm and dry No muscular weakness   Current Outpatient Prescriptions  Medication Sig Dispense Refill  . aspirin EC 81 MG tablet Take 81 mg by mouth daily.      . calcium carbonate (OS-CAL) 600 MG TABS Take 600 mg by  mouth daily.        . Cholecalciferol (VITAMIN D) 2000 UNITS tablet Take 2,000 Units by mouth daily.        . Cyanocobalamin (VITAMIN B-12 IJ) Inject 1,000 mcg as directed every 30 (thirty) days.       . flecainide (TAMBOCOR) 100 MG tablet TAKE 1 TABLET (100 MG TOTAL) BY MOUTH 2 (TWO) TIMES DAILY.  60 tablet  0  . lisinopril (PRINIVIL,ZESTRIL) 10 MG tablet Take 1 tablet (10 mg total) by mouth daily.  30 tablet  11   No current facility-administered medications for this visit.    Allergies  Review of patient's allergies indicates no known allergies.  Electrocardiogram:  SR rate 60 PR 216 LBBB   Assessment and Plan

## 2013-12-08 NOTE — Patient Instructions (Signed)
Your physician wants you to follow-up in:  6 MONTHS WITH DR NISHAN  You will receive a reminder letter in the mail two months in advance. If you don't receive a letter, please call our office to schedule the follow-up appointment. Your physician recommends that you continue on your current medications as directed. Please refer to the Current Medication list given to you today. 

## 2013-12-20 ENCOUNTER — Other Ambulatory Visit: Payer: Self-pay | Admitting: Cardiovascular Disease

## 2014-01-01 DIAGNOSIS — E538 Deficiency of other specified B group vitamins: Secondary | ICD-10-CM | POA: Diagnosis not present

## 2014-01-19 ENCOUNTER — Other Ambulatory Visit: Payer: Self-pay | Admitting: Cardiovascular Disease

## 2014-02-02 DIAGNOSIS — E538 Deficiency of other specified B group vitamins: Secondary | ICD-10-CM | POA: Diagnosis not present

## 2014-02-20 ENCOUNTER — Encounter (INDEPENDENT_AMBULATORY_CARE_PROVIDER_SITE_OTHER): Payer: Self-pay

## 2014-02-20 DIAGNOSIS — I4891 Unspecified atrial fibrillation: Secondary | ICD-10-CM | POA: Diagnosis not present

## 2014-02-20 DIAGNOSIS — E538 Deficiency of other specified B group vitamins: Secondary | ICD-10-CM | POA: Diagnosis not present

## 2014-02-20 DIAGNOSIS — M810 Age-related osteoporosis without current pathological fracture: Secondary | ICD-10-CM | POA: Diagnosis not present

## 2014-02-20 DIAGNOSIS — Z1331 Encounter for screening for depression: Secondary | ICD-10-CM | POA: Diagnosis not present

## 2014-02-20 DIAGNOSIS — E039 Hypothyroidism, unspecified: Secondary | ICD-10-CM | POA: Diagnosis not present

## 2014-02-20 DIAGNOSIS — I10 Essential (primary) hypertension: Secondary | ICD-10-CM | POA: Diagnosis not present

## 2014-02-20 DIAGNOSIS — Z0000 Encounter for general adult medical examination without abnormal findings: Secondary | ICD-10-CM | POA: Diagnosis not present

## 2014-02-20 DIAGNOSIS — E663 Overweight: Secondary | ICD-10-CM | POA: Diagnosis not present

## 2014-02-26 DIAGNOSIS — I4891 Unspecified atrial fibrillation: Secondary | ICD-10-CM | POA: Diagnosis not present

## 2014-02-26 DIAGNOSIS — M810 Age-related osteoporosis without current pathological fracture: Secondary | ICD-10-CM | POA: Diagnosis not present

## 2014-02-26 DIAGNOSIS — I10 Essential (primary) hypertension: Secondary | ICD-10-CM | POA: Diagnosis not present

## 2014-02-26 DIAGNOSIS — E039 Hypothyroidism, unspecified: Secondary | ICD-10-CM | POA: Diagnosis not present

## 2014-04-18 ENCOUNTER — Other Ambulatory Visit: Payer: Self-pay | Admitting: Cardiovascular Disease

## 2014-05-19 ENCOUNTER — Other Ambulatory Visit: Payer: Self-pay | Admitting: Cardiovascular Disease

## 2014-05-26 ENCOUNTER — Encounter: Payer: Self-pay | Admitting: Cardiovascular Disease

## 2014-05-26 ENCOUNTER — Ambulatory Visit (INDEPENDENT_AMBULATORY_CARE_PROVIDER_SITE_OTHER): Payer: Medicare Other | Admitting: Cardiovascular Disease

## 2014-05-26 VITALS — BP 144/73 | HR 55 | Ht 66.0 in | Wt 157.0 lb

## 2014-05-26 DIAGNOSIS — I10 Essential (primary) hypertension: Secondary | ICD-10-CM | POA: Diagnosis not present

## 2014-05-26 DIAGNOSIS — I4891 Unspecified atrial fibrillation: Secondary | ICD-10-CM

## 2014-05-26 DIAGNOSIS — R9431 Abnormal electrocardiogram [ECG] [EKG]: Secondary | ICD-10-CM

## 2014-05-26 MED ORDER — FLECAINIDE ACETATE 100 MG PO TABS: 100.0000 mg | ORAL_TABLET | Freq: Two times a day (BID) | ORAL | Status: DC

## 2014-05-26 NOTE — Assessment & Plan Note (Signed)
Maint NSR  Continue flecainide

## 2014-05-26 NOTE — Patient Instructions (Signed)
Your physician wants you to follow-up in:   12MONTHS WITH  DR NISHAN You will receive a reminder letter in the mail two months in advance. If you don't receive a letter, please call our office to schedule the follow-up appointment. Your physician recommends that you continue on your current medications as directed. Please refer to the Current Medication list given to you today.  

## 2014-05-26 NOTE — Assessment & Plan Note (Signed)
My manual recording 130/70 and all of her home readings quite low  Continue current meds

## 2014-05-26 NOTE — Assessment & Plan Note (Signed)
QT ok  No PAC;s on exam today f/u echo in a year for first degree block

## 2014-05-26 NOTE — Progress Notes (Signed)
Patient ID: Amanda Olsen, female   DOB: 1933-12-31, 80 y.o.   MRN: [id number] Amanda Olsen is seen today for F/U of HTN and PAF. She has maintained NSR on Flecainide. She did not have proarrythmia on ETT. She has good LV function and no CAD by cath. Her last visit we started ACE for BP contorl. She sold her apartment there and is living independanlty in an apartment in [place]. She denies SSCP, palpitations, syncope, edema or dyspnea. I reviewed lab work from 04/08/10 and her K was 3.9 with Cr .7 and normal LFTS. LDL was 103. She has a component of white coat hypertension. Has son and daughter in law with two grandchildren in [place] but misses [place] where she is from. Does not drive Seen in ER 99/37 for palpitations but in NSR. Has white coat syndrome and home BP is in the 120 systolic range  Labs reviewed from [facility] and normla LDL was 99  Seen by Norma Fredrickson in November and BP high started on HCTZ but this dropped her BP too low Home readings have been fine on just ACE now   No complaints son is in [place] for work next 2 years  Needs refill on flecainide   ROS: Denies fever, malais, weight loss, blurry vision, decreased visual acuity, cough, sputum, SOB, hemoptysis, pleuritic pain, palpitaitons, heartburn, abdominal pain, melena, lower extremity edema, claudication, or rash.  All other systems reviewed and negative  General: Affect appropriate Healthy:  appears stated age HEENT: normal  [place] Neck supple with no adenopathy JVP normal no bruits no thyromegaly Lungs clear with no wheezing and good diaphragmatic motion Heart:  S1/S2 no murmur, no rub, gallop or click PMI normal Abdomen: benighn, BS positve, no tenderness, no AAA no bruit.  No HSM or HJR Distal pulses intact with no bruits No edema Neuro non-focal Skin warm and dry No muscular weakness   Current Outpatient Prescriptions  Medication Sig Dispense Refill  . aspirin EC 81 MG tablet Take 81 mg  by mouth daily.      . calcium carbonate (OS-CAL) 600 MG TABS Take 600 mg by mouth daily.        . Cholecalciferol (VITAMIN D) 2000 UNITS tablet Take 2,000 Units by mouth daily.       . flecainide (TAMBOCOR) 100 MG tablet TAKE 1 TABLET (100 MG TOTAL) BY MOUTH 2 (TWO) TIMES DAILY.  60 tablet  0  . lisinopril (PRINIVIL,ZESTRIL) 10 MG tablet Take 1 tablet (10 mg total) by mouth daily.  30 tablet  11   No current facility-administered medications for this visit.    Allergies  Review of patient's allergies indicates no known allergies.  Electrocardiogram: 12/29  SR rate 60 PR 216 PAC  Assessment and Plan

## 2014-06-14 ENCOUNTER — Other Ambulatory Visit: Payer: Self-pay | Admitting: Cardiovascular Disease

## 2014-06-16 ENCOUNTER — Other Ambulatory Visit: Payer: Self-pay | Admitting: Interventional Cardiology

## 2014-08-21 DIAGNOSIS — E039 Hypothyroidism, unspecified: Secondary | ICD-10-CM | POA: Diagnosis not present

## 2014-08-21 DIAGNOSIS — I4891 Unspecified atrial fibrillation: Secondary | ICD-10-CM | POA: Diagnosis not present

## 2014-08-21 DIAGNOSIS — I10 Essential (primary) hypertension: Secondary | ICD-10-CM | POA: Diagnosis not present

## 2014-08-21 DIAGNOSIS — M810 Age-related osteoporosis without current pathological fracture: Secondary | ICD-10-CM | POA: Diagnosis not present

## 2014-08-21 DIAGNOSIS — E538 Deficiency of other specified B group vitamins: Secondary | ICD-10-CM | POA: Diagnosis not present

## 2014-08-28 DIAGNOSIS — I10 Essential (primary) hypertension: Secondary | ICD-10-CM | POA: Diagnosis not present

## 2014-08-28 DIAGNOSIS — I4891 Unspecified atrial fibrillation: Secondary | ICD-10-CM | POA: Diagnosis not present

## 2014-08-28 DIAGNOSIS — M810 Age-related osteoporosis without current pathological fracture: Secondary | ICD-10-CM | POA: Diagnosis not present

## 2014-08-28 DIAGNOSIS — E039 Hypothyroidism, unspecified: Secondary | ICD-10-CM | POA: Diagnosis not present

## 2014-08-29 DIAGNOSIS — Z23 Encounter for immunization: Secondary | ICD-10-CM | POA: Diagnosis not present

## 2014-10-06 IMAGING — DX DG CHEST 1V PORT
1 series · 1 of 1 positions shown · non-contrast
Comparison: 09/11/2017

CLINICAL DATA: Chest pain and shortness of breath

EXAM:
PORTABLE CHEST 1 VIEW

[chest]
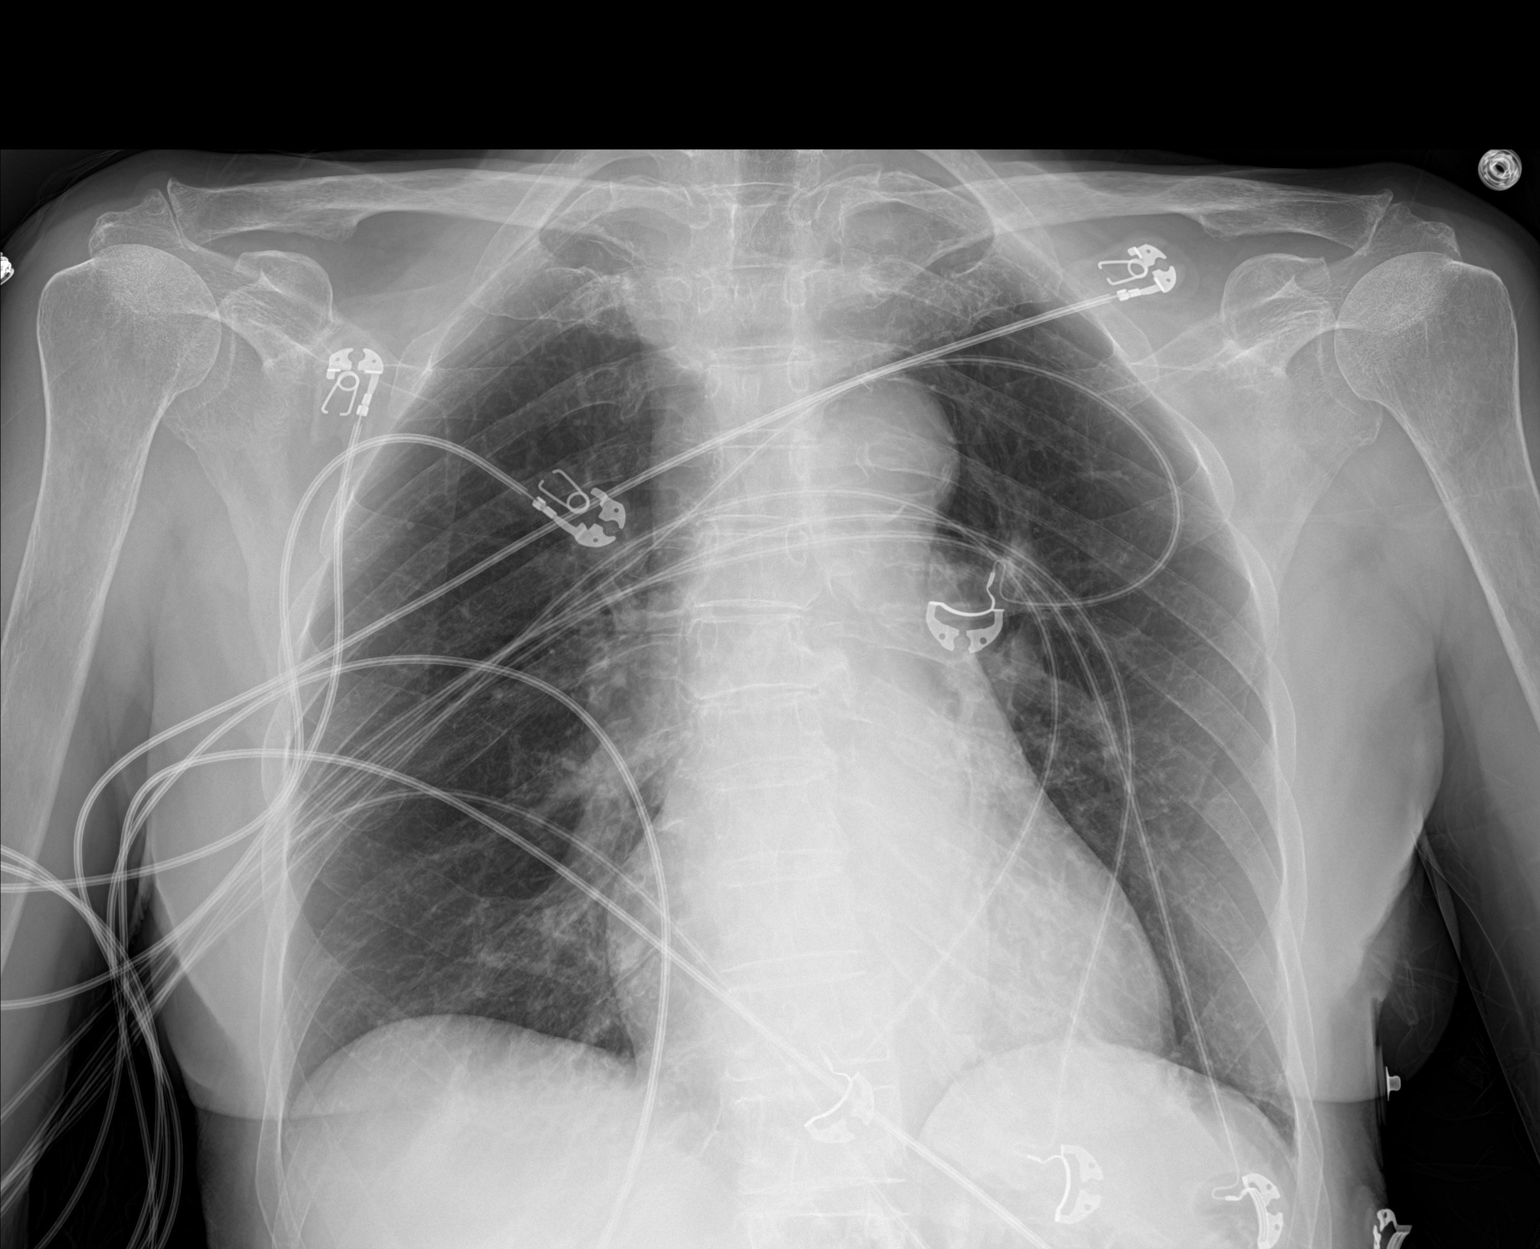

[1 of 1 positions shown; findings below may reference images not displayed]

FINDINGS: Normal heart size and mediastinal contours. No acute infiltrate or
edema. No effusion or pneumothorax. No acute osseous findings.
IMPRESSION: No active disease.

## 2014-12-22 NOTE — Progress Notes (Signed)
Patient ID: Amanda Olsen, female   DOB: 02-23-1934, 81 y.o.   MRN: [id number] Amanda Olsen is seen today for F/U of HTN and PAF. She has maintained NSR on Flecainide. She did not have proarrythmia on ETT. She has good LV function and no CAD by cath. Her last visit we started ACE for BP contorl. She sold her apartment there and is living independanlty in an apartment in [place]. She denies SSCP, palpitations, syncope, edema or dyspnea. I reviewed lab work from 04/08/10 and her K was 3.9 with Cr .7 and normal LFTS. LDL was 103. She has a component of white coat hypertension. Has son and daughter in law with two grandchildren in [place] but [place] where she is from. Does not drive Seen in ER 94/80 for palpitations but in NSR. Has white coat syndrome and home BP is in the 120 systolic range  Labs reviewed from [facility] and normla LDL was 99  Seen by Norma Fredrickson in November and BP high started on HCTZ but this dropped her BP too low Home readings have been fine on just ACE now     Needs refill on flecainide   ROS: Denies fever, malais, weight loss, blurry vision, decreased visual acuity, cough, sputum, SOB, hemoptysis, pleuritic pain, palpitaitons, heartburn, abdominal pain, melena, lower extremity edema, claudication, or rash.  All other systems reviewed and negative  General: Affect appropriate Healthy:  appears stated age HEENT: normal  [place]case[place] Neck supple with no adenopathy JVP normal no bruits no thyromegaly Lungs clear with no wheezing and good diaphragmatic motion Heart:  S1/S2 no murmur, no rub, gallop or click PMI normal Abdomen: benighn, BS positve, no tenderness, no AAA no bruit.  No HSM or HJR Distal pulses intact with no bruits No edema Neuro non-focal Skin warm and dry No muscular weakness   Current Outpatient Prescriptions  Medication Sig Dispense Refill  . aspirin EC 81 MG tablet Take 81 mg by mouth daily.    . calcium carbonate (OS-CAL) 600 MG  TABS Take 600 mg by mouth daily.      . Cholecalciferol (VITAMIN D) 2000 UNITS tablet Take 2,000 Units by mouth daily.     . flecainide (TAMBOCOR) 100 MG tablet Take 1 tablet (100 mg total) by mouth 2 (two) times daily. 60 tablet 11  . flecainide (TAMBOCOR) 100 MG tablet TAKE 1 TABLET (100 MG TOTAL) BY MOUTH 2 (TWO) TIMES DAILY. 60 tablet 10  . lisinopril (PRINIVIL,ZESTRIL) 10 MG tablet TAKE ONE TABLET BY MOUTH DAILY 30 tablet 10   No current facility-administered medications for this visit.    Allergies  Review of patient's allergies indicates no known allergies.  Electrocardiogram: 12/29  SR rate 60 PR 216 PAC today 12/24/14  SR rate 64 PR 210 LBBB no sig change   Assessment and Plan

## 2014-12-24 ENCOUNTER — Encounter: Payer: Self-pay | Admitting: Cardiovascular Disease

## 2014-12-24 ENCOUNTER — Ambulatory Visit (INDEPENDENT_AMBULATORY_CARE_PROVIDER_SITE_OTHER): Payer: Medicare Other | Admitting: Cardiovascular Disease

## 2014-12-24 VITALS — BP 140/80 | HR 63 | Ht 66.0 in | Wt 153.0 lb

## 2014-12-24 DIAGNOSIS — I10 Essential (primary) hypertension: Secondary | ICD-10-CM | POA: Diagnosis not present

## 2014-12-24 DIAGNOSIS — R9431 Abnormal electrocardiogram [ECG] [EKG]: Secondary | ICD-10-CM | POA: Diagnosis not present

## 2014-12-24 DIAGNOSIS — I4891 Unspecified atrial fibrillation: Secondary | ICD-10-CM

## 2014-12-24 NOTE — Patient Instructions (Signed)
Your physician recommends that you continue on your current medications as directed. Please refer to the Current Medication list given to you today.  Your physician wants you to follow-up in: 6 month ov You will receive a reminder letter in the mail two months in advance. If you don't receive a letter, please call our office to schedule the follow-up appointment.  

## 2014-12-24 NOTE — Assessment & Plan Note (Signed)
Maint NSR on flecainide no palpitations stable

## 2014-12-24 NOTE — Assessment & Plan Note (Signed)
Well controlled.  Continue current medications and low sodium Dash type diet.    

## 2014-12-24 NOTE — Assessment & Plan Note (Signed)
Chronic LBBB and first degree stable no high grade heart block  Yearly ECG

## 2015-02-24 ENCOUNTER — Other Ambulatory Visit: Payer: Self-pay

## 2015-02-24 MED ORDER — LISINOPRIL 10 MG PO TABS: 10.0000 mg | ORAL_TABLET | Freq: Every day | ORAL | Status: DC

## 2015-03-04 DIAGNOSIS — Z1389 Encounter for screening for other disorder: Secondary | ICD-10-CM | POA: Diagnosis not present

## 2015-03-04 DIAGNOSIS — Z23 Encounter for immunization: Secondary | ICD-10-CM | POA: Diagnosis not present

## 2015-03-04 DIAGNOSIS — E538 Deficiency of other specified B group vitamins: Secondary | ICD-10-CM | POA: Diagnosis not present

## 2015-03-04 DIAGNOSIS — Z0000 Encounter for general adult medical examination without abnormal findings: Secondary | ICD-10-CM | POA: Diagnosis not present

## 2015-03-04 DIAGNOSIS — I10 Essential (primary) hypertension: Secondary | ICD-10-CM | POA: Diagnosis not present

## 2015-03-04 DIAGNOSIS — E039 Hypothyroidism, unspecified: Secondary | ICD-10-CM | POA: Diagnosis not present

## 2015-03-04 DIAGNOSIS — M810 Age-related osteoporosis without current pathological fracture: Secondary | ICD-10-CM | POA: Diagnosis not present

## 2015-03-11 DIAGNOSIS — I480 Paroxysmal atrial fibrillation: Secondary | ICD-10-CM | POA: Diagnosis not present

## 2015-03-11 DIAGNOSIS — E039 Hypothyroidism, unspecified: Secondary | ICD-10-CM | POA: Diagnosis not present

## 2015-03-11 DIAGNOSIS — I10 Essential (primary) hypertension: Secondary | ICD-10-CM | POA: Diagnosis not present

## 2015-03-11 DIAGNOSIS — M810 Age-related osteoporosis without current pathological fracture: Secondary | ICD-10-CM | POA: Diagnosis not present

## 2015-03-22 DIAGNOSIS — L718 Other rosacea: Secondary | ICD-10-CM | POA: Diagnosis not present

## 2015-03-22 DIAGNOSIS — L821 Other seborrheic keratosis: Secondary | ICD-10-CM | POA: Diagnosis not present

## 2015-05-11 ENCOUNTER — Other Ambulatory Visit: Payer: Self-pay | Admitting: Cardiovascular Disease

## 2015-06-15 ENCOUNTER — Other Ambulatory Visit: Payer: Self-pay | Admitting: Cardiovascular Disease

## 2015-06-25 ENCOUNTER — Encounter: Payer: Self-pay | Admitting: *Deleted

## 2015-06-28 NOTE — Progress Notes (Signed)
Patient ID: Amanda Olsen, female   DOB: [birth year], 81 y.o.   MRN: [id number] Amanda Olsen is seen today for F/U of HTN and PAF. She has maintained NSR on Flecainide. She did not have proarrythmia on ETT. She has good LV function and no CAD by cath. Her last visit we started ACE for BP contorl. She sold her apartment there and is living independanlty in an apartment in [place]. She denies SSCP, palpitations, syncope, edema or dyspnea. I reviewed lab work from 04/08/10 and her K was 3.9 with Cr .7 and normal LFTS. LDL was 103. She has a component of white coat hypertension. Has son and daughter in law with two grandchildren in [place] but misses [place] where she is from. Does not drive Seen in ER 40/08 for palpitations but in NSR. Has white coat syndrome and home BP is in the 120 systolic range  Labs reviewed from [facility] and normla LDL was 99   Seen by Amanda Olsen in November and BP high started on HCTZ but this dropped her BP too low Home readings have been fine on just ACE now     Needs refill on flecainide   ROS: Denies fever, malais, weight loss, blurry vision, decreased visual acuity, cough, sputum, SOB, hemoptysis, pleuritic pain, palpitaitons, heartburn, abdominal pain, melena, lower extremity edema, claudication, or rash.  All other systems reviewed and negative  General: Affect appropriate Healthy:  appears stated age HEENT: normal  [place]case[place] Neck supple with no adenopathy JVP normal no bruits no thyromegaly Lungs clear with no wheezing and good diaphragmatic motion Heart:  S1/S2 no murmur, no rub, gallop or click PMI normal Abdomen: benighn, BS positve, no tenderness, no AAA no bruit.  No HSM or HJR Distal pulses intact with no bruits No edema Neuro non-focal Skin warm and dry No muscular weakness   Current Outpatient Prescriptions  Medication Sig Dispense Refill  . aspirin EC 81 MG tablet Take 81 mg by mouth daily.    . calcium carbonate (OS-CAL) 600  MG TABS Take 600 mg by mouth daily.      . Cholecalciferol (VITAMIN D) 2000 UNITS tablet Take 2,000 Units by mouth daily.     . cyanocobalamin 2000 MCG tablet Take 2,000 mcg by mouth daily. TAKE 2000mg  + 50mg  by mouth daily of vitamin b12    . flecainide (TAMBOCOR) 100 MG tablet TAKE 1 TABLET (100 MG TOTAL) BY MOUTH 2 (TWO) TIMES DAILY. 60 tablet 10  . flecainide (TAMBOCOR) 100 MG tablet TAKE 1 TABLET (100 MG TOTAL) BY MOUTH 2 (TWO) TIMES DAILY. 60 tablet 1  . lisinopril (PRINIVIL,ZESTRIL) 10 MG tablet Take 1 tablet (10 mg total) by mouth daily. 30 tablet 3  . lisinopril (PRINIVIL,ZESTRIL) 10 MG tablet TAKE ONE TABLET BY MOUTH DAILY 30 tablet 0   No current facility-administered medications for this visit.    Allergies  Review of patient's allergies indicates no known allergies.  Electrocardiogram: 12/29  SR rate 60 PR 216 PAC today 12/24/14  SR rate 64 PR 210 LBBB no sig change   Assessment and Plan  HTN:  Well controlled.  Continue current medications and low sodium Dash type diet.    LBBB:  Stable no high grade AV block yearly ECG  PAF  maint NSR continue flecainide

## 2015-07-01 ENCOUNTER — Ambulatory Visit (INDEPENDENT_AMBULATORY_CARE_PROVIDER_SITE_OTHER): Payer: Medicare Other | Admitting: Cardiovascular Disease

## 2015-07-01 ENCOUNTER — Encounter: Payer: Self-pay | Admitting: Cardiovascular Disease

## 2015-07-01 VITALS — BP 148/70 | HR 59 | Ht 66.0 in | Wt 153.1 lb

## 2015-07-01 DIAGNOSIS — I10 Essential (primary) hypertension: Secondary | ICD-10-CM

## 2015-07-14 ENCOUNTER — Other Ambulatory Visit: Payer: Self-pay | Admitting: Cardiovascular Disease

## 2015-09-03 DIAGNOSIS — E538 Deficiency of other specified B group vitamins: Secondary | ICD-10-CM | POA: Diagnosis not present

## 2015-09-03 DIAGNOSIS — M810 Age-related osteoporosis without current pathological fracture: Secondary | ICD-10-CM | POA: Diagnosis not present

## 2015-09-03 DIAGNOSIS — E039 Hypothyroidism, unspecified: Secondary | ICD-10-CM | POA: Diagnosis not present

## 2015-09-03 DIAGNOSIS — I10 Essential (primary) hypertension: Secondary | ICD-10-CM | POA: Diagnosis not present

## 2015-09-06 DIAGNOSIS — Z23 Encounter for immunization: Secondary | ICD-10-CM | POA: Diagnosis not present

## 2015-09-16 DIAGNOSIS — I10 Essential (primary) hypertension: Secondary | ICD-10-CM | POA: Diagnosis not present

## 2015-09-16 DIAGNOSIS — M810 Age-related osteoporosis without current pathological fracture: Secondary | ICD-10-CM | POA: Diagnosis not present

## 2015-09-16 DIAGNOSIS — I480 Paroxysmal atrial fibrillation: Secondary | ICD-10-CM | POA: Diagnosis not present

## 2015-09-16 DIAGNOSIS — E039 Hypothyroidism, unspecified: Secondary | ICD-10-CM | POA: Diagnosis not present

## 2015-11-25 DIAGNOSIS — M810 Age-related osteoporosis without current pathological fracture: Secondary | ICD-10-CM | POA: Diagnosis not present

## 2015-11-25 DIAGNOSIS — I480 Paroxysmal atrial fibrillation: Secondary | ICD-10-CM | POA: Diagnosis not present

## 2015-11-25 DIAGNOSIS — E039 Hypothyroidism, unspecified: Secondary | ICD-10-CM | POA: Diagnosis not present

## 2015-11-25 DIAGNOSIS — I10 Essential (primary) hypertension: Secondary | ICD-10-CM | POA: Diagnosis not present

## 2016-01-03 NOTE — Progress Notes (Signed)
Patient ID: Amanda Olsen, female   DOB: October 29, 1934, 82 y.o.   MRN: [id number]   Amanda Olsen is seen today for F/U of HTN and PAF. She has maintained NSR on Flecainide. She did not have proarrythmia on ETT. She has good LV function and no CAD by cath. Her last visit we started ACE for BP contorl. She sold her apartment there and is living independanlty in an apartment in [place]. She denies SSCP, palpitations, syncope, edema or dyspnea. I reviewed lab work from 04/08/10 and her K was 3.9 with Cr .7 and normal LFTS. LDL was 103. She has a component of white coat hypertension. Has son and daughter in law with two grandchildren in [place] but misses [place] where she is from. Does not drive Seen in ER 30/16 for palpitations but in NSR. Has white coat syndrome and home BP is in the 120 systolic range  Labs reviewed from [facility] and normla LDL was 99   Seen by Norma Fredrickson in November and BP high started on HCTZ but this dropped her BP too low and she does not like the way HCTZ makes her feel    Needs refill on flecainide   ROS: Denies fever, malais, weight loss, blurry vision, decreased visual acuity, cough, sputum, SOB, hemoptysis, pleuritic pain, palpitaitons, heartburn, abdominal pain, melena, lower extremity edema, claudication, or rash.  All other systems reviewed and negative  General: Affect appropriate Healthy:  appears stated age HEENT: normal  Roscase[place] Neck supple with no adenopathy JVP normal no bruits no thyromegaly Lungs clear with no wheezing and good diaphragmatic motion Heart:  S1/S2 no murmur, no rub, gallop or click PMI normal Abdomen: benighn, BS positve, no tenderness, no AAA no bruit.  No HSM or HJR Distal pulses intact with no bruits No edema Neuro non-focal Skin warm and dry No muscular weakness   Current Outpatient Prescriptions  Medication Sig Dispense Refill  . aspirin EC 81 MG tablet Take 81 mg by mouth daily.    . calcium carbonate  (OS-CAL) 600 MG TABS Take 600 mg by mouth daily.      . Cholecalciferol (VITAMIN D) 2000 UNITS tablet Take 2,000 Units by mouth daily.     . cyanocobalamin 2000 MCG tablet Take 2,050 mcg by mouth daily.     . flecainide (TAMBOCOR) 100 MG tablet TAKE 1 TABLET (100 MG TOTAL) BY MOUTH 2 (TWO) TIMES DAILY. 60 tablet 10  . lisinopril (PRINIVIL,ZESTRIL) 10 MG tablet TAKE ONE TABLET BY MOUTH DAILY 30 tablet 10   No current facility-administered medications for this visit.    Allergies  Review of patient's allergies indicates no known allergies.  Electrocardiogram: 12/29  SR rate 60 PR 216 PAC today 12/24/14  SR rate 64 PR 210 LBBB no sig change 01/07/16  SR rate 63  LBBB   Assessment and Plan  HTN:  D/C HCTZ  Increase lisinopril to 10 bid   LBBB:  Stable no high grade AV block yearly ECG  PAF  maint NSR continue flecainide    F/U with me in 3 months   Charlton Haws

## 2016-01-04 ENCOUNTER — Encounter: Payer: Self-pay | Admitting: Cardiovascular Disease

## 2016-01-06 ENCOUNTER — Ambulatory Visit (INDEPENDENT_AMBULATORY_CARE_PROVIDER_SITE_OTHER): Payer: Medicare Other | Admitting: Cardiovascular Disease

## 2016-01-06 ENCOUNTER — Encounter: Payer: Self-pay | Admitting: Cardiovascular Disease

## 2016-01-06 VITALS — BP 152/80 | HR 69 | Ht 66.0 in | Wt 158.8 lb

## 2016-01-06 DIAGNOSIS — I4891 Unspecified atrial fibrillation: Secondary | ICD-10-CM | POA: Diagnosis not present

## 2016-01-06 DIAGNOSIS — I10 Essential (primary) hypertension: Secondary | ICD-10-CM

## 2016-01-06 MED ORDER — LISINOPRIL 10 MG PO TABS: 10.0000 mg | ORAL_TABLET | Freq: Two times a day (BID) | ORAL | Status: DC

## 2016-01-06 NOTE — Patient Instructions (Addendum)
Medication Instructions:  Your physician has recommended you make the following change in your medication:  1-STOP hydrochlorothiazide 2-Increase Lisinopril 10 mg by mouth twice daily.  Labwork: NONE  Testing/Procedures: NONE  Follow-Up: Your physician recommends that you schedule a follow-up appointment in: 3 months with Dr. Eden Emms.   If you need a refill on your cardiac medications before your next appointment, please call your pharmacy.

## 2016-03-06 ENCOUNTER — Telehealth: Payer: Self-pay | Admitting: *Deleted

## 2016-03-06 NOTE — Telephone Encounter (Signed)
Called granddaughter Dwana Curd, patient emergency contact. Dwana Curd states patient has a hard time understanding and gets very nervous when talking to health care professionals. Dwana Curd stated that patient had extra HCTZ and has been taking it again. Patient thinks that taking lisinopril daily and taking HCTZ daily is controlling her BP better than just lisinopril twice daily.  Dwana Curd is going to get patient's recent BP's and HR, so Dr. Eden Emms will have some data to go by.  Yesterday morning BP 135/70 HR 65                  Evening BP 123/68 HR 54 This morning          BP 123/65 HR 62  These reading are with patient taking both medications once daily. Patient wants to go back to taking HCTZ and lisionpril daily. Will forward to Dr. Eden Emms for recommendations.

## 2016-03-06 NOTE — Telephone Encounter (Signed)
Patients grandaughter Dwana Curd, called and stated that the patient would like to go back on the hctz as she felt that her bp was better controlled while taking this medication. Dwana Curd can be reached at [phone]. Please advise. Thanks, MI

## 2016-03-07 MED ORDER — LISINOPRIL 10 MG PO TABS: 10.0000 mg | ORAL_TABLET | Freq: Every day | ORAL | Status: DC

## 2016-03-07 MED ORDER — HYDROCHLOROTHIAZIDE 12.5 MG PO CAPS: 12.5000 mg | ORAL_CAPSULE | Freq: Every day | ORAL | Status: DC

## 2016-03-07 NOTE — Telephone Encounter (Signed)
Dr. Eden Emms is fine with patient taking HCTZ and lisinopril daily. Sent order in for HCTZ to patient's pharmacy of choice and updated patient's medication list. Called granddaughter Dwana Curd back to let her know of changes.

## 2016-03-09 ENCOUNTER — Encounter: Payer: Self-pay | Admitting: Cardiovascular Disease

## 2016-03-09 DIAGNOSIS — Z1389 Encounter for screening for other disorder: Secondary | ICD-10-CM | POA: Diagnosis not present

## 2016-03-09 DIAGNOSIS — E663 Overweight: Secondary | ICD-10-CM | POA: Diagnosis not present

## 2016-03-09 DIAGNOSIS — I10 Essential (primary) hypertension: Secondary | ICD-10-CM | POA: Diagnosis not present

## 2016-03-09 DIAGNOSIS — E538 Deficiency of other specified B group vitamins: Secondary | ICD-10-CM | POA: Diagnosis not present

## 2016-03-09 DIAGNOSIS — M810 Age-related osteoporosis without current pathological fracture: Secondary | ICD-10-CM | POA: Diagnosis not present

## 2016-03-09 DIAGNOSIS — I129 Hypertensive chronic kidney disease with stage 1 through stage 4 chronic kidney disease, or unspecified chronic kidney disease: Secondary | ICD-10-CM | POA: Diagnosis not present

## 2016-03-09 DIAGNOSIS — Z0000 Encounter for general adult medical examination without abnormal findings: Secondary | ICD-10-CM | POA: Diagnosis not present

## 2016-03-09 DIAGNOSIS — E559 Vitamin D deficiency, unspecified: Secondary | ICD-10-CM | POA: Diagnosis not present

## 2016-03-16 DIAGNOSIS — I129 Hypertensive chronic kidney disease with stage 1 through stage 4 chronic kidney disease, or unspecified chronic kidney disease: Secondary | ICD-10-CM | POA: Diagnosis not present

## 2016-03-16 DIAGNOSIS — D709 Neutropenia, unspecified: Secondary | ICD-10-CM | POA: Diagnosis not present

## 2016-03-16 DIAGNOSIS — I4891 Unspecified atrial fibrillation: Secondary | ICD-10-CM | POA: Diagnosis not present

## 2016-03-16 DIAGNOSIS — N182 Chronic kidney disease, stage 2 (mild): Secondary | ICD-10-CM | POA: Diagnosis not present

## 2016-04-04 NOTE — Progress Notes (Signed)
Patient ID: Amanda Olsen, female   DOB: December 04, 1934, 82 y.o.   MRN: [id number]   Amanda Olsen is seen today for F/U of HTN and PAF. She has maintained NSR on Flecainide. She did not have proarrythmia on ETT. She has good LV function and no CAD by cath. Her last visit we started ACE for BP contorl. She sold her apartment there and is living independanlty in an apartment in [place]. She denies SSCP, palpitations, syncope, edema or dyspnea. I reviewed lab work from 04/08/10 and her K was 3.9 with Cr .7 and normal LFTS. LDL was 103. She has a component of white coat hypertension. Has son and daughter in law with two grandchildren in [place] but misses [place] where she is from. Does not drive Seen in ER 29/57 for palpitations but in NSR. Has white coat syndrome and home BP is in the 120 systolic range  Labs reviewed from [facility] and normla LDL was 99   BP seems best controlled when she take diuretic with ACE    Needs refill on flecainide  ROS: Denies fever, malais, weight loss, blurry vision, decreased visual acuity, cough, sputum, SOB, hemoptysis, pleuritic pain, palpitaitons, heartburn, abdominal pain, melena, lower extremity edema, claudication, or rash.  All other systems reviewed and negative  General: Affect appropriate Healthy:  appears stated age HEENT: normal  Roscase[place] Neck supple with no adenopathy JVP normal no bruits no thyromegaly Lungs clear with no wheezing and good diaphragmatic motion Heart:  S1/S2 no murmur, no rub, gallop or click PMI normal Abdomen: benighn, BS positve, no tenderness, no AAA no bruit.  No HSM or HJR Distal pulses intact with no bruits No edema Neuro non-focal Skin warm and dry No muscular weakness   Lab Results  Component Value Date   CREATININE 0.7 10/10/2013   BUN 13 10/10/2013   NA 139 10/10/2013   K 4.1 10/10/2013   CL 104 10/10/2013   CO2 28 10/10/2013     Current Outpatient Prescriptions  Medication Sig Dispense  Refill  . aspirin EC 81 MG tablet Take 81 mg by mouth daily.    . calcium carbonate (OS-CAL) 600 MG TABS Take 600 mg by mouth daily.      . Cholecalciferol (VITAMIN D) 2000 UNITS tablet Take 2,000 Units by mouth daily.     . cyanocobalamin 2000 MCG tablet Take 2,050 mcg by mouth daily.     . flecainide (TAMBOCOR) 100 MG tablet TAKE 1 TABLET (100 MG TOTAL) BY MOUTH 2 (TWO) TIMES DAILY. 60 tablet 10  . hydrochlorothiazide (MICROZIDE) 12.5 MG capsule Take 1 capsule (12.5 mg total) by mouth daily. 90 capsule 3  . lisinopril (PRINIVIL,ZESTRIL) 10 MG tablet Take 1 tablet (10 mg total) by mouth daily. 30 tablet 10   No current facility-administered medications for this visit.    Allergies  Review of patient's allergies indicates no known allergies.  Electrocardiogram: 12/29  SR rate 60 PR 216 PAC today 12/24/14  SR rate 64 PR 210 LBBB no sig change 01/07/16  SR rate 63  LBBB   Assessment and Plan  HTN:  Best with both lisinopril and HCTZ   LBBB:  Stable no high grade AV block yearly ECG  PAF  maint NSR continue flecainide  F/U BMET since she is on diuretic    F/U with me in 3 months   Charlton Haws

## 2016-04-06 ENCOUNTER — Ambulatory Visit: Payer: Self-pay | Admitting: Cardiovascular Disease

## 2016-04-11 ENCOUNTER — Ambulatory Visit (INDEPENDENT_AMBULATORY_CARE_PROVIDER_SITE_OTHER): Payer: Medicare Other | Admitting: Cardiovascular Disease

## 2016-04-11 ENCOUNTER — Encounter: Payer: Self-pay | Admitting: Cardiovascular Disease

## 2016-04-11 VITALS — BP 140/70 | HR 64 | Ht 66.0 in | Wt 158.8 lb

## 2016-04-11 DIAGNOSIS — I10 Essential (primary) hypertension: Secondary | ICD-10-CM

## 2016-04-11 DIAGNOSIS — I4891 Unspecified atrial fibrillation: Secondary | ICD-10-CM | POA: Diagnosis not present

## 2016-04-11 NOTE — Patient Instructions (Addendum)

## 2016-05-11 DIAGNOSIS — M25561 Pain in right knee: Secondary | ICD-10-CM | POA: Diagnosis not present

## 2016-05-11 DIAGNOSIS — M179 Osteoarthritis of knee, unspecified: Secondary | ICD-10-CM | POA: Diagnosis not present

## 2016-05-23 DIAGNOSIS — M25561 Pain in right knee: Secondary | ICD-10-CM | POA: Diagnosis not present

## 2016-05-29 DIAGNOSIS — M25561 Pain in right knee: Secondary | ICD-10-CM | POA: Diagnosis not present

## 2016-05-31 DIAGNOSIS — M25561 Pain in right knee: Secondary | ICD-10-CM | POA: Diagnosis not present

## 2016-06-05 DIAGNOSIS — M25561 Pain in right knee: Secondary | ICD-10-CM | POA: Diagnosis not present

## 2016-06-07 DIAGNOSIS — M25561 Pain in right knee: Secondary | ICD-10-CM | POA: Diagnosis not present

## 2016-06-12 DIAGNOSIS — M25561 Pain in right knee: Secondary | ICD-10-CM | POA: Diagnosis not present

## 2016-06-14 DIAGNOSIS — M25561 Pain in right knee: Secondary | ICD-10-CM | POA: Diagnosis not present

## 2016-06-19 DIAGNOSIS — M25561 Pain in right knee: Secondary | ICD-10-CM | POA: Diagnosis not present

## 2016-07-08 ENCOUNTER — Other Ambulatory Visit: Payer: Self-pay | Admitting: Cardiovascular Disease

## 2016-09-07 DIAGNOSIS — E039 Hypothyroidism, unspecified: Secondary | ICD-10-CM | POA: Diagnosis not present

## 2016-09-07 DIAGNOSIS — E538 Deficiency of other specified B group vitamins: Secondary | ICD-10-CM | POA: Diagnosis not present

## 2016-09-07 DIAGNOSIS — I10 Essential (primary) hypertension: Secondary | ICD-10-CM | POA: Diagnosis not present

## 2016-09-07 DIAGNOSIS — M810 Age-related osteoporosis without current pathological fracture: Secondary | ICD-10-CM | POA: Diagnosis not present

## 2016-09-07 DIAGNOSIS — I129 Hypertensive chronic kidney disease with stage 1 through stage 4 chronic kidney disease, or unspecified chronic kidney disease: Secondary | ICD-10-CM | POA: Diagnosis not present

## 2016-09-07 DIAGNOSIS — E789 Disorder of lipoprotein metabolism, unspecified: Secondary | ICD-10-CM | POA: Diagnosis not present

## 2016-09-08 DIAGNOSIS — Z23 Encounter for immunization: Secondary | ICD-10-CM | POA: Diagnosis not present

## 2016-09-14 DIAGNOSIS — I4891 Unspecified atrial fibrillation: Secondary | ICD-10-CM | POA: Diagnosis not present

## 2016-09-14 DIAGNOSIS — I129 Hypertensive chronic kidney disease with stage 1 through stage 4 chronic kidney disease, or unspecified chronic kidney disease: Secondary | ICD-10-CM | POA: Diagnosis not present

## 2016-09-14 DIAGNOSIS — D709 Neutropenia, unspecified: Secondary | ICD-10-CM | POA: Diagnosis not present

## 2016-09-14 DIAGNOSIS — N182 Chronic kidney disease, stage 2 (mild): Secondary | ICD-10-CM | POA: Diagnosis not present

## 2016-10-20 NOTE — Progress Notes (Signed)
Patient ID: Amanda Olsen, female   DOB: February 17, 1934, 82 y.o.   MRN: [id number]   Amanda Olsen is seen today for F/U of HTN and PAF. She has maintained NSR on Flecainide. She did not have proarrythmia on ETT. She has good LV function and no CAD by cath. Her last visit we started ACE for BP contorl. She sold her apartment there and is living independanlty in an apartment in [place]. She denies SSCP, palpitations, syncope, edema or dyspnea. I reviewed lab work from 04/08/10 and her K was 3.9 with Cr .7 and normal LFTS. LDL was 103. She has a component of white coat hypertension. Has son and daughter in law with two grandchildren in [place] but misses [place] where she is from. Does not drive Seen in ER 61/22 for palpitations but in NSR. Has white coat syndrome and home BP is in the 120 systolic range  Labs reviewed from [facility] and normla LDL was 99   BP seems best controlled when she take diuretic with ACE   One of my patient [name] from [facility] drove her to appt today   ROS: Denies fever, malais, weight loss, blurry vision, decreased visual acuity, cough, sputum, SOB, hemoptysis, pleuritic pain, palpitaitons, heartburn, abdominal pain, melena, lower extremity edema, claudication, or rash.  All other systems reviewed and negative  General: Affect appropriate Healthy:  appears stated age HEENT: normal  Roscasea Neck supple with no adenopathy JVP normal no bruits no thyromegaly Lungs clear with no wheezing and good diaphragmatic motion Heart:  S1/S2 no murmur, no rub, gallop or click PMI normal Abdomen: benighn, BS positve, no tenderness, no AAA no bruit.  No HSM or HJR Distal pulses intact with no bruits No edema Neuro non-focal Skin warm and dry No muscular weakness   Lab Results  Component Value Date   CREATININE 0.7 10/10/2013   BUN 13 10/10/2013   NA 139 10/10/2013   K 4.1 10/10/2013   CL 104 10/10/2013   CO2 28 10/10/2013     Current Outpatient  Prescriptions  Medication Sig Dispense Refill  . aspirin EC 81 MG tablet Take 81 mg by mouth daily.    . calcium carbonate (OS-CAL) 600 MG TABS Take 600 mg by mouth daily.      . Cholecalciferol (VITAMIN D) 2000 UNITS tablet Take 2,000 Units by mouth daily.     . cyanocobalamin 2000 MCG tablet Take 2,050 mcg by mouth daily.     . flecainide (TAMBOCOR) 100 MG tablet TAKE 1 TABLET (100 MG TOTAL) BY MOUTH 2 (TWO) TIMES DAILY. 60 tablet 9  . hydrochlorothiazide (MICROZIDE) 12.5 MG capsule Take 1 capsule (12.5 mg total) by mouth daily. 90 capsule 3  . lisinopril (PRINIVIL,ZESTRIL) 10 MG tablet Take 1 tablet (10 mg total) by mouth daily. 30 tablet 10   No current facility-administered medications for this visit.     Allergies  Patient has no known allergies.  Electrocardiogram: 12/29  SR rate 60 PR 216 PAC today 12/24/14  SR rate 64 PR 210 LBBB no sig change 01/07/16  SR rate 63  LBBB  10/23/16 SR rate 58 PR 232 LBBB   Assessment and Plan  HTN:  Best with both lisinopril and HCTZ   LBBB:  Stable no high grade AV block yearly ECG  PAF  maint NSR continue flecainide     F/U with me in 6 months   Charlton Haws

## 2016-10-23 ENCOUNTER — Ambulatory Visit (INDEPENDENT_AMBULATORY_CARE_PROVIDER_SITE_OTHER): Payer: Medicare Other | Admitting: Cardiovascular Disease

## 2016-10-23 ENCOUNTER — Encounter: Payer: Self-pay | Admitting: Cardiovascular Disease

## 2016-10-23 VITALS — BP 130/80 | HR 62 | Ht 66.0 in | Wt 157.6 lb

## 2016-10-23 DIAGNOSIS — I4891 Unspecified atrial fibrillation: Secondary | ICD-10-CM

## 2016-10-23 NOTE — Patient Instructions (Signed)

## 2017-02-07 ENCOUNTER — Other Ambulatory Visit: Payer: Self-pay | Admitting: Cardiovascular Disease

## 2017-03-27 ENCOUNTER — Emergency Department (HOSPITAL_COMMUNITY)
Admission: EM | Admit: 2017-03-27 | Discharge: 2017-03-27 | Disposition: A | Discharge status: Home / Self Care | Payer: Medicare Other | Attending: Emergency Medicine | Admitting: Emergency Medicine

## 2017-03-27 ENCOUNTER — Encounter (HOSPITAL_COMMUNITY): Payer: Self-pay

## 2017-03-27 ENCOUNTER — Emergency Department (HOSPITAL_COMMUNITY): Payer: Medicare Other

## 2017-03-27 DIAGNOSIS — R112 Nausea with vomiting, unspecified: Secondary | ICD-10-CM | POA: Insufficient documentation

## 2017-03-27 DIAGNOSIS — E039 Hypothyroidism, unspecified: Secondary | ICD-10-CM | POA: Diagnosis not present

## 2017-03-27 DIAGNOSIS — R197 Diarrhea, unspecified: Secondary | ICD-10-CM | POA: Diagnosis not present

## 2017-03-27 DIAGNOSIS — I10 Essential (primary) hypertension: Secondary | ICD-10-CM | POA: Diagnosis not present

## 2017-03-27 DIAGNOSIS — R0689 Other abnormalities of breathing: Secondary | ICD-10-CM | POA: Insufficient documentation

## 2017-03-27 DIAGNOSIS — R55 Syncope and collapse: Secondary | ICD-10-CM | POA: Diagnosis not present

## 2017-03-27 DIAGNOSIS — Z7982 Long term (current) use of aspirin: Secondary | ICD-10-CM | POA: Insufficient documentation

## 2017-03-27 DIAGNOSIS — I4891 Unspecified atrial fibrillation: Secondary | ICD-10-CM | POA: Diagnosis not present

## 2017-03-27 DIAGNOSIS — R404 Transient alteration of awareness: Secondary | ICD-10-CM | POA: Diagnosis not present

## 2017-03-27 DIAGNOSIS — R000 Tachycardia, unspecified: Secondary | ICD-10-CM | POA: Diagnosis not present

## 2017-03-27 LAB — CBC WITH DIFFERENTIAL/PLATELET
Basophils Absolute: 0 10*3/uL (ref 0.0–0.1)
Basophils Relative: 0 %
Eosinophils Absolute: 0.1 10*3/uL (ref 0.0–0.7)
Eosinophils Relative: 1 %
HCT: 41.3 % (ref 36.0–46.0)
Hemoglobin: 13.6 g/dL (ref 12.0–15.0)
Lymphocytes Relative: 7 %
Lymphs Abs: 0.7 10*3/uL (ref 0.7–4.0)
MCH: 30 pg (ref 26.0–34.0)
MCHC: 32.9 g/dL (ref 30.0–36.0)
MCV: 91.2 fL (ref 78.0–100.0)
Monocytes Absolute: 0.5 10*3/uL (ref 0.1–1.0)
Monocytes Relative: 6 %
Neutro Abs: 8.3 10*3/uL — ABNORMAL HIGH (ref 1.7–7.7)
Neutrophils Relative %: 86 %
Platelets: 166 10*3/uL (ref 150–400)
RBC: 4.53 MIL/uL (ref 3.87–5.11)
RDW: 13.2 % (ref 11.5–15.5)
WBC: 9.7 10*3/uL (ref 4.0–10.5)

## 2017-03-27 LAB — LIPASE, BLOOD: Lipase: 25 U/L (ref 11–51)

## 2017-03-27 LAB — URINALYSIS, ROUTINE W REFLEX MICROSCOPIC
Bacteria, UA: NONE SEEN
Bilirubin Urine: NEGATIVE
Glucose, UA: NEGATIVE mg/dL
Ketones, ur: 5 mg/dL — AB
Leukocytes, UA: NEGATIVE
Nitrite: NEGATIVE
Protein, ur: 30 mg/dL — AB
Specific Gravity, Urine: 1.008 (ref 1.005–1.030)
pH: 7 (ref 5.0–8.0)

## 2017-03-27 LAB — I-STAT TROPONIN, ED: Troponin i, poc: 0 ng/mL (ref 0.00–0.08)

## 2017-03-27 LAB — COMPREHENSIVE METABOLIC PANEL
ALT: 26 U/L (ref 14–54)
AST: 35 U/L (ref 15–41)
Albumin: 4.2 g/dL (ref 3.5–5.0)
Alkaline Phosphatase: 64 U/L (ref 38–126)
Anion gap: 13 (ref 5–15)
BUN: 13 mg/dL (ref 6–20)
CO2: 22 mmol/L (ref 22–32)
Calcium: 9 mg/dL (ref 8.9–10.3)
Chloride: 100 mmol/L — ABNORMAL LOW (ref 101–111)
Creatinine, Ser: 0.83 mg/dL (ref 0.44–1.00)
GFR calc Af Amer: >60 mL/min (ref >60)
GFR calc non Af Amer: >60 mL/min (ref >60)
Glucose, Bld: 133 mg/dL — ABNORMAL HIGH (ref 65–99)
Potassium: 3.9 mmol/L (ref 3.5–5.1)
Sodium: 135 mmol/L (ref 135–145)
Total Bilirubin: 0.4 mg/dL (ref 0.3–1.2)
Total Protein: 7.6 g/dL (ref 6.5–8.1)

## 2017-03-27 LAB — BRAIN NATRIURETIC PEPTIDE: B Natriuretic Peptide: 161.2 pg/mL — ABNORMAL HIGH (ref 0.0–100.0)

## 2017-03-27 LAB — MAGNESIUM: Magnesium: 1.9 mg/dL (ref 1.7–2.4)

## 2017-03-27 MED ORDER — METOPROLOL TARTRATE 5 MG/5ML IV SOLN
5.0000 mg | Freq: Once | INTRAVENOUS | Status: AC
  Administered 2017-03-27: 5 mg via INTRAVENOUS
  Filled 2017-03-27: qty 5

## 2017-03-27 MED ORDER — ONDANSETRON HCL 4 MG/2ML IJ SOLN
4.0000 mg | Freq: Once | INTRAMUSCULAR | Status: AC
  Administered 2017-03-27: 4 mg via INTRAVENOUS
  Filled 2017-03-27: qty 2

## 2017-03-27 MED ORDER — METOPROLOL TARTRATE 25 MG PO TABS
25.0000 mg | ORAL_TABLET | Freq: Once | ORAL | Status: AC
  Administered 2017-03-27: 25 mg via ORAL
  Filled 2017-03-27: qty 1

## 2017-03-27 MED ORDER — SODIUM CHLORIDE 0.9 % IV BOLUS (SEPSIS)
1000.0000 mL | Freq: Once | INTRAVENOUS | Status: AC
  Administered 2017-03-27: 1000 mL via INTRAVENOUS

## 2017-03-27 NOTE — ED Provider Notes (Signed)
[facility]PT Provider Note   CSN: [id number] Arrival date & time: 03/27/17  1857     History   Chief Complaint Chief Complaint  Patient presents with  . Atrial Fibrillation    pt arrives in rapid AFIB has no hx of this     HPI Amanda Olsen is a 83 y.o. female.  The history is provided by the patient.  Atrial Fibrillation  This is a recurrent problem. The current episode started less than 1 hour ago. The problem occurs constantly. The problem has been gradually worsening. Pertinent negatives include no chest pain, no abdominal pain, no headaches and no shortness of breath. Nothing aggravates the symptoms. Nothing relieves the symptoms. She has tried nothing for the symptoms. The treatment provided no relief.    Past Medical History:  Diagnosis Date  . Atrial fibrillation (HCC)    maintained in sinus on Flecainide  . Cataract   . ELECTROCARDIOGRAM, ABNORMAL   . HTN (hypertension)   . Rosacea     Patient Active Problem List   Diagnosis Date Noted  . Hypothyroid 11/16/2011  . ELECTROCARDIOGRAM, ABNORMAL 05/02/2010  . ESSENTIAL HYPERTENSION, BENIGN 04/15/2009  . ROSACEA 04/15/2009  . ATRIAL FIBRILLATION 10/16/2008    Past Surgical History:  Procedure Laterality Date  . VARICOSE VEIN SURGERY      OB History    No data available       Home Medications    Prior to Admission medications   Medication Sig Start Date End Date Taking? Authorizing Provider  aspirin EC 81 MG tablet Take 81 mg by mouth daily.    Historical Provider, MD  calcium carbonate (OS-CAL) 600 MG TABS Take 600 mg by mouth daily.      Historical Provider, MD  Cholecalciferol (VITAMIN D) 2000 UNITS tablet Take 2,000 Units by mouth daily.     Historical Provider, MD  cyanocobalamin 2000 MCG tablet Take 2,050 mcg by mouth daily.     Historical Provider, MD  flecainide (TAMBOCOR) 100 MG tablet TAKE 1 TABLET (100 MG TOTAL) BY MOUTH 2 (TWO) TIMES DAILY. 07/10/16   Wendall Stade, MD    hydrochlorothiazide (MICROZIDE) 12.5 MG capsule Take 1 capsule (12.5 mg total) by mouth daily. 03/07/16   Wendall Stade, MD  lisinopril (PRINIVIL,ZESTRIL) 10 MG tablet TAKE 1 TABLET (10 MG TOTAL) BY MOUTH 2 (TWO) TIMES DAILY. 02/07/17   Wendall Stade, MD    Family History Family History  Problem Relation Age of Onset  . Heart Problems Mother     Social History Social History  Substance Use Topics  . Smoking status: Never Smoker  . Smokeless tobacco: Never Used  . Alcohol use No     Allergies   Patient has no known allergies.   Review of Systems Review of Systems  Constitutional: Positive for chills. Negative for fever.  HENT: Negative for ear pain and sore throat.   Eyes: Negative for pain and visual disturbance.  Respiratory: Negative for cough and shortness of breath.   Cardiovascular: Positive for palpitations. Negative for chest pain.  Gastrointestinal: Positive for diarrhea, nausea and vomiting. Negative for abdominal pain.  Genitourinary: Negative for dysuria and hematuria.  Musculoskeletal: Negative for arthralgias and back pain.  Skin: Negative for color change and rash.  Neurological: Negative for seizures, syncope and headaches.  Psychiatric/Behavioral: Negative for confusion.  All other systems reviewed and are negative.    Physical Exam Updated Vital Signs BP 125/72   Pulse (!) 52   Temp 97.9 F (  36.6 C) (Oral)   Resp 12   Ht 5\' 3"  (1.6 m)   Wt 74.8 kg   SpO2 96%   BMI 29.23 kg/m   Physical Exam  Constitutional: She appears well-developed and well-nourished. No distress.  HENT:  Head: Normocephalic and atraumatic.  Eyes: Conjunctivae and EOM are normal.  Neck: Neck supple.  Cardiovascular: An irregularly irregular rhythm present. Tachycardia present.   No murmur heard. Pulmonary/Chest: Effort normal and breath sounds normal. No respiratory distress.  Abdominal: Soft. There is no tenderness.  Musculoskeletal: She exhibits no edema.   Neurological: She is alert.  Skin: Skin is warm and dry.  Psychiatric: She has a normal mood and affect.  Nursing note and vitals reviewed.    ED Treatments / Results  Labs (all labs ordered are listed, but only abnormal results are displayed) Labs Reviewed  BRAIN NATRIURETIC PEPTIDE - Abnormal; Notable for the following:       Result Value   B Natriuretic Peptide 161.2 (*)    All other components within normal limits  CBC WITH DIFFERENTIAL/PLATELET - Abnormal; Notable for the following:    Neutro Abs 8.3 (*)    All other components within normal limits  COMPREHENSIVE METABOLIC PANEL - Abnormal; Notable for the following:    Chloride 100 (*)    Glucose, Bld 133 (*)    All other components within normal limits  URINALYSIS, ROUTINE W REFLEX MICROSCOPIC - Abnormal; Notable for the following:    Color, Urine STRAW (*)    Hgb urine dipstick SMALL (*)    Ketones, ur 5 (*)    Protein, ur 30 (*)    Squamous Epithelial / LPF 0-5 (*)    All other components within normal limits  MAGNESIUM  LIPASE, BLOOD  I-STAT TROPOININ, ED  I-STAT TROPOININ, ED    EKG  EKG Interpretation  Date/Time:  Tuesday March 27 2017 20:43:03 EDT Ventricular Rate:  56 PR Interval:    QRS Duration: 161 QT Interval:  503 QTC Calculation: 486 R Axis:   52 Text Interpretation:  Sinus rhythm Prolonged PR interval Left bundle branch block Confirmed by DELO  MD, DOUGLAS ([id number]) on 03/27/2017 9:40:30 PM       Radiology Dg Chest Port 1 View  Result Date: 03/27/2017 CLINICAL DATA:  Atrial fibrillation beginning today. EXAM: PORTABLE CHEST 1 VIEW COMPARISON:  09/14/2012 FINDINGS: Lungs are well inflated without focal consolidation or effusion. Cardiomediastinal silhouette is within normal. There is minimal calcified plaque over the thoracic aorta. Mild degenerate change of the spine. IMPRESSION: No acute cardiopulmonary disease. Aortic atherosclerosis. Electronically Signed   By: Elberta Fortis M.D.   On:  03/27/2017 20:23    Procedures Procedures (including critical care time)  Medications Ordered in ED Medications  ondansetron (ZOFRAN) injection 4 mg (4 mg Intravenous Given 03/27/17 1944)  sodium chloride 0.9 % bolus 1,000 mL (0 mLs Intravenous Stopped 03/27/17 1938)  metoprolol (LOPRESSOR) injection 5 mg (5 mg Intravenous Given 03/27/17 1947)  metoprolol tartrate (LOPRESSOR) tablet 25 mg (25 mg Oral Given 03/27/17 1943)     Initial Impression / Assessment and Plan / ED Course  I have reviewed the triage vital signs and the nursing notes.  Pertinent labs & imaging results that were available during my care of the patient were reviewed by me and considered in my medical decision making (see chart for details).     83 year old [place] female with history of known paroxysmal atrial fibrillation on flecainide presents in the setting of atrial fibrillation  with rapid ventricular response. Patient was at home tonight and had dinner out towards she had relatively sudden onset of diarrhea, nausea, vomiting. After this event she noted her heart beating fast and patient brought to the emergency department for further evaluation in setting of palpitations.  On arrival patient reports nausea and abdominal discomfort have resolved. I have suspicion this is likely secondary to foodborne illness. EKG revealed A. fib with RVR and no significant changes from prior EKGs. Patient denies any chest pain, shortness of breath or recent illness other than nausea vomiting diarrhea. Chest strep obtained with no acute cardiopulmonary abnormality. No elevation in troponin. No significant I amount is noted. No signs of pericarditis. Patient was given IV fluids and metoprolol in the emergency department (IV and oral) with return of rhythm to normal sinus rhythm. Patient continued to have no significant complaints in the emergency department.  Believe patient had a bout of paroxysmal atrial fibrillation after stressor of  foodborne illness. As patient's rhythm has returned to normal believe patient is safe for discharge home with plan to call cardiology in the morning for close follow-up. Patient advised to continue home flecainide and do not believe further medicine is indicated for this condition at this time. Strict return percussion is given and patient stable at time of discharge. Patient and family in agreement with plan.  Final Clinical Impressions(s) / ED Diagnoses   Final diagnoses:  Atrial fibrillation (HCC)  Atrial fibrillation with RVR (HCC)  Nausea vomiting and diarrhea    New Prescriptions Discharge Medication List as of 03/27/2017  9:08 PM       Stacy Gardner, MD 03/27/17 [id number]    Geoffery Lyons, MD 03/27/17 2359

## 2017-03-27 NOTE — ED Notes (Signed)
X-ray at bedside

## 2017-03-27 NOTE — ED Notes (Signed)
Patient Alert and oriented X4. Stable and ambulatory. Patient verbalized understanding of the discharge instructions.  Patient belongings were taken by the patient.  

## 2017-03-27 NOTE — ED Triage Notes (Signed)
Pt was on the toilet trying to have a bowel movement when she syncopized lasting approx 30 sec no fall no injury but when EMS arrived pt is found to be in rapid AFIB pt did have multiple episodes of vomitting x6 4mg  IV zofran en route by EMS

## 2017-03-28 DIAGNOSIS — E538 Deficiency of other specified B group vitamins: Secondary | ICD-10-CM | POA: Diagnosis not present

## 2017-03-28 DIAGNOSIS — I10 Essential (primary) hypertension: Secondary | ICD-10-CM | POA: Diagnosis not present

## 2017-03-28 DIAGNOSIS — E559 Vitamin D deficiency, unspecified: Secondary | ICD-10-CM | POA: Diagnosis not present

## 2017-03-28 DIAGNOSIS — E039 Hypothyroidism, unspecified: Secondary | ICD-10-CM | POA: Diagnosis not present

## 2017-03-28 DIAGNOSIS — I129 Hypertensive chronic kidney disease with stage 1 through stage 4 chronic kidney disease, or unspecified chronic kidney disease: Secondary | ICD-10-CM | POA: Diagnosis not present

## 2017-03-29 ENCOUNTER — Encounter: Payer: Self-pay | Admitting: Cardiovascular Disease

## 2017-03-29 ENCOUNTER — Encounter (INDEPENDENT_AMBULATORY_CARE_PROVIDER_SITE_OTHER): Payer: Self-pay

## 2017-03-29 ENCOUNTER — Ambulatory Visit (INDEPENDENT_AMBULATORY_CARE_PROVIDER_SITE_OTHER): Payer: Medicare Other | Admitting: Cardiovascular Disease

## 2017-03-29 VITALS — BP 186/84 | HR 58 | Ht 66.0 in | Wt 153.8 lb

## 2017-03-29 DIAGNOSIS — I10 Essential (primary) hypertension: Secondary | ICD-10-CM

## 2017-03-29 DIAGNOSIS — I4891 Unspecified atrial fibrillation: Secondary | ICD-10-CM | POA: Diagnosis not present

## 2017-03-29 MED ORDER — LISINOPRIL 20 MG PO TABS: 20.0000 mg | ORAL_TABLET | Freq: Every day | ORAL | 3 refills | Status: DC

## 2017-03-29 MED ORDER — RIVAROXABAN 15 MG PO TABS: 15.0000 mg | ORAL_TABLET | Freq: Every day | ORAL | 0 refills | Status: DC

## 2017-03-29 MED ORDER — RIVAROXABAN 15 MG PO TABS: 15.0000 mg | ORAL_TABLET | Freq: Every day | ORAL | 3 refills | Status: DC

## 2017-03-29 NOTE — Patient Instructions (Addendum)
Medication Instructions:  Your physician has recommended you make the following change in your medication:   1-Increase Lisinopril 20 mg by mouth daily 2-START Xarelto 15 mg by mouth daily 3-STOP aspirin  Labwork: NONE  Testing/Procedures: NONE  Follow-Up: Your physician wants you to follow-up in: 6 to 8 weeks with Dr. Eden Emms.    If you need a refill on your cardiac medications before your next appointment, please call your pharmacy.

## 2017-03-29 NOTE — Addendum Note (Signed)
Addended by: Virl Axe, PAMELA L on: 03/29/2017 04:49 PM   Modules accepted: Orders

## 2017-03-29 NOTE — Progress Notes (Signed)
Patient ID: Amanda Olsen, female   DOB: 1934-02-20, 83 y.o.   MRN: [id number]   Amanda Olsen is seen today for F/U of HTN and PAF. She has maintained NSR on Flecainide. She did not have proarrythmia on ETT. She has good LV function and no CAD by cath. Her last visit we started ACE for BP contorl. She sold her apartment there and is living independanlty in an apartment in [place]. She denies SSCP, palpitations, syncope, edema or dyspnea. I reviewed lab work from 04/08/10 and her K was 3.9 with Cr .7 and normal LFTS. LDL was 103. She has a component of white coat hypertension. Has son and daughter in law with two grandchildren in [place] but misses [place] where she is from. Does not drive Seen in ER 70/48 for palpitations but in NSR. Has white coat syndrome and home BP is in the 120 systolic range  Labs reviewed from [facility] and normla LDL was 99   BP seems best controlled when she take diuretic with ACE   One of my patient Amanda Olsen drove her to appt today   She had vomiting and diarrhea on 4/17 and seen in ER with rapid afib. Converted with metroprolol   ROS: Denies fever, malais, weight loss, blurry vision, decreased visual acuity, cough, sputum, SOB, hemoptysis, pleuritic pain, palpitaitons, heartburn, abdominal pain, melena, lower extremity edema, claudication, or rash.  All other systems reviewed and negative  General: Affect appropriate Healthy:  appears stated age HEENT: normal  Roscasea Neck supple with no adenopathy JVP normal no bruits no thyromegaly Lungs clear with no wheezing and good diaphragmatic motion Heart:  S1/S2 no murmur, no rub, gallop or click PMI normal Abdomen: benighn, BS positve, no tenderness, no AAA no bruit.  No HSM or HJR Distal pulses intact with no bruits No edema Neuro non-focal Skin warm and dry No muscular weakness   Lab Results  Component Value Date   CREATININE 0.83 03/27/2017   BUN 13 03/27/2017   NA 135  03/27/2017   K 3.9 03/27/2017   CL 100 (L) 03/27/2017   CO2 22 03/27/2017     Current Outpatient Prescriptions  Medication Sig Dispense Refill  . aspirin EC 81 MG tablet Take 81 mg by mouth daily.    . calcium carbonate (OS-CAL) 600 MG TABS Take 600 mg by mouth daily.      . Cholecalciferol (VITAMIN D) 2000 UNITS tablet Take 2,000 Units by mouth daily.     . cyanocobalamin 2000 MCG tablet Take 2,050 mcg by mouth daily.     . flecainide (TAMBOCOR) 100 MG tablet TAKE 1 TABLET (100 MG TOTAL) BY MOUTH 2 (TWO) TIMES DAILY. 60 tablet 9  . hydrochlorothiazide (MICROZIDE) 12.5 MG capsule Take 1 capsule (12.5 mg total) by mouth daily. 90 capsule 3  . lisinopril (PRINIVIL,ZESTRIL) 20 MG tablet Take 1 tablet (20 mg total) by mouth daily. 90 tablet 3  . Rivaroxaban (XARELTO) 15 MG TABS tablet Take 1 tablet (15 mg total) by mouth daily with supper. 90 tablet 3   No current facility-administered medications for this visit.     Allergies  Patient has no known allergies.  Electrocardiogram: 12/29  SR rate 60 PR 216 PAC today 12/24/14  SR rate 64 PR 210 LBBB no sig change 01/07/16  SR rate 63  LBBB  10/23/16 SR rate 58 PR 232 LBBB   Assessment and Plan  HTN:  Best with both lisinopril and HCTZ Increase to 20 mg  LBBB:  Stable no high grade AV block yearly ECG  PAF  Episode recently ppt by GI illness CHAD2VASC 3 start Xarelto 15 mg given age  Stop ASA    F/U with me in 6 - 8 weeks   Charlton Haws

## 2017-04-02 DIAGNOSIS — I129 Hypertensive chronic kidney disease with stage 1 through stage 4 chronic kidney disease, or unspecified chronic kidney disease: Secondary | ICD-10-CM | POA: Diagnosis not present

## 2017-04-02 DIAGNOSIS — D709 Neutropenia, unspecified: Secondary | ICD-10-CM | POA: Diagnosis not present

## 2017-04-02 DIAGNOSIS — M25552 Pain in left hip: Secondary | ICD-10-CM | POA: Diagnosis not present

## 2017-04-02 DIAGNOSIS — N182 Chronic kidney disease, stage 2 (mild): Secondary | ICD-10-CM | POA: Diagnosis not present

## 2017-04-02 DIAGNOSIS — M5430 Sciatica, unspecified side: Secondary | ICD-10-CM | POA: Diagnosis not present

## 2017-04-02 DIAGNOSIS — M791 Myalgia: Secondary | ICD-10-CM | POA: Diagnosis not present

## 2017-04-02 DIAGNOSIS — I4891 Unspecified atrial fibrillation: Secondary | ICD-10-CM | POA: Diagnosis not present

## 2017-04-02 DIAGNOSIS — Z0000 Encounter for general adult medical examination without abnormal findings: Secondary | ICD-10-CM | POA: Diagnosis not present

## 2017-04-13 DIAGNOSIS — M5432 Sciatica, left side: Secondary | ICD-10-CM | POA: Diagnosis not present

## 2017-04-18 DIAGNOSIS — M545 Low back pain: Secondary | ICD-10-CM | POA: Diagnosis not present

## 2017-04-19 ENCOUNTER — Encounter: Payer: Self-pay | Admitting: Cardiovascular Disease

## 2017-04-23 ENCOUNTER — Ambulatory Visit: Payer: Self-pay | Admitting: Cardiovascular Disease

## 2017-04-27 DIAGNOSIS — M545 Low back pain: Secondary | ICD-10-CM | POA: Diagnosis not present

## 2017-05-07 ENCOUNTER — Other Ambulatory Visit: Payer: Self-pay | Admitting: Cardiovascular Disease

## 2017-05-08 DIAGNOSIS — M545 Low back pain: Secondary | ICD-10-CM | POA: Diagnosis not present

## 2017-05-11 ENCOUNTER — Ambulatory Visit: Payer: Self-pay | Admitting: Cardiovascular Disease

## 2017-05-16 DIAGNOSIS — M545 Low back pain: Secondary | ICD-10-CM | POA: Diagnosis not present

## 2017-05-21 ENCOUNTER — Encounter (HOSPITAL_COMMUNITY): Payer: Self-pay | Admitting: Emergency Medicine

## 2017-05-21 ENCOUNTER — Emergency Department (HOSPITAL_COMMUNITY)
Admission: EM | Admit: 2017-05-21 | Discharge: 2017-05-21 | Disposition: A | Discharge status: Home / Self Care | Payer: Medicare HMO | Attending: Emergency Medicine | Admitting: Emergency Medicine

## 2017-05-21 ENCOUNTER — Emergency Department (HOSPITAL_COMMUNITY): Payer: Medicare HMO

## 2017-05-21 DIAGNOSIS — R079 Chest pain, unspecified: Secondary | ICD-10-CM | POA: Diagnosis not present

## 2017-05-21 DIAGNOSIS — Z7901 Long term (current) use of anticoagulants: Secondary | ICD-10-CM | POA: Insufficient documentation

## 2017-05-21 DIAGNOSIS — I10 Essential (primary) hypertension: Secondary | ICD-10-CM | POA: Insufficient documentation

## 2017-05-21 DIAGNOSIS — E039 Hypothyroidism, unspecified: Secondary | ICD-10-CM | POA: Insufficient documentation

## 2017-05-21 DIAGNOSIS — Z79899 Other long term (current) drug therapy: Secondary | ICD-10-CM | POA: Insufficient documentation

## 2017-05-21 DIAGNOSIS — I480 Paroxysmal atrial fibrillation: Secondary | ICD-10-CM | POA: Diagnosis not present

## 2017-05-21 DIAGNOSIS — I4891 Unspecified atrial fibrillation: Secondary | ICD-10-CM | POA: Diagnosis not present

## 2017-05-21 LAB — BASIC METABOLIC PANEL
Anion gap: 7 (ref 5–15)
BUN: 9 mg/dL (ref 6–20)
CO2: 25 mmol/L (ref 22–32)
Calcium: 8.8 mg/dL — ABNORMAL LOW (ref 8.9–10.3)
Chloride: 105 mmol/L (ref 101–111)
Creatinine, Ser: 0.78 mg/dL (ref 0.44–1.00)
GFR calc Af Amer: >60 mL/min (ref >60)
GFR calc non Af Amer: >60 mL/min (ref >60)
Glucose, Bld: 109 mg/dL — ABNORMAL HIGH (ref 65–99)
Potassium: 3.8 mmol/L (ref 3.5–5.1)
Sodium: 137 mmol/L (ref 135–145)

## 2017-05-21 LAB — CBC
HCT: 35.2 % — ABNORMAL LOW (ref 36.0–46.0)
Hemoglobin: 11.6 g/dL — ABNORMAL LOW (ref 12.0–15.0)
MCH: 30.5 pg (ref 26.0–34.0)
MCHC: 33 g/dL (ref 30.0–36.0)
MCV: 92.6 fL (ref 78.0–100.0)
Platelets: 154 10*3/uL (ref 150–400)
RBC: 3.8 MIL/uL — ABNORMAL LOW (ref 3.87–5.11)
RDW: 13.9 % (ref 11.5–15.5)
WBC: 3.5 10*3/uL — ABNORMAL LOW (ref 4.0–10.5)

## 2017-05-21 LAB — TROPONIN I: Troponin I: <0.03 ng/mL (ref <0.03)

## 2017-05-21 NOTE — ED Provider Notes (Signed)
[facility] Provider Note   CSN: [id number] Arrival date & time: 05/21/17  0569     History   Chief Complaint Chief Complaint  Patient presents with  . Atrial Fibrillation    HPI Amanda Olsen is a 83 y.o. female.  HPI Patient states she awakened at 4 in the morning with her heart fibrillating. She reports it felt like an "bird in her chest". She reports at that time she felt some mild associated tightness. She took a flecainide tablet at approximately 6:30 AM. She reports within an hour she felt improved. Now she reports she feels much more calm, she has no chest pain and no shortness of breath. Patient reports she does not take Xarelto because she is afraid of the bleeding risk. She reports she bruises very easily and in addition to this she has problems with chronic and ongoing low back and leg pain and she somehow feels that the Xarelto would negatively impact that as well. Past Medical History:  Diagnosis Date  . Atrial fibrillation (HCC)    maintained in sinus on Flecainide  . Cataract   . ELECTROCARDIOGRAM, ABNORMAL   . HTN (hypertension)   . Rosacea     Patient Active Problem List   Diagnosis Date Noted  . Hypothyroid 11/16/2011  . ELECTROCARDIOGRAM, ABNORMAL 05/02/2010  . ESSENTIAL HYPERTENSION, BENIGN 04/15/2009  . ROSACEA 04/15/2009  . ATRIAL FIBRILLATION 10/16/2008    Past Surgical History:  Procedure Laterality Date  . VARICOSE VEIN SURGERY      OB History    No data available       Home Medications    Prior to Admission medications   Medication Sig Start Date End Date Taking? Authorizing Provider  calcium carbonate (OS-CAL) 600 MG TABS Take 600 mg by mouth daily.      [provider]  Cholecalciferol (VITAMIN D) 2000 UNITS tablet Take 2,000 Units by mouth daily.     [provider]  cyanocobalamin 2000 MCG tablet Take 2,050 mcg by mouth daily.     [provider]  flecainide (TAMBOCOR) 100 MG tablet TAKE 1  TABLET (100 MG TOTAL) BY MOUTH 2 (TWO) TIMES DAILY. 05/09/17   Wendall Stade, MD  hydrochlorothiazide (MICROZIDE) 12.5 MG capsule TAKE ONE CAPSULE BY MOUTH DAILY 05/09/17   Wendall Stade, MD  lisinopril (PRINIVIL,ZESTRIL) 20 MG tablet Take 1 tablet (20 mg total) by mouth daily. 03/29/17   Wendall Stade, MD  Rivaroxaban (XARELTO) 15 MG TABS tablet Take 1 tablet (15 mg total) by mouth daily with supper. 03/29/17   Wendall Stade, MD  Rivaroxaban (XARELTO) 15 MG TABS tablet Take 1 tablet (15 mg total) by mouth daily with supper. 03/29/17   Wendall Stade, MD    Family History Family History  Problem Relation Age of Onset  . Heart Problems Mother     Social History Social History  Substance Use Topics  . Smoking status: Never Smoker  . Smokeless tobacco: Never Used  . Alcohol use No     Allergies   Patient has no known allergies.   Review of Systems Review of Systems 10 Systems reviewed and are negative for acute change except as noted in the HPI.  Physical Exam Updated Vital Signs BP (!) 142/80   Pulse (!) 57   Temp 98.2 F (36.8 C) (Oral)   Resp (!) 8   Ht 5\' 6"  (1.676 m)   Wt 68 kg (150 lb)   SpO2 97%   BMI  24.21 kg/m   Physical Exam  Constitutional: She is oriented to person, place, and time. She appears well-developed and well-nourished. No distress.  HENT:  Head: Normocephalic and atraumatic.  Eyes: Conjunctivae and EOM are normal.  Neck: Neck supple.  Cardiovascular: Normal rate, regular rhythm, normal heart sounds and intact distal pulses.   No murmur heard. Pulmonary/Chest: Effort normal and breath sounds normal. No respiratory distress.  Abdominal: Soft. She exhibits no distension. There is no tenderness.  Musculoskeletal: Normal range of motion. She exhibits no edema or tenderness.  Neurological: She is alert and oriented to person, place, and time. No cranial nerve deficit. She exhibits normal muscle tone. Coordination normal.  Skin: Skin is warm and  dry.  Psychiatric: She has a normal mood and affect.  Nursing note and vitals reviewed.    ED Treatments / Results  Labs (all labs ordered are listed, but only abnormal results are displayed) Labs Reviewed  BASIC METABOLIC PANEL - Abnormal; Notable for the following:       Result Value   Glucose, Bld 109 (*)    Calcium 8.8 (*)    All other components within normal limits  CBC - Abnormal; Notable for the following:    WBC 3.5 (*)    RBC 3.80 (*)    Hemoglobin 11.6 (*)    HCT 35.2 (*)    All other components within normal limits  TROPONIN I    EKG  EKG Interpretation  Date/Time:  Monday May 21 2017 07:20:24 EDT Ventricular Rate:  65 PR Interval:    QRS Duration: 155 QT Interval:  445 QTC Calculation: 463 R Axis:   24 Text Interpretation:  Sinus or ectopic atrial rhythm Prolonged PR interval IVCD, consider atypical LBBB no sig change from previous except dynamic T V3. Confirmed by Arby Barrette [phone]) on 05/21/2017 7:39:34 AM       Radiology Dg Chest 2 View  Result Date: 05/21/2017 CLINICAL DATA:  Atrial fibrillation EXAM: CHEST  2 VIEW COMPARISON:  March 27, 2017 FINDINGS: There is scarring in the left base region. Lungs elsewhere clear. Heart size and pulmonary vascularity are normal. No adenopathy. There is aortic atherosclerosis. There is degenerative change in each shoulder. IMPRESSION: Scarring left base. No edema or consolidation. Stable cardiac silhouette. There is aortic atherosclerosis. Electronically Signed   By: Bretta Bang III M.D.   On: 05/21/2017 07:54    Procedures Procedures (including critical care time)  Medications Ordered in ED Medications - No data to display   Initial Impression / Assessment and Plan / ED Course  I have reviewed the triage vital signs and the nursing notes.  Pertinent labs & imaging results that were available during my care of the patient were reviewed by me and considered in my medical decision making (see chart  for details).      Final Clinical Impressions(s) / ED Diagnoses   Final diagnoses:  PAF (paroxysmal atrial fibrillation) (HCC)  Patient has known paroxysmal atrial fibrillation. She was awakened and symptomatic and around 4 AM. She took flecainide approximately 2 hours later and had resolution of symptoms within 1 hour. Patient is now asymptomatic. Patient is noncompliant with her Xarelto due to her personal feelings on the risks and benefits of the medication. Patient is counseled that her cardiologist indicates in most recent note that she is to be taking the Xarelto. She is strongly encouraged to readdress this this week.  New Prescriptions New Prescriptions   No medications on file  Arby Barrette, MD 05/21/17 1008

## 2017-05-21 NOTE — ED Triage Notes (Signed)
Pt arrives from home via [facility] reporting awaking from sleep by fluttering in chest, states, "My heart felt like a bird."  EMS reports pt initially in afib, HR 70-150.  Pt spontaneously converted, on arrival HR 63.  Pt reports not taking Xarelto.  Pt reports taking 324 ASA at home.

## 2017-05-21 NOTE — Progress Notes (Signed)
Patient ID: Amanda Olsen, female   DOB: Aug 17, 1934, 83 y.o.   MRN: [id number]   Abbi is seen today for F/U of HTN and PAF. She has maintained NSR on Flecainide. She did not have proarrythmia on ETT. She has good LV function and no CAD by cath. Her last visit we started ACE for BP contorl. She sold her apartment there and is living independanlty in an apartment in [place]. She denies SSCP, palpitations, syncope, edema or dyspnea. I reviewed lab work from 04/08/10 and her K was 3.9 with Cr .7 and normal LFTS. LDL was 103. She has a component of white coat hypertension. Has son and daughter in law with two grandchildren in [place] but misses [place] where she is from. Does not drive Seen in ER 78/29 for palpitations but in NSR. Has white coat syndrome and home BP is in the 120 systolic range  Labs reviewed from [facility] and normla LDL was 99   BP seems best controlled when she take diuretic with ACE Lisinopril dose increased last visit   She had vomiting and diarrhea on 4/17 and seen in ER with rapid afib. Converted with metroprolol  She has been hesitant to take xarelto due to "low PLT;s" I explained to her 154 is plenty high to not be risk For bleeding  ROS: Denies fever, malais, weight loss, blurry vision, decreased visual acuity, cough, sputum, SOB, hemoptysis, pleuritic pain, palpitaitons, heartburn, abdominal pain, melena, lower extremity edema, claudication, or rash.  All other systems reviewed and negative  General: Affect appropriate Elderly [place] female  HEENT: normal Neck supple with no adenopathy JVP normal no bruits no thyromegaly Lungs clear with no wheezing and good diaphragmatic motion Heart:  S1/S2 no murmur, no rub, gallop or click PMI normal Abdomen: benighn, BS positve, no tenderness, no AAA no bruit.  No HSM or HJR Distal pulses intact with no bruits No edema Neuro non-focal Skin warm and dry No muscular weakness    Lab Results   Component Value Date   CREATININE 0.78 05/21/2017   BUN 9 05/21/2017   NA 137 05/21/2017   K 3.8 05/21/2017   CL 105 05/21/2017   CO2 25 05/21/2017     Current Outpatient Prescriptions  Medication Sig Dispense Refill  . calcium carbonate (OS-CAL) 600 MG TABS Take 600 mg by mouth daily.      . Cholecalciferol (VITAMIN D) 2000 UNITS tablet Take 2,000 Units by mouth daily.     . cyanocobalamin 2000 MCG tablet Take 2,050 mcg by mouth daily.     . flecainide (TAMBOCOR) 100 MG tablet TAKE 1 TABLET (100 MG TOTAL) BY MOUTH 2 (TWO) TIMES DAILY. 180 tablet 2  . hydrochlorothiazide (MICROZIDE) 12.5 MG capsule TAKE ONE CAPSULE BY MOUTH DAILY 90 capsule 2  . lisinopril (PRINIVIL,ZESTRIL) 20 MG tablet Take 1 tablet (20 mg total) by mouth daily. 90 tablet 3  . Rivaroxaban (XARELTO) 15 MG TABS tablet Take 1 tablet (15 mg total) by mouth daily with supper. 90 tablet 3   No current facility-administered medications for this visit.     Allergies  Patient has no known allergies.  Electrocardiogram: 12/29  SR rate 60 PR 216 PAC today 12/24/14  SR rate 64 PR 210 LBBB no sig change 01/07/16  SR rate 63  LBBB  10/23/16 SR rate 58 PR 232 LBBB   Assessment and Plan  HTN:  Improved continue current meds  LBBB:  Stable no high grade AV block yearly ECG  PAF  Episode recently ppt by GI illness CHAD2VASC 3 continue low dose xarelto Follow PLTls With history of mild chronic thrmombocytopendia.   Ortho:  L45 disc issues on MRI going to PT  F/U Dr Shon Baton for steroid injection if not better will Need to hold xarelto for 2 days before injection   F/U with me in 6 months  Charlton Haws

## 2017-05-21 NOTE — Discharge Instructions (Signed)
See your cardiologist to again review the risks versus benefits of taking Xarelto as prescribed.   My review of your last cardiologist visit indicates that your cardiologist intends for you to be taking Xarelto for your paroxysmal atrial fibrillation.

## 2017-05-23 ENCOUNTER — Ambulatory Visit (INDEPENDENT_AMBULATORY_CARE_PROVIDER_SITE_OTHER): Payer: Medicare HMO | Admitting: Cardiovascular Disease

## 2017-05-23 ENCOUNTER — Encounter: Payer: Self-pay | Admitting: Cardiovascular Disease

## 2017-05-23 VITALS — BP 140/76 | HR 66 | Ht 66.0 in | Wt 143.2 lb

## 2017-05-23 DIAGNOSIS — I4891 Unspecified atrial fibrillation: Secondary | ICD-10-CM | POA: Diagnosis not present

## 2017-05-23 NOTE — Patient Instructions (Signed)

## 2017-05-25 DIAGNOSIS — M545 Low back pain: Secondary | ICD-10-CM | POA: Diagnosis not present

## 2017-06-01 DIAGNOSIS — M545 Low back pain: Secondary | ICD-10-CM | POA: Diagnosis not present

## 2017-06-04 ENCOUNTER — Telehealth: Payer: Self-pay | Admitting: Cardiovascular Disease

## 2017-06-04 NOTE — Telephone Encounter (Signed)
Patient complained of having more frequent episodes of A. FIB. Patient would like to see someone. Made an appointment with Rudi Coco NP for tomorrow.

## 2017-06-04 NOTE — Telephone Encounter (Signed)
New Message:    Please call Amanda Olsen,she have some questions and some concerns please.She is having her Atrial Fib episodes more frequent.

## 2017-06-05 ENCOUNTER — Ambulatory Visit (HOSPITAL_COMMUNITY)
Admission: RE | Admit: 2017-06-05 | Discharge: 2017-06-05 | Disposition: A | Discharge status: Home / Self Care | Payer: Medicare HMO | Source: Ambulatory Visit | Attending: Nurse Practitioner | Admitting: Nurse Practitioner

## 2017-06-05 ENCOUNTER — Encounter (HOSPITAL_COMMUNITY): Payer: Self-pay | Admitting: Nurse Practitioner

## 2017-06-05 VITALS — BP 170/90 | HR 58 | Ht 66.0 in | Wt 139.0 lb

## 2017-06-05 DIAGNOSIS — I447 Left bundle-branch block, unspecified: Secondary | ICD-10-CM | POA: Diagnosis not present

## 2017-06-05 DIAGNOSIS — I10 Essential (primary) hypertension: Secondary | ICD-10-CM | POA: Diagnosis not present

## 2017-06-05 DIAGNOSIS — L719 Rosacea, unspecified: Secondary | ICD-10-CM | POA: Insufficient documentation

## 2017-06-05 DIAGNOSIS — I480 Paroxysmal atrial fibrillation: Secondary | ICD-10-CM | POA: Diagnosis not present

## 2017-06-05 DIAGNOSIS — I491 Atrial premature depolarization: Secondary | ICD-10-CM | POA: Insufficient documentation

## 2017-06-05 DIAGNOSIS — Z7901 Long term (current) use of anticoagulants: Secondary | ICD-10-CM | POA: Diagnosis not present

## 2017-06-05 DIAGNOSIS — R001 Bradycardia, unspecified: Secondary | ICD-10-CM | POA: Diagnosis not present

## 2017-06-05 MED ORDER — APIXABAN 5 MG PO TABS: 5.0000 mg | ORAL_TABLET | Freq: Two times a day (BID) | ORAL | 0 refills | Status: DC

## 2017-06-05 MED ORDER — DILTIAZEM HCL 30 MG PO TABS: ORAL_TABLET | ORAL | 1 refills | Status: DC

## 2017-06-05 NOTE — Patient Instructions (Signed)
Your physician has recommended you make the following change in your medication:  1)Stop Xarelto 2)Start Eliquis 5mg  twice a day 3)Cardizem 30mg  -- take 1 tablet every 4 hours AS NEEDED for breakthrough afib if heart rate over 100 as long as top number of blood pressure >100.

## 2017-06-06 NOTE — Progress Notes (Signed)
Primary Care Physician: Thayer Headings, MD Referring Physician: Dr. Darliss Ridgel is a 83 y.o. female with a h/o paroxysmal afib in the clinic for evaluation. She called and was c/o of increase afib burden and was referred here. She is [place] and speaks decent [place] but her daughter is helping with translation as well. Apparently, she has been on sotalol since 2009. She had been deferring anticoagulation because of fear re bleeding and she has low normal plts, but did agree 2 weeks ago to go on daily anticoagulation with 15 mg xarelto. She is anxious re bleeding. She has a chadsvasc score of at least 4. She reports that she has had 3 episodes of afib over the last month that  last less than an hour. He daughter states that she is has had a lot of pain with sciatica over the last couple of months and she feels the pain as well as the stress from the pain is triggering afib. Her BP is up today but she states that is usually well managed at home and she is anxious. She had N/V and was treated in the ER, presented as well with afib with rvr.  Today, she denies symptoms of palpitations, chest pain, shortness of breath, orthopnea, PND, lower extremity edema, dizziness, presyncope, syncope, or neurologic sequela. The patient is tolerating medications without difficulties and is otherwise without complaint today.   Past Medical History:  Diagnosis Date  . Atrial fibrillation (HCC)    maintained in sinus on Flecainide  . Cataract   . ELECTROCARDIOGRAM, ABNORMAL   . HTN (hypertension)   . Rosacea    Past Surgical History:  Procedure Laterality Date  . VARICOSE VEIN SURGERY      Current Outpatient Prescriptions  Medication Sig Dispense Refill  . calcium carbonate (OS-CAL) 600 MG TABS Take 600 mg by mouth daily.      . Cholecalciferol (VITAMIN D) 2000 UNITS tablet Take 2,000 Units by mouth daily.     . cyanocobalamin 2000 MCG tablet Take 2,050 mcg by mouth daily.     . flecainide  (TAMBOCOR) 100 MG tablet TAKE 1 TABLET (100 MG TOTAL) BY MOUTH 2 (TWO) TIMES DAILY. 180 tablet 2  . hydrochlorothiazide (MICROZIDE) 12.5 MG capsule TAKE ONE CAPSULE BY MOUTH DAILY 90 capsule 2  . lisinopril (PRINIVIL,ZESTRIL) 20 MG tablet Take 1 tablet (20 mg total) by mouth daily. 90 tablet 3  . apixaban (ELIQUIS) 5 MG TABS tablet Take 1 tablet (5 mg total) by mouth 2 (two) times daily. 60 tablet 0  . diltiazem (CARDIZEM) 30 MG tablet Take 1 tablet every 4 hours AS NEEDED for breakthrough afib heart rate >100 45 tablet 1   No current facility-administered medications for this encounter.     No Known Allergies  Social History   Social History  . Marital status: Widowed    Spouse name: N/A  . Number of children: N/A  . Years of education: N/A   Occupational History  . Not on file.   Social History Main Topics  . Smoking status: Never Smoker  . Smokeless tobacco: Never Used  . Alcohol use No  . Drug use: No  . Sexual activity: Not Currently   Other Topics Concern  . Not on file   Social History Narrative  . No narrative on file    Family History  Problem Relation Age of Onset  . Heart Problems Mother     ROS- All systems are reviewed and negative except as  per the HPI above  Physical Exam: Vitals:   06/05/17 0930  BP: (!) 170/90  Pulse: (!) 58  Weight: 139 lb (63 kg)  Height: 5\' 6"  (1.676 m)   Wt Readings from Last 3 Encounters:  06/05/17 139 lb (63 kg)  05/23/17 143 lb 3.2 oz (65 kg)  05/21/17 150 lb (68 kg)    Labs: Lab Results  Component Value Date   NA 137 05/21/2017   K 3.8 05/21/2017   CL 105 05/21/2017   CO2 25 05/21/2017   GLUCOSE 109 (H) 05/21/2017   BUN 9 05/21/2017   CREATININE 0.78 05/21/2017   CALCIUM 8.8 (L) 05/21/2017   MG 1.9 03/27/2017   Lab Results  Component Value Date   INR 4.4 (H) 05/03/2009   Lab Results  Component Value Date   CHOL  07/02/2008    130        ATP III CLASSIFICATION:  <200     mg/dL   Desirable  161-096   mg/dL   Borderline High  >=045    mg/dL   High   HDL 42 40/98/1191   LDLCALC  07/02/2008    71        Total Cholesterol/HDL:CHD Risk Coronary Heart Disease Risk Table                     Men   Women  1/2 Average Risk   3.4   3.3   TRIG 87 07/02/2008     GEN- The patient is well appearing, alert and oriented x 3 today.   Head- normocephalic, atraumatic Eyes-  Sclera clear, conjunctiva pink Ears- hearing intact Oropharynx- clear Neck- supple, no JVP Lymph- no cervical lymphadenopathy Lungs- Clear to ausculation bilaterally, normal work of breathing Heart- Regular rate and rhythm, no murmurs, rubs or gallops, PMI not laterally displaced GI- soft, NT, ND, + BS Extremities- no clubbing, cyanosis, or edema MS- no significant deformity or atrophy Skin- no rash or lesion Psych- euthymic mood, full affect Neuro- strength and sensation are intact  EKG- SR with first degree AV block and LBBB(old) EPic records reviewed    Assessment and Plan: 1. Paroxysmal afib General education re afib Still fairly low afib burden with last month 3 episodes occurring less than 1 hour each. We discussed other option to control afib. She is borderline for age for  ablation, but may still be a candidate. Amiodarone, tiksoyn discussed. Kidney function is not good enough for sotalol and she would only qualify for 250 mcg of tikosyn bid. She is leary of side effect profile of amiodarone. I told ehr even with change of antiarrythmic, she still may have occasional breakthrough. We also discussed her fear of anticoagulation and her real risk of stroke. She would like to try Eliquis, for its bleeding safety profile. Will Rx eliquis 5 mg bid. Bleeding precautions discussed. She will also be given 30 mg cardizem to use as needed for breakthrough Continue flecainide   Will f/u with pt in 3-4 weeks to further address afib burden and use of eliquis. CBC at that time as CBC 2 weeks ago showed a mild drop in  H/H.  Elvina Sidle [facility] [address] [place], [place] [address] [phone]  .

## 2017-06-08 DIAGNOSIS — M545 Low back pain: Secondary | ICD-10-CM | POA: Diagnosis not present

## 2017-06-14 DIAGNOSIS — M545 Low back pain: Secondary | ICD-10-CM | POA: Diagnosis not present

## 2017-06-22 DIAGNOSIS — M545 Low back pain: Secondary | ICD-10-CM | POA: Diagnosis not present

## 2017-06-25 ENCOUNTER — Ambulatory Visit (HOSPITAL_COMMUNITY)
Admission: RE | Admit: 2017-06-25 | Discharge: 2017-06-25 | Disposition: A | Discharge status: Home / Self Care | Payer: Medicare HMO | Source: Ambulatory Visit | Attending: Nurse Practitioner | Admitting: Nurse Practitioner

## 2017-06-25 ENCOUNTER — Encounter (HOSPITAL_COMMUNITY): Payer: Self-pay | Admitting: Nurse Practitioner

## 2017-06-25 VITALS — BP 108/72 | HR 69 | Ht 66.0 in | Wt 135.2 lb

## 2017-06-25 DIAGNOSIS — R9431 Abnormal electrocardiogram [ECG] [EKG]: Secondary | ICD-10-CM | POA: Diagnosis not present

## 2017-06-25 DIAGNOSIS — Z7901 Long term (current) use of anticoagulants: Secondary | ICD-10-CM | POA: Insufficient documentation

## 2017-06-25 DIAGNOSIS — Z8249 Family history of ischemic heart disease and other diseases of the circulatory system: Secondary | ICD-10-CM | POA: Insufficient documentation

## 2017-06-25 DIAGNOSIS — I10 Essential (primary) hypertension: Secondary | ICD-10-CM | POA: Diagnosis not present

## 2017-06-25 DIAGNOSIS — I480 Paroxysmal atrial fibrillation: Secondary | ICD-10-CM | POA: Insufficient documentation

## 2017-06-25 DIAGNOSIS — Z79899 Other long term (current) drug therapy: Secondary | ICD-10-CM | POA: Insufficient documentation

## 2017-06-25 LAB — TSH: TSH: 4.333 u[IU]/mL (ref 0.350–4.500)

## 2017-06-25 NOTE — Progress Notes (Signed)
Primary Care Physician: Thayer Headings, MD Referring Physician: Dr. Darliss Ridgel is a 83 y.o. female with a h/o paroxysmal afib in the clinic for evaluation. She called and was c/o of increase afib burden and was referred here. She is [place] and speaks decent [place] but her daughter is helping with translation as well. Apparently, she has been on sotalol since 2009. She had been deferring anticoagulation because of fear re bleeding and she has low normal plts, but did agree 2 weeks ago to go on daily anticoagulation with 15 mg xarelto. She is anxious re bleeding. She has a chadsvasc score of at least 4. She reports that she has had 3 episodes of afib over the last month that  last less than an hour. He daughter states that she is has had a lot of pain with sciatica over the last couple of months and she feels the pain as well as the stress from the pain is triggering afib. Her BP is up today but she states that is usually well managed at home and she is anxious. She had N/V and was treated in the ER, presented as well with afib with rvr.  F/u in afib clinic as pt called earlier this am and stated that she was in  afib all weekend. This was a long epiosde for her. She currently has returned to SR by EKG. She is anxious and mildly diaphoretic in the office with BP initially around 90 sys but after water and recheck it is 116 sys.She states that she feels weak and tired today. No extra Caredizem today but did take lisinopril this am.   Today, she denies symptoms of palpitations, chest pain, shortness of breath, orthopnea, PND, lower extremity edema, dizziness, presyncope, syncope, or neurologic sequela. The patient is tolerating medications without difficulties and is otherwise without complaint today.   Past Medical History:  Diagnosis Date  . Atrial fibrillation (HCC)    maintained in sinus on Flecainide  . Cataract   . ELECTROCARDIOGRAM, ABNORMAL   . HTN (hypertension)   .  Rosacea    Past Surgical History:  Procedure Laterality Date  . VARICOSE VEIN SURGERY      Current Outpatient Prescriptions  Medication Sig Dispense Refill  . apixaban (ELIQUIS) 5 MG TABS tablet Take 1 tablet (5 mg total) by mouth 2 (two) times daily. 60 tablet 0  . calcium carbonate (OS-CAL) 600 MG TABS Take 600 mg by mouth daily.      . Cholecalciferol (VITAMIN D) 2000 UNITS tablet Take 2,000 Units by mouth daily.     . cyanocobalamin 2000 MCG tablet Take 2,050 mcg by mouth daily.     Marland Kitchen diltiazem (CARDIZEM) 30 MG tablet Take 1 tablet every 4 hours AS NEEDED for breakthrough afib heart rate >100 45 tablet 1  . flecainide (TAMBOCOR) 100 MG tablet TAKE 1 TABLET (100 MG TOTAL) BY MOUTH 2 (TWO) TIMES DAILY. 180 tablet 2  . hydrochlorothiazide (MICROZIDE) 12.5 MG capsule TAKE ONE CAPSULE BY MOUTH DAILY 90 capsule 2  . lisinopril (PRINIVIL,ZESTRIL) 20 MG tablet Take 1 tablet (20 mg total) by mouth daily. 90 tablet 3   No current facility-administered medications for this encounter.     No Known Allergies  Social History   Social History  . Marital status: Widowed    Spouse name: N/A  . Number of children: N/A  . Years of education: N/A   Occupational History  . Not on file.   Social History Main  Topics  . Smoking status: Never Smoker  . Smokeless tobacco: Never Used  . Alcohol use No  . Drug use: No  . Sexual activity: Not Currently   Other Topics Concern  . Not on file   Social History Narrative  . No narrative on file    Family History  Problem Relation Age of Onset  . Heart Problems Mother     ROS- All systems are reviewed and negative except as per the HPI above  Physical Exam: Vitals:   06/25/17 1419  BP: 108/72  Pulse: 69  Weight: 135 lb 3.2 oz (61.3 kg)  Height: 5\' 6"  (1.676 m)   Wt Readings from Last 3 Encounters:  06/25/17 135 lb 3.2 oz (61.3 kg)  06/05/17 139 lb (63 kg)  05/23/17 143 lb 3.2 oz (65 kg)    Labs: Lab Results  Component Value  Date   NA 137 05/21/2017   K 3.8 05/21/2017   CL 105 05/21/2017   CO2 25 05/21/2017   GLUCOSE 109 (H) 05/21/2017   BUN 9 05/21/2017   CREATININE 0.78 05/21/2017   CALCIUM 8.8 (L) 05/21/2017   MG 1.9 03/27/2017   Lab Results  Component Value Date   INR 4.4 (H) 05/03/2009   Lab Results  Component Value Date   CHOL  07/02/2008    130        ATP III CLASSIFICATION:  <200     mg/dL   Desirable  160-737  mg/dL   Borderline High  >=106    mg/dL   High   HDL 42 26/94/8546   LDLCALC  07/02/2008    71        Total Cholesterol/HDL:CHD Risk Coronary Heart Disease Risk Table                     Men   Women  1/2 Average Risk   3.4   3.3   TRIG 87 07/02/2008     GEN- The patient is well appearing, alert and oriented x 3 today.   Head- normocephalic, atraumatic Eyes-  Sclera clear, conjunctiva pink Ears- hearing intact Oropharynx- clear Neck- supple, no JVP Lymph- no cervical lymphadenopathy Lungs- Clear to ausculation bilaterally, normal work of breathing Heart- Regular rate and rhythm, no murmurs, rubs or gallops, PMI not laterally displaced GI- soft, NT, ND, + BS Extremities- no clubbing, cyanosis, or edema MS- no significant deformity or atrophy Skin- no rash or lesion Psych- euthymic mood, full affect Neuro- strength and sensation are intact  EKG- SR with first degree AV block and LBBB(old) EPic records reviewed    Assessment and Plan: 1. Paroxysmal afib Recent increased afib burden Discussed options with pt again, her daughter is not with her as she is on a business trip and there is a mild language barrier. Discussed options of hospitalization with tikosyn(dofetilide) or sotalol If hospitalization is not wanted, then amiodarone but discussed side effect profile I told her even with change of antiarrythmic, she still may have occasional breakthrough. She will need updated echo and to check on the price of tiksoyn  For now continue flecainide but hopefully after  echo results next week, daughter returns home to further discuss, cost of drug is known, then plan can be put into place. Tsh drawn today   Elvina Sidle. [facility] [address] [place], [place] [address] [phone]  .

## 2017-06-25 NOTE — Progress Notes (Signed)
You have been scheduled for an echocardiogram.  This is an ultrasound of the heart. This is scheduled for 07/02/17 at 7:45 am.    You will come to the same parking area

## 2017-06-27 ENCOUNTER — Ambulatory Visit (HOSPITAL_COMMUNITY): Payer: Medicare HMO | Admitting: Nurse Practitioner

## 2017-07-02 ENCOUNTER — Ambulatory Visit (HOSPITAL_COMMUNITY)
Admission: RE | Admit: 2017-07-02 | Discharge: 2017-07-02 | Disposition: A | Discharge status: Home / Self Care | Payer: Medicare HMO | Source: Ambulatory Visit | Attending: Nurse Practitioner | Admitting: Nurse Practitioner

## 2017-07-02 ENCOUNTER — Other Ambulatory Visit (HOSPITAL_COMMUNITY): Payer: Self-pay | Admitting: *Deleted

## 2017-07-02 DIAGNOSIS — I447 Left bundle-branch block, unspecified: Secondary | ICD-10-CM | POA: Insufficient documentation

## 2017-07-02 DIAGNOSIS — I5030 Unspecified diastolic (congestive) heart failure: Secondary | ICD-10-CM | POA: Insufficient documentation

## 2017-07-02 DIAGNOSIS — R9431 Abnormal electrocardiogram [ECG] [EKG]: Secondary | ICD-10-CM

## 2017-07-02 DIAGNOSIS — I480 Paroxysmal atrial fibrillation: Secondary | ICD-10-CM | POA: Diagnosis not present

## 2017-07-02 DIAGNOSIS — I420 Dilated cardiomyopathy: Secondary | ICD-10-CM | POA: Diagnosis not present

## 2017-07-02 MED ORDER — APIXABAN 5 MG PO TABS: 5.0000 mg | ORAL_TABLET | Freq: Two times a day (BID) | ORAL | 6 refills | Status: DC

## 2017-07-02 NOTE — Progress Notes (Signed)
  2D Echocardiogram has been performed.  Arvil Chaco 07/02/2017, 9:20 AM

## 2017-07-04 ENCOUNTER — Other Ambulatory Visit (HOSPITAL_COMMUNITY): Payer: Self-pay | Admitting: *Deleted

## 2017-07-04 ENCOUNTER — Telehealth: Payer: Self-pay | Admitting: Pharmacist

## 2017-07-04 NOTE — Telephone Encounter (Signed)
Medication list reviewed in anticipation of upcoming Tikosyn initiation. Patient is taking HCTZ which is contraindicated with Tikosyn and will need to be discontinued 3 days prior to Tikosyn start. Will also need 3 day washout of flecainide prior to switching.   Patient is anticoagulated on Eliquis 5mg  BID on the appropriate dose. K was low < 4 on last BMET check ~1 month ago and will likely need to be repleted.  Please ensure that patient has not missed any anticoagulation doses in the 3 weeks prior to Tikosyn initiation. Patient will need to be counseled to avoid use of Benadryl while on Tikosyn and in the 2-3 days prior to Tikosyn initiation.

## 2017-07-05 ENCOUNTER — Ambulatory Visit (HOSPITAL_COMMUNITY)
Admission: RE | Admit: 2017-07-05 | Discharge: 2017-07-05 | Disposition: A | Discharge status: Home / Self Care | Payer: Medicare HMO | Source: Ambulatory Visit | Attending: Nurse Practitioner | Admitting: Nurse Practitioner

## 2017-07-05 ENCOUNTER — Encounter (HOSPITAL_COMMUNITY): Payer: Self-pay | Admitting: Nurse Practitioner

## 2017-07-05 VITALS — BP 142/90 | HR 64 | Ht 66.0 in | Wt 136.2 lb

## 2017-07-05 DIAGNOSIS — R61 Generalized hyperhidrosis: Secondary | ICD-10-CM | POA: Diagnosis not present

## 2017-07-05 DIAGNOSIS — I480 Paroxysmal atrial fibrillation: Secondary | ICD-10-CM | POA: Diagnosis not present

## 2017-07-05 DIAGNOSIS — Z79899 Other long term (current) drug therapy: Secondary | ICD-10-CM | POA: Diagnosis not present

## 2017-07-05 DIAGNOSIS — I10 Essential (primary) hypertension: Secondary | ICD-10-CM | POA: Diagnosis not present

## 2017-07-05 DIAGNOSIS — Z9889 Other specified postprocedural states: Secondary | ICD-10-CM | POA: Insufficient documentation

## 2017-07-05 DIAGNOSIS — L719 Rosacea, unspecified: Secondary | ICD-10-CM | POA: Diagnosis not present

## 2017-07-05 DIAGNOSIS — Z7901 Long term (current) use of anticoagulants: Secondary | ICD-10-CM | POA: Diagnosis not present

## 2017-07-05 LAB — BASIC METABOLIC PANEL
Anion gap: 9 (ref 5–15)
BUN: 14 mg/dL (ref 6–20)
CO2: 24 mmol/L (ref 22–32)
Calcium: 9.3 mg/dL (ref 8.9–10.3)
Chloride: 105 mmol/L (ref 101–111)
Creatinine, Ser: 0.81 mg/dL (ref 0.44–1.00)
GFR calc Af Amer: >60 mL/min (ref >60)
GFR calc non Af Amer: >60 mL/min (ref >60)
Glucose, Bld: 108 mg/dL — ABNORMAL HIGH (ref 65–99)
Potassium: 3.9 mmol/L (ref 3.5–5.1)
Sodium: 138 mmol/L (ref 135–145)

## 2017-07-05 LAB — MAGNESIUM: Magnesium: 2.1 mg/dL (ref 1.7–2.4)

## 2017-07-05 NOTE — Progress Notes (Signed)
Primary Care Physician: Thayer Headings, MD Referring Physician: Dr. Darliss Ridgel is a 83 y.o. female with a h/o paroxysmal afib in the clinic for evaluation. She called and was c/o of increase afib burden and was referred here. She is [place] and speaks decent [place] but her daughter- in - law is helping with translation as well. Apparently, she has been on  flecainide since 2009. She had been deferring anticoagulation because of fear re bleeding and she has low normal plts, but did agree 2 weeks ago to go on daily anticoagulation with 15 mg xarelto. She is anxious re bleeding. She has a chadsvasc score of at least 4. She reports that she has had 3 episodes of afib over the last month that  last less than an hour. He daughter- [place] states that she is has had a lot of pain with sciatica over the last couple of months and she feels the pain as well as the stress from the pain is triggering afib. Her BP is up today but she states that is usually well managed at home and she is anxious. She had N/V and was treated in the ER, presented as well with afib with rvr.  F/u in afib clinic as pt called earlier this am and stated that she was in  afib all weekend. This was a long epiosde for her. She currently has returned to SR by EKG. She is anxious and mildly diaphoretic in the office with BP initially around 90 sys but after water and recheck it is 116 sys.She states that she feels weak and tired today. No extra Caredizem today but did take lisinopril this am. Echo ordered and talked to pt again re changing antiarrythmic's.  Return f/u echo, 7/27. Echo showed EF  Now reduced to 40-45%, since last echo. It was discussed, she should come off flecainde for LV dysfunction. She did check on price of dofetilide and it will be very reasonable for her. Long discussion re dofetilide and coming into hospital for same. Pt's English is broken but appears to do well without interpretor.  She has a son and  daughter in law that live in [place] and her husband is deceased so she is here without family support. Qtc appears long but she has an old LBBB contributing. Explained to her that flecainide and hctz will have to be stopped as of tomorrow to plan on tikosyn for Monday. She started eliquis in June and has had one refill, states has taken consistently without any missed doses. States no missed doses of DOAC. Does not use Benadryl. Crcl cal on last bmet, 6/11, 55.09 so she may only qualify for 250 mcg bid day.states that her afib over the last week has been quiet.  Today, she denies symptoms of palpitations, chest pain, shortness of breath, orthopnea, PND, lower extremity edema, dizziness, presyncope, syncope, or neurologic sequela. The patient is tolerating medications without difficulties and is otherwise without complaint today.   Past Medical History:  Diagnosis Date  . Atrial fibrillation (HCC)    maintained in sinus on Flecainide  . Cataract   . ELECTROCARDIOGRAM, ABNORMAL   . HTN (hypertension)   . Rosacea    Past Surgical History:  Procedure Laterality Date  . VARICOSE VEIN SURGERY      Current Outpatient Prescriptions  Medication Sig Dispense Refill  . apixaban (ELIQUIS) 5 MG TABS tablet Take 1 tablet (5 mg total) by mouth 2 (two) times daily. 60 tablet 6  . calcium  carbonate (OS-CAL) 600 MG TABS Take 600 mg by mouth daily.      . Cholecalciferol (VITAMIN D) 2000 UNITS tablet Take 2,000 Units by mouth daily.     . cyanocobalamin 2000 MCG tablet Take 2,050 mcg by mouth daily.     Marland Kitchen lisinopril (PRINIVIL,ZESTRIL) 20 MG tablet Take 1 tablet (20 mg total) by mouth daily. 90 tablet 3  . diltiazem (CARDIZEM) 30 MG tablet Take 1 tablet every 4 hours AS NEEDED for breakthrough afib heart rate >100 (Patient not taking: Reported on 07/05/2017) 45 tablet 1   No current facility-administered medications for this encounter.     No Known Allergies  Social History   Social History  . Marital  status: Widowed    Spouse name: N/A  . Number of children: N/A  . Years of education: N/A   Occupational History  . Not on file.   Social History Main Topics  . Smoking status: Never Smoker  . Smokeless tobacco: Never Used  . Alcohol use No  . Drug use: No  . Sexual activity: Not Currently   Other Topics Concern  . Not on file   Social History Narrative  . No narrative on file    Family History  Problem Relation Age of Onset  . Heart Problems Mother     ROS- All systems are reviewed and negative except as per the HPI above  Physical Exam: Vitals:   07/05/17 1326  BP: (!) 142/90  Pulse: 64  SpO2: 97%  Weight: 136 lb 3.2 oz (61.8 kg)  Height: 5\' 6"  (1.676 m)   Wt Readings from Last 3 Encounters:  07/05/17 136 lb 3.2 oz (61.8 kg)  06/25/17 135 lb 3.2 oz (61.3 kg)  06/05/17 139 lb (63 kg)    Labs: Lab Results  Component Value Date   NA 137 05/21/2017   K 3.8 05/21/2017   CL 105 05/21/2017   CO2 25 05/21/2017   GLUCOSE 109 (H) 05/21/2017   BUN 9 05/21/2017   CREATININE 0.78 05/21/2017   CALCIUM 8.8 (L) 05/21/2017   MG 1.9 03/27/2017   Lab Results  Component Value Date   INR 4.4 (H) 05/03/2009   Lab Results  Component Value Date   CHOL  07/02/2008    130        ATP III CLASSIFICATION:  <200     mg/dL   Desirable  333-832  mg/dL   Borderline High  >=919    mg/dL   High   HDL 42 16/60/6004   LDLCALC  07/02/2008    71        Total Cholesterol/HDL:CHD Risk Coronary Heart Disease Risk Table                     Men   Women  1/2 Average Risk   3.4   3.3   TRIG 87 07/02/2008     GEN- The patient is well appearing, alert and oriented x 3 today.   Head- normocephalic, atraumatic Eyes-  Sclera clear, conjunctiva pink Ears- hearing intact Oropharynx- clear Neck- supple, no JVP Lymph- no cervical lymphadenopathy Lungs- Clear to ausculation bilaterally, normal work of breathing Heart- Regular rate and rhythm, no murmurs, rubs or gallops, PMI not  laterally displaced GI- soft, NT, ND, + BS Extremities- no clubbing, cyanosis, or edema MS- no significant deformity or atrophy Skin- no rash or lesion Psych- euthymic mood, full affect Neuro- strength and sensation are intact  EKG- Ekg deferred by pt today  EPic records reviewed Echo-Study Conclusions  - Left ventricle: Systolic function was mildly to moderately   reduced. The estimated ejection fraction was in the range of 40%   to 45%. Features are consistent with a pseudonormal left   ventricular filling pattern, with concomitant abnormal relaxation   and increased filling pressure (grade 2 diastolic dysfunction). - Ventricular septum: Septal motion showed paradox. These changes   are consistent with a left bundle branch block. - Left atrium: The atrium was mildly dilated. - Pulmonary arteries: Systolic pressure was mildly increased. PA   peak pressure: 39 mm Hg (S).   Assessment and Plan: 1. Paroxysmal afib Recent increased afib burden Echo shows LV dysfunction Would be best to change antiarrythmic with increase in afib burden and LV dysfunction. Discussed options with pt again,  there is a mild language barrier. Pt would like to proceed with  hospitalization with tikosyn(dofetilide, will be affordable for her) She will stop flecainide and hctz  tomorrow, this will allow for 3 day washout before hospitalization on Monday She will use 30 mg diltiazem over the week end if afib should occur Reminded no benadryl, she does not use Contiues on eliquis, no missed doses, chadsvasc score of at least 5. bmet/mag today  Return on Monday for planned admission for [facility] [address] [place], [place] [address] [phone]  .

## 2017-07-05 NOTE — Patient Instructions (Addendum)
Your physician has recommended you make the following change in your medication:  1) Stop flecainide  2) Stop HCTZ    If AFib over the weekend can use cardizem 30mg  tablets every 4 hours AS NEEDED if your heart rate is above 100. --- prescription is at your pharmacy.

## 2017-07-06 DIAGNOSIS — M545 Low back pain: Secondary | ICD-10-CM | POA: Diagnosis not present

## 2017-07-09 ENCOUNTER — Ambulatory Visit (HOSPITAL_COMMUNITY)
Admission: RE | Admit: 2017-07-09 | Discharge: 2017-07-09 | Disposition: A | Discharge status: Home / Self Care | Payer: Medicare HMO | Source: Ambulatory Visit | Attending: Nurse Practitioner | Admitting: Nurse Practitioner

## 2017-07-09 ENCOUNTER — Encounter (HOSPITAL_COMMUNITY): Payer: Self-pay | Admitting: Nurse Practitioner

## 2017-07-09 ENCOUNTER — Inpatient Hospital Stay (HOSPITAL_COMMUNITY)
Admission: AD | Admit: 2017-07-09 | Discharge: 2017-07-12 | DRG: 310 | Disposition: A | Discharge status: Home / Self Care | Payer: Medicare HMO | Source: Ambulatory Visit | Attending: Internal Medicine | Admitting: Internal Medicine

## 2017-07-09 VITALS — BP 126/74 | HR 72 | Ht 66.0 in | Wt 136.2 lb

## 2017-07-09 DIAGNOSIS — I428 Other cardiomyopathies: Secondary | ICD-10-CM

## 2017-07-09 DIAGNOSIS — I472 Ventricular tachycardia: Secondary | ICD-10-CM | POA: Diagnosis not present

## 2017-07-09 DIAGNOSIS — I429 Cardiomyopathy, unspecified: Secondary | ICD-10-CM | POA: Diagnosis not present

## 2017-07-09 DIAGNOSIS — I480 Paroxysmal atrial fibrillation: Secondary | ICD-10-CM

## 2017-07-09 DIAGNOSIS — I447 Left bundle-branch block, unspecified: Secondary | ICD-10-CM

## 2017-07-09 DIAGNOSIS — M5430 Sciatica, unspecified side: Secondary | ICD-10-CM | POA: Diagnosis present

## 2017-07-09 DIAGNOSIS — Z5181 Encounter for therapeutic drug level monitoring: Secondary | ICD-10-CM

## 2017-07-09 DIAGNOSIS — Z79899 Other long term (current) drug therapy: Secondary | ICD-10-CM | POA: Diagnosis not present

## 2017-07-09 DIAGNOSIS — Z7901 Long term (current) use of anticoagulants: Secondary | ICD-10-CM | POA: Insufficient documentation

## 2017-07-09 DIAGNOSIS — I10 Essential (primary) hypertension: Secondary | ICD-10-CM | POA: Diagnosis present

## 2017-07-09 DIAGNOSIS — Z8249 Family history of ischemic heart disease and other diseases of the circulatory system: Secondary | ICD-10-CM

## 2017-07-09 DIAGNOSIS — L719 Rosacea, unspecified: Secondary | ICD-10-CM | POA: Insufficient documentation

## 2017-07-09 DIAGNOSIS — Z9889 Other specified postprocedural states: Secondary | ICD-10-CM

## 2017-07-09 LAB — BASIC METABOLIC PANEL
Anion gap: 7 (ref 5–15)
BUN: 14 mg/dL (ref 6–20)
CO2: 27 mmol/L (ref 22–32)
Calcium: 9.3 mg/dL (ref 8.9–10.3)
Chloride: 105 mmol/L (ref 101–111)
Creatinine, Ser: 0.78 mg/dL (ref 0.44–1.00)
GFR calc Af Amer: >60 mL/min (ref >60)
GFR calc non Af Amer: >60 mL/min (ref >60)
Glucose, Bld: 99 mg/dL (ref 65–99)
Potassium: 4.3 mmol/L (ref 3.5–5.1)
Sodium: 139 mmol/L (ref 135–145)

## 2017-07-09 LAB — CBC
HCT: 41.6 % (ref 36.0–46.0)
Hemoglobin: 13.6 g/dL (ref 12.0–15.0)
MCH: 30.6 pg (ref 26.0–34.0)
MCHC: 32.7 g/dL (ref 30.0–36.0)
MCV: 93.5 fL (ref 78.0–100.0)
Platelets: 189 10*3/uL (ref 150–400)
RBC: 4.45 MIL/uL (ref 3.87–5.11)
RDW: 13.4 % (ref 11.5–15.5)
WBC: 4.2 10*3/uL (ref 4.0–10.5)

## 2017-07-09 LAB — MAGNESIUM: Magnesium: 2.1 mg/dL (ref 1.7–2.4)

## 2017-07-09 MED ORDER — SODIUM CHLORIDE 0.9% FLUSH
3.0000 mL | Freq: Two times a day (BID) | INTRAVENOUS | Status: DC
Start: 2017-07-09 — End: 2017-07-12
  Administered 2017-07-09 – 2017-07-12 (×4): 3 mL via INTRAVENOUS

## 2017-07-09 MED ORDER — SODIUM CHLORIDE 0.9% FLUSH: 3.0000 mL | INTRAVENOUS | Status: DC | PRN

## 2017-07-09 MED ORDER — DOFETILIDE 250 MCG PO CAPS
250.0000 ug | ORAL_CAPSULE | Freq: Two times a day (BID) | ORAL | Status: DC
  Administered 2017-07-09 – 2017-07-12 (×6): 250 ug via ORAL
  Filled 2017-07-09 (×6): qty 1

## 2017-07-09 MED ORDER — LISINOPRIL 20 MG PO TABS
20.0000 mg | ORAL_TABLET | Freq: Every day | ORAL | Status: DC
  Administered 2017-07-10 – 2017-07-12 (×3): 20 mg via ORAL
  Filled 2017-07-09 (×3): qty 1

## 2017-07-09 MED ORDER — APIXABAN 5 MG PO TABS
5.0000 mg | ORAL_TABLET | Freq: Two times a day (BID) | ORAL | Status: DC
  Administered 2017-07-09 – 2017-07-12 (×6): 5 mg via ORAL
  Filled 2017-07-09 (×6): qty 1

## 2017-07-09 MED ORDER — SODIUM CHLORIDE 0.9 % IV SOLN: 250.0000 mL | INTRAVENOUS | Status: DC | PRN

## 2017-07-09 NOTE — Progress Notes (Signed)
Pharmacy Review for Dofetilide (Tikosyn) Initiation  Admit Complaint: 83 y.o. female admitted 07/09/2017 with atrial fibrillation to be initiated on dofetilide.   Assessment:  Patient Exclusion Criteria: If any screening criteria checked as "Yes", then  patient  should NOT receive dofetilide until criteria item is corrected. If "Yes" please indicate correction plan.  YES  NO Patient  Exclusion Criteria Correction Plan  []  [x]  Baseline QTc interval is greater than or equal to 440 msec. IF above YES box checked dofetilide contraindicated unless patient has ICD; then may proceed if QTc 500-550 msec or with known ventricular conduction abnormalities may proceed with QTc 550-600 msec. QTc = 0.4 (0.433 by 12L EKG 7/30 at 10:27AM)   []  [x]  Magnesium level is less than 1.8 mEq/l : Last magnesium:  Lab Results  Component Value Date   MG 2.1 07/09/2017         []  [x]  Potassium level is less than 4 mEq/l : Last potassium:  Lab Results  Component Value Date   K 4.3 07/09/2017         []  []  Patient is known or suspected to have a digoxin level greater than 2 ng/ml: No results found for: DIGOXIN    []  [x]  Creatinine clearance less than 20 ml/min (calculated using Cockcroft-Gault, actual body weight and serum creatinine): Estimated Creatinine Clearance: 51.6 mL/min (by C-G formula based on SCr of 0.78 mg/dL).    []  [x]  Patient has received drugs known to prolong the QT intervals within the last 48 hours (phenothiazines, tricyclics or tetracyclic antidepressants, erythromycin, H-1 antihistamines, cisapride, fluoroquinolones, azithromycin). Drugs not listed above may have an, as yet, undetected potential to prolong the QT interval, updated information on QT prolonging agents is available at this website:QT prolonging agents  *Do note patient reported taking Quiet digestive chinese herbs for digestion. Reviewed product and it does NOT appear to contain licorice.   []  [x]  Patient received a dose of  hydrochlorothiazide (Oretic) alone or in any combination including triamterene (Dyazide, Maxzide) in the last 48 hours. *Off since Thursday, 7/26 (72hrs)   []  [x]  Patient received a medication known to increase dofetilide plasma concentrations prior to initial dofetilide dose:  . Trimethoprim (Primsol, Proloprim) in the last 36 hours . Verapamil (Calan, Verelan) in the last 36 hours or a sustained release dose in the last 72 hours . Megestrol (Megace) in the last 5 days  . Cimetidine (Tagamet) in the last 6 hours . Ketoconazole (Nizoral) in the last 24 hours . Itraconazole (Sporanox) in the last 48 hours  . Prochlorperazine (Compazine) in the last 36 hours    []  [x]  Patient is known to have a history of torsades de pointes; congenital or acquired long QT syndromes.   []  [x]  Patient has received a Class 1 antiarrhythmic with less than 2 half-lives since last dose. (Disopyramide, Quinidine, Procainamide, Lidocaine, Mexiletine, Flecainide, Propafenone) *Off Flecainide since Thursday, 7/26 (72hrs)   []  [x]  Patient has received amiodarone therapy in the past 3 months or amiodarone level is greater than 0.3 ng/ml.    Patient has been appropriately anticoagulated with Eliquis.  Ordering provider was confirmed at [email or web address] if they are not listed on the [facility] Authorized Prescribers list.  Goal of Therapy: Follow renal function, electrolytes, potential drug interactions, and dose adjustment. Provide education and 1 week supply at discharge.  Plan:  [x]   Physician selected initial dose within range recommended for patients level of renal function - will monitor for response.  []   Physician selected initial dose  outside of range recommended for patients level of renal function - will discuss if the dose should be altered at this time.   Select One Calculated CrCl  Dose q12h  []  > 60 ml/min 500 mcg  [x]  40-60 ml/min 250 mcg  []  20-40 ml/min 125 mcg   2. Follow up QTc after the first 5  doses, renal function, electrolytes (K & Mg) daily x 3     days, dose adjustment, success of initiation and facilitate 1 week discharge supply as     clinically indicated.  3. Initiate Tikosyn education video (Call [phone] and ask for Tikosyn Video # 116).  4. Place Enrollment Form on the chart for discharge supply of dofetilide.   Fayne Norrie 6:10 PM 07/09/2017

## 2017-07-09 NOTE — H&P (Signed)
ELECTROPHYSIOLOGY CONSULT NOTE    Patient ID: JOHNANNA Olsen MRN: [id number], DOB/AGE: Jan 11, 1934 83 y.o.  Admit date: 07/09/2017 Date of Admit: 07/09/2017   Primary Physician: Thayer Headings, MD Primary Cardiologist: Dr. Eden Emms  Reason for Admission: Tikosyn initiation  HPI: Amanda Olsen is a 83 y.o. female with PMHx of PAFib, HTN and LBBB was referred for admission for Tikosyn initiation via AFib clinic with increased episodes of palpitations and AF burden.  She had been reluctant about a/c though did get started 06/05/17.  Her HCTZ and Flecainide were stopped 07/05/17 with anticipation of Tikosyn initiation.  She was seen today in the AF clinic to f/u, confirmed she stopped the HCTZ and Flecainide and had not missed any Eliquis doses.  Office RPH reviewed medicines, recommend a needed 3 day wash out of HCTZ/Flecainide, pt denied use of benadryl. Notes report cost of drug was acceptable to the patient and planned for admission.  The patient confirms she stopped the HCTZ, last dose was Thursday, She did take 1/2 tab of her Flecainde (50mg ) Friday AM, worried she would go into AF at PT.  She denies any missed Eliquis doses, feels well without any kind of CP, SOB.   She felt like she had a couple hours of AF early this AM, no dizziness, near syncope or syncope, no recent illness.  She has chronic intermittent LLE neuropathy pain 2/2 her sciatica  The patient's 1st language is [place], she has lived here for 10 years and tells me she is comfortable with [place], declines offer for translation service.  Past Medical History:  Diagnosis Date  . Atrial fibrillation (HCC)    maintained in sinus on Flecainide  . Cataract   . ELECTROCARDIOGRAM, ABNORMAL   . HTN (hypertension)   . Rosacea      Surgical History:  Past Surgical History:  Procedure Laterality Date  . VARICOSE VEIN SURGERY       Prescriptions Prior to Admission  Medication Sig Dispense Refill Last Dose  . apixaban  (ELIQUIS) 5 MG TABS tablet Take 1 tablet (5 mg total) by mouth 2 (two) times daily. 60 tablet 6 07/09/2017 at 0700  . diltiazem (CARDIZEM) 30 MG tablet Take 1 tablet every 4 hours AS NEEDED for breakthrough afib heart rate >100 45 tablet 1 Past Month at Unknown time  . lisinopril (PRINIVIL,ZESTRIL) 20 MG tablet Take 1 tablet (20 mg total) by mouth daily. 90 tablet 3 07/09/2017 at 0630  . UNKNOWN TO PATIENT Digestive Chinese Herbs   Past Week at Unknown time    Inpatient Medications:   Allergies: No Known Allergies  Social History   Social History  . Marital status: Widowed    Spouse name: N/A  . Number of children: N/A  . Years of education: N/A   Occupational History  . Not on file.   Social History Main Topics  . Smoking status: Never Smoker  . Smokeless tobacco: Never Used  . Alcohol use No  . Drug use: No  . Sexual activity: Not Currently   Other Topics Concern  . Not on file   Social History Narrative  . No narrative on file     Family History  Problem Relation Age of Onset  . Heart Problems Mother      Review of Systems: All other systems reviewed and are otherwise negative except as noted above.  Physical Exam: Vitals:   07/09/17 1629 07/09/17 1639  BP:  (!) 145/78  Pulse:  70  Temp:  98.6 F (37 C)  TempSrc:  Oral  SpO2:  99%  Weight: 135 lb 4 oz (61.3 kg)   Height:  (1.676 m)     GEN- The patient is well appearing, alert and oriented x 3 today.   HEENT: normocephalic, atraumatic; sclera clear, conjunctiva pink; hearing intact; oropharynx clear; neck supple, no JVP Lymph- no cervical lymphadenopathy Lungs- CTA b/l  normal work of breathing.  No wheezes, rales, rhonchi Heart- RRR, extrasystoles are appreciated, no murmurs, rubs or gallops, PMI not laterally displaced GI- soft, non-tender, non-distended Extremities- no clubbing, cyanosis, or edema MS- no significant deformity or atrophy Skin- warm and dry, no rash or lesion Psych- euthymic  mood, full affect Neuro- no gross deficits observed  Labs:   Lab Results  Component Value Date   WBC 4.2 07/09/2017   HGB 13.6 07/09/2017   HCT 41.6 07/09/2017   MCV 93.5 07/09/2017   PLT 189 07/09/2017    Recent Labs Lab 07/09/17 1044  NA 139  K 4.3  CL 105  CO2 27  BUN 14  CREATININE 0.78  CALCIUM 9.3  GLUCOSE 99      Radiology/Studies: No results found.  Reviewed by myself: EKG: SR, 72bpm, PR , QRS 84ms, QT measured by myself is , corrected is , ST/T changes likely T wave memory (no symptoms of angina) 06/25/17: SR, LBBB morphology TELEMETRY: SR 70's, PACs  07/02/17: TTE Study Conclusions - Left ventricle: Systolic function was mildly to moderately   reduced. The estimated ejection fraction was in the range of 40%   to 45%. Features are consistent with a pseudonormal left   ventricular filling pattern, with concomitant abnormal relaxation   and increased filling pressure (grade 2 diastolic dysfunction). - Ventricular septum: Septal motion showed paradox. These changes   are consistent with a left bundle branch block. - Left atrium: The atrium was mildly dilated. - Pulmonary arteries: Systolic pressure was mildly increased. PA   peak pressure: 39 mm Hg (S).    Assessment and Plan:   1. Paroxysmal AFib, admitted for Tikosyn initiation     CHA2DS2Vasc is at least 4, on Eliquis     K+ 4.3     Mag 2.1     Creat 0.78 (Calculated Cr Cl is 55) will be dose     QTc is OK to start  Interestingly her last couple years of EKGs have noted LBBB that is resolved off Flecainide  2. HTN     Looks OK, no changes   Signed, Francis Dowse, PA-C 07/09/2017 5:12 PM  Pt seen and examined and interviewed  AFib paroxysmal  Cardiomyopathy on ACE  LBBB related to flecainide    apixoban dose is right on the edge of 2.5 from 5 mg with gradually decreasing weight  Will need to follow  For dofetilide initiation because of increasingly frequ Px AF.     Reviewed proarrhythmic risk

## 2017-07-09 NOTE — Progress Notes (Signed)
Primary Care Physician: Thayer Headings, MD Referring Physician: Dr. Leim Fabry Amanda Olsen is a 83 y.o. female with a h/o paroxysmal afib in the clinic for initial evaluation 6/26. She called and was c/o of increase afib burden and was referred here. She is [place] and speaks decent [place] but her daughter- in - law is helping with translation as well. Apparently, she has been on  flecainide since 2009. She had been deferring anticoagulation because of fear re bleeding and she has h/o low normal plts, but did agree 2 weeks ago to go on daily anticoagulation with 15 mg xarelto. She is anxious re bleeding. She has a chadsvasc score of at least 4. She reports that she has had 3 episodes of afib over the last month that  last less than an hour. He daughter- [place] states that she is has had a lot of pain with sciatica over the last couple of months and she feels the pain as well as the stress from the pain is triggering afib. Her BP is up today but she states that is usually well managed at home and she is anxious. She had N/V and was treated in the ER, presented as well with afib with rvr.  F/u in afib clinic 7/16,  as pt called earlier this am and stated that she was in  afib all weekend. This was a long epiosde for her. She currently has returned to SR by EKG. She is anxious and mildly diaphoretic in the office with BP initially around 90 sys but after water and recheck it is 116 sys.She states that she feels weak and tired today. No extra Caredizem today but did take lisinopril this am. Echo ordered and talked to pt again re changing antiarrythmic's.  Return f/u echo, 7/27. Echo showed EF  Now reduced to 40-45%, since last echo. It was discussed, she should come off flecainde for LV dysfunction. She did check on price of dofetilide and it will be very reasonable for her. Long discussion re dofetilide and coming into hospital for same. Pt's English is broken but appears to do well without interpretor.   She has a son and daughter in law that live in [place] and her husband is deceased so she is here without family support. Qtc appears long but she has an old LBBB contributing. Explained to her that flecainide and hctz will have to be stopped as of tomorrow to plan on tikosyn for Monday. She started eliquis in June and has had one refill, states has taken consistently without any missed doses. States no missed doses of DOAC. Does not use Benadryl. Crcl cal on last bmet, 6/11, 55.09 so she may only qualify for 250 mcg bid day. States that her afib over the last week has been quiet.  F/u in afib clinic, 7/30. She did not have any hctz or flecainide since last Thursday. She had a short episode of afib this am around 4 am that self converted. No missed doses of eliquis. EKG off flecainide shows improvement with QRS interval improved, LBBB resolved and now QTc improved at 433 ms. Explained to pt with now LV dysfunction on recent echo, LBBB on flecainide and increased afib burden recently that she needs change of antiarrythmic.Tikosyn previously discussed and she was agreeable. She went on to read about tikosyn over the weekend on internet and she states that she is "scared of strong medicine". Reassured. Her EKG now shows more prevalent ST/T wave abnormality,  she should possibly have  a f/u stress test later. She is currently denying any exertional symptoms.Labs pending.  Today, she denies symptoms of palpitations, chest pain, shortness of breath, orthopnea, PND, lower extremity edema, dizziness, presyncope, syncope, or neurologic sequela. The patient is tolerating medications without difficulties and is otherwise without complaint today.   Past Medical History:  Diagnosis Date  . Atrial fibrillation (HCC)    maintained in sinus on Flecainide  . Cataract   . ELECTROCARDIOGRAM, ABNORMAL   . HTN (hypertension)   . Rosacea    Past Surgical History:  Procedure Laterality Date  . VARICOSE VEIN SURGERY       Current Outpatient Prescriptions  Medication Sig Dispense Refill  . apixaban (ELIQUIS) 5 MG TABS tablet Take 1 tablet (5 mg total) by mouth 2 (two) times daily. 60 tablet 6  . calcium carbonate (OS-CAL) 600 MG TABS Take 600 mg by mouth daily.      . Cholecalciferol (VITAMIN D) 2000 UNITS tablet Take 2,000 Units by mouth daily.     . cyanocobalamin 2000 MCG tablet Take 2,050 mcg by mouth daily.     Marland Kitchen diltiazem (CARDIZEM) 30 MG tablet Take 1 tablet every 4 hours AS NEEDED for breakthrough afib heart rate >100 45 tablet 1  . lisinopril (PRINIVIL,ZESTRIL) 20 MG tablet Take 1 tablet (20 mg total) by mouth daily. 90 tablet 3   No current facility-administered medications for this encounter.     No Known Allergies  Social History   Social History  . Marital status: Widowed    Spouse name: N/A  . Number of children: N/A  . Years of education: N/A   Occupational History  . Not on file.   Social History Main Topics  . Smoking status: Never Smoker  . Smokeless tobacco: Never Used  . Alcohol use No  . Drug use: No  . Sexual activity: Not Currently   Other Topics Concern  . Not on file   Social History Narrative  . No narrative on file    Family History  Problem Relation Age of Onset  . Heart Problems Mother     ROS- All systems are reviewed and negative except as per the HPI above  Physical Exam: Vitals:   07/09/17 1025  BP: 126/74  Pulse: 72  Weight: 136 lb 3.2 oz (61.8 kg)  Height: 5\' 6"  (1.676 m)   Wt Readings from Last 3 Encounters:  07/09/17 136 lb 3.2 oz (61.8 kg)  07/05/17 136 lb 3.2 oz (61.8 kg)  06/25/17 135 lb 3.2 oz (61.3 kg)    Labs: Lab Results  Component Value Date   NA 138 07/05/2017   K 3.9 07/05/2017   CL 105 07/05/2017   CO2 24 07/05/2017   GLUCOSE 108 (H) 07/05/2017   BUN 14 07/05/2017   CREATININE 0.81 07/05/2017   CALCIUM 9.3 07/05/2017   MG 2.1 07/05/2017   Lab Results  Component Value Date   INR 4.4 (H) 05/03/2009   Lab  Results  Component Value Date   CHOL  07/02/2008    130        ATP III CLASSIFICATION:  <200     mg/dL   Desirable  161-096  mg/dL   Borderline High  >=045    mg/dL   High   HDL 42 40/98/1191   LDLCALC  07/02/2008    71        Total Cholesterol/HDL:CHD Risk Coronary Heart Disease Risk Table  Men   Women  1/2 Average Risk   3.4   3.3   TRIG 87 07/02/2008     GEN- The patient is well appearing, alert and oriented x 3 today.   Head- normocephalic, atraumatic Eyes-  Sclera clear, conjunctiva pink Ears- hearing intact Oropharynx- clear Neck- supple, no JVP Lymph- no cervical lymphadenopathy Lungs- Clear to ausculation bilaterally, normal work of breathing Heart- Regular rate and rhythm, no murmurs, rubs or gallops, PMI not laterally displaced GI- soft, NT, ND, + BS Extremities- no clubbing, cyanosis, or edema MS- no significant deformity or atrophy Skin- no rash or lesion Psych- euthymic mood, full affect Neuro- strength and sensation are intact  EKG- Ekg  Today SR at 72 bpm, pr int 76 ms, qrs int 84 ms, qtc 433 ms ST/ marked T wave abnormality, consider anterolateral ischemia EPic records reviewed Echo-Study Conclusions  - Left ventricle: Systolic function was mildly to moderately   reduced. The estimated ejection fraction was in the range of 40%   to 45%. Features are consistent with a pseudonormal left   ventricular filling pattern, with concomitant abnormal relaxation   and increased filling pressure (grade 2 diastolic dysfunction). - Ventricular septum: Septal motion showed paradox. These changes   are consistent with a left bundle branch block. - Left atrium: The atrium was mildly dilated. - Pulmonary arteries: Systolic pressure was mildly increased. PA   peak pressure: 39 mm Hg (S).   Assessment and Plan: 1. Paroxysmal afib Recent increased afib burden Echo shows LV dysfunction LBBB resolved off flecainide, Qtc now at 433 ms Would be best  to change antiarrythmic with increase in afib burden and LV dysfunction. Discussed options with pt again,  there is a mild language barrier. Pt would like to proceed with  hospitalization with tikosyn(dofetilide, will be affordable for her) She has been off  flecainide and hctz  since last Thursday, allowing  for 3 day washout before hospitalization today Reminded no benadryl, she does not use Contiues on eliquis, no missed doses, chadsvasc score of at least 5. Currently weight is 136 lbs, 61.8 kg, have to be mindful if she loses weight or renal function changes, she will need 2.5 mg eliquis bid Crcl cal at 55.09 so that she will most likely qualify for 250 mcg bid Bmet/mag today show levels of K+/mag acceptable for admission for  tikosyn  She will be admitted later today when there is an available bed   Lupita Leash [facility] [address] [place], [place] [address] [phone]

## 2017-07-10 DIAGNOSIS — Z79899 Other long term (current) drug therapy: Secondary | ICD-10-CM

## 2017-07-10 DIAGNOSIS — Z5181 Encounter for therapeutic drug level monitoring: Secondary | ICD-10-CM

## 2017-07-10 LAB — BASIC METABOLIC PANEL
Anion gap: 6 (ref 5–15)
BUN: 17 mg/dL (ref 6–20)
CO2: 25 mmol/L (ref 22–32)
Calcium: 9 mg/dL (ref 8.9–10.3)
Chloride: 108 mmol/L (ref 101–111)
Creatinine, Ser: 0.77 mg/dL (ref 0.44–1.00)
GFR calc Af Amer: >60 mL/min (ref >60)
GFR calc non Af Amer: >60 mL/min (ref >60)
Glucose, Bld: 86 mg/dL (ref 65–99)
Potassium: 4.1 mmol/L (ref 3.5–5.1)
Sodium: 139 mmol/L (ref 135–145)

## 2017-07-10 LAB — MAGNESIUM: Magnesium: 2.1 mg/dL (ref 1.7–2.4)

## 2017-07-10 NOTE — Progress Notes (Signed)
Progress Note  Patient Name: Amanda Olsen Date of Encounter: 07/10/2017  Primary Cardiologist: Dr. Eden Emms  Subjective   Feels well, ambulating in the hallways without difficulty  Inpatient Medications    Scheduled Meds: . apixaban  5 mg Oral BID  . dofetilide  250 mcg Oral BID  . lisinopril  20 mg Oral Daily  . sodium chloride flush  3 mL Intravenous Q12H   Continuous Infusions: . sodium chloride     PRN Meds: sodium chloride, sodium chloride flush   Vital Signs    Vitals:   07/09/17 2044 07/10/17 0059 07/10/17 0542 07/10/17 1151  BP: 110/67 134/74 (!) 145/63 (!) 141/72  Pulse: 61 (!) 54 (!) 54 (!) 57  Resp: Temp: 98.4 F (36.9 C) 97.9 F (36.6 C) 98.2 F (36.8 C) 98 F (36.7 C)  TempSrc: Oral Oral Oral Oral  SpO2: 96% 97% 100% 97%  Weight:   135 lb 6.4 oz (61.4 kg)   Height:        Intake/Output Summary (Last 24 hours) at 07/10/17 1250 Last data filed at 07/10/17 1610  Gross per 24 hour  Intake              635 ml  Output              200 ml  Net              435 ml   Filed Weights   07/09/17 1629 07/10/17 0542  Weight: 135 lb 4 oz (61.3 kg) 135 lb 6.4 oz (61.4 kg)    Telemetry    SB/SR 50's-70's - Personally Reviewed  ECG    SB, QTc stable - Personally Reviewed  Physical Exam   GEN: No acute distress.   Neck: No JVD Cardiac: RRR, no murmurs, rubs, or gallops.  Respiratory: Clear to auscultation bilaterally. GI: Soft, nontender, non-distended  MS: No edema; No deformity. Neuro:  Nonfocal  Psych: Normal affect   Labs    Chemistry Recent Labs Lab 07/05/17 1359 07/09/17 1044 07/10/17 0231  NA 138 139 139  K 3.9 4.3 4.1  CL 105 105 108  CO2 GLUCOSE 108* 99 86  BUN CREATININE 0.81 0.78 0.77  CALCIUM 9.3 9.3 9.0  GFRNONAA >60 >60 >60  GFRAA >60 >60 >60  ANIONGAP Hematology Recent Labs Lab 07/09/17 1044  WBC 4.2  RBC 4.45  HGB 13.6  HCT 41.6  MCV 93.5  MCH 30.6  MCHC  32.7  RDW 13.4  PLT 189      Radiology    No results found.  Cardiac Studies   07/02/17: TTE Study Conclusions - Left ventricle: Systolic function was mildly to moderately reduced. The estimated ejection fraction was in the range of 40% to 45%. Features are consistent with a pseudonormal left ventricular filling pattern, with concomitant abnormal relaxation and increased filling pressure (grade 2 diastolic dysfunction). - Ventricular septum: Septal motion showed paradox. These changes are consistent with a left bundle branch block. - Left atrium: The atrium was mildly dilated. - Pulmonary arteries: Systolic pressure was mildly increased. PA peak pressure: 39 mm Hg (S).  Patient Profile     83 y.o. female with PMHx of PAFib, HTN and LBBB was referred for admission for Tikosyn initiation via AFib clinic with increased episodes of palpitations and AF burden.   Assessment & Plan    1. Paroxysmal  AFib, admitted for Tikosyn initiation     CHA2DS2Vasc is at least 4, on Eliquis     K+ 4.1     Mag 2.1     Creat 0.77, stable     QTc is stable     Tolerating medicine well  D/w pharmacy, patient was taking an OTC herbal remedy, recommendation is not to contiune and avoid future supplements going forward, the patient is advised  2. HTN     Looks OK, no changes  3. Cardiomyopathy     Might consider repeating echo without LBBB and maintaining SR     No anginal complaints     T changes without LBBB felt to be T wave memory     On ACE   Signed, Sheilah Pigeon, PA-C  07/10/2017, 12:50 PM    I have seen and examined this patient with Francis Dowse.  Agree with above, note added to reflect my findings.  On exam, RRR, no murmurs, lungs clear. Admitted to the hospital for Tikosyn loading. In sinus rhythm this morning. QTc 463. Continue current dose at 250 g.    Will M. Camnitz MD 07/10/2017 2:07 PM

## 2017-07-10 NOTE — Consult Note (Signed)
   [facility] CM Inpatient Consult   07/10/2017  Amanda Olsen 1934-01-01 [id number]   Patient assessed for [facility] services and needs in the [facility] [name].  Patient admitted with Atrial Fibrillation.  Met with the patient at the bedside.  She endorses she lives alone but states she has friends that assist her with transportation.  She states she does not have problems with getting ro affording her medicines but she states she will be starting new medicines and will find out the price before she leaves. She states she will accept phone call follow up.  She was given a brochure with contact information.  She states, "no problems I know of."  Please contact for referrals or changes:  Natividad Brood, RN BSN [facility] Liaison  [phone] business mobile phone Toll free office [phone]

## 2017-07-10 NOTE — Progress Notes (Signed)
Patient did well with tikosyn, no real changes with post dose EKG. NSR.

## 2017-07-10 NOTE — Progress Notes (Signed)
RE: Benefit check  Received: Today  Message Contents  Rushie Goltz, RN        # 1. S/W DEBBIE @ [employer] [facility] # [phone]    1. TIKOSYN 250 MCG BID   COVER- NOT COVER  PRIOR APPROVAL- - MUST TRY THE GENERIC FIRST   2. DOFETILIDE 250 MCG BID   COVER- YES  CO-PAY- $ 1.25 Q/L 120 PILLS  TIER- 4 DRUG  PRIOR APPROVAL- NO   PATIENT : [place] 05-10-2011  CO-PAY- $ 3.70   PHARMACY : [facility]

## 2017-07-10 NOTE — Progress Notes (Signed)
0825 - EKG results are:  Vent rate 53 BPM         PR interval 186         QRS 88         QT/QTc 460/431  0831 - Tikosyn given.  1150 - EKG results are:  Vent rate 53 BPM         QRS 90         QT/QTc 474/444

## 2017-07-11 ENCOUNTER — Other Ambulatory Visit: Payer: Self-pay

## 2017-07-11 LAB — BASIC METABOLIC PANEL
Anion gap: 5 (ref 5–15)
BUN: 12 mg/dL (ref 6–20)
CO2: 24 mmol/L (ref 22–32)
Calcium: 8.8 mg/dL — ABNORMAL LOW (ref 8.9–10.3)
Chloride: 107 mmol/L (ref 101–111)
Creatinine, Ser: 0.71 mg/dL (ref 0.44–1.00)
GFR calc Af Amer: >60 mL/min (ref >60)
GFR calc non Af Amer: >60 mL/min (ref >60)
Glucose, Bld: 100 mg/dL — ABNORMAL HIGH (ref 65–99)
Potassium: 3.7 mmol/L (ref 3.5–5.1)
Sodium: 136 mmol/L (ref 135–145)

## 2017-07-11 LAB — MAGNESIUM: Magnesium: 2.1 mg/dL (ref 1.7–2.4)

## 2017-07-11 MED ORDER — POTASSIUM CHLORIDE CRYS ER 20 MEQ PO TBCR: 30.0000 meq | EXTENDED_RELEASE_TABLET | Freq: Two times a day (BID) | ORAL | Status: DC

## 2017-07-11 MED ORDER — POTASSIUM CHLORIDE CRYS ER 20 MEQ PO TBCR
30.0000 meq | EXTENDED_RELEASE_TABLET | Freq: Once | ORAL | Status: AC
  Administered 2017-07-11: 30 meq via ORAL
  Filled 2017-07-11: qty 1

## 2017-07-11 NOTE — Progress Notes (Signed)
Progress Note  Patient Name: Amanda Olsen Date of Encounter: 07/11/2017  Primary Cardiologist: Dr. Eden Emms  Subjective   Feeling well without complaint  Inpatient Medications    Scheduled Meds: . apixaban  5 mg Oral BID  . dofetilide  250 mcg Oral BID  . lisinopril  20 mg Oral Daily  . potassium chloride  30 mEq Oral Once  . sodium chloride flush  3 mL Intravenous Q12H   Continuous Infusions: . sodium chloride     PRN Meds: sodium chloride, sodium chloride flush   Vital Signs    Vitals:   07/10/17 0542 07/10/17 1151 07/10/17 1943 07/11/17 0530  BP: (!) 145/63 (!) 141/72 135/66 (!) 146/82  Pulse: (!) 54 (!) 57 (!) 59 61  Resp: 18 18 18 20   Temp: 98.2 F (36.8 C) 98 F (36.7 C) 98.1 F (36.7 C) 98.2 F (36.8 C)  TempSrc: Oral Oral Oral Oral  SpO2: 100% 97% 99% 98%  Weight: 135 lb 6.4 oz (61.4 kg)   135 lb 9.6 oz (61.5 kg)  Height:        Intake/Output Summary (Last 24 hours) at 07/11/17 0749 Last data filed at 07/11/17 0532  Gross per 24 hour  Intake             1115 ml  Output             1900 ml  Net             -785 ml   Filed Weights   07/09/17 1629 07/10/17 0542 07/11/17 0530  Weight: 135 lb 4 oz (61.3 kg) 135 lb 6.4 oz (61.4 kg) 135 lb 9.6 oz (61.5 kg)    Telemetry    Sinus rhythm, run of WCT 3 beats followed by sinus beat followed by 3 beat run - Personally Reviewed  ECG    SR, QTc 444 - Personally Reviewed  Physical Exam   GEN: Well nourished, well developed, in no acute distress  HEENT: normal  Neck: no JVD, carotid bruits, or masses Cardiac: RRR; no murmurs, rubs, or gallops,no edema  Respiratory:  clear to auscultation bilaterally, normal work of breathing GI: soft, nontender, nondistended, + BS MS: no deformity or atrophy  Skin: warm and dry Neuro:  Strength and sensation are intact Psych: euthymic mood, full affect    Labs    Chemistry  Recent Labs Lab 07/09/17 1044 07/10/17 0231 07/11/17 0213  NA 139 139 136  K  4.3 4.1 3.7  CL 105 108 107  CO2 27 25 24   GLUCOSE 99 86 100*  BUN 14 17 12   CREATININE 0.78 0.77 0.71  CALCIUM 9.3 9.0 8.8*  GFRNONAA >60 >60 >60  GFRAA >60 >60 >60  ANIONGAP 7 6 5      Hematology  Recent Labs Lab 07/09/17 1044  WBC 4.2  RBC 4.45  HGB 13.6  HCT 41.6  MCV 93.5  MCH 30.6  MCHC 32.7  RDW 13.4  PLT 189      Radiology    No results found.  Cardiac Studies   07/02/17: TTE Study Conclusions - Left ventricle: Systolic function was mildly to moderately reduced. The estimated ejection fraction was in the range of 40% to 45%. Features are consistent with a pseudonormal left ventricular filling pattern, with concomitant abnormal relaxation and increased filling pressure (grade 2 diastolic dysfunction). - Ventricular septum: Septal motion showed paradox. These changes are consistent with a left bundle branch block. - Left atrium: The atrium was mildly  dilated. - Pulmonary arteries: Systolic pressure was mildly increased. PA peak pressure: 39 mm Hg (S).  Patient Profile     83 y.o. female with PMHx of PAFib, HTN and LBBB was referred for admission for Tikosyn initiation via AFib clinic with increased episodes of palpitations and AF burden.   Assessment & Plan    1. Paroxysmal AFib, admitted for Tikosyn initiation     CHA2DS2Vasc is at least 4, on Eliquis     K+ 3.7, will replete     Mag 2.1     Creat 0.71     QTc unchanged from prior     Tolerating medication well  Had run of 3 beats of NSVT, will continue to monitor. If more or polymorphic may not tolerate tikosyn  2. HTN     Mildly elevated. Will check again post AM meds  3. Cardiomyopathy     EF low on most recent echo. May be repeated in a few months after being on OMT.   Signed, Will Jorja Loa, MD  07/11/2017, 7:49 AM

## 2017-07-11 NOTE — Progress Notes (Signed)
Pt is alert and oriented up and walking to the bathroom steady with no distress.

## 2017-07-12 ENCOUNTER — Other Ambulatory Visit: Payer: Self-pay | Admitting: Physician Assistant

## 2017-07-12 LAB — BASIC METABOLIC PANEL
Anion gap: 5 (ref 5–15)
BUN: 12 mg/dL (ref 6–20)
CO2: 25 mmol/L (ref 22–32)
Calcium: 9 mg/dL (ref 8.9–10.3)
Chloride: 107 mmol/L (ref 101–111)
Creatinine, Ser: 0.73 mg/dL (ref 0.44–1.00)
GFR calc Af Amer: >60 mL/min (ref >60)
GFR calc non Af Amer: >60 mL/min (ref >60)
Glucose, Bld: 91 mg/dL (ref 65–99)
Potassium: 3.7 mmol/L (ref 3.5–5.1)
Sodium: 137 mmol/L (ref 135–145)

## 2017-07-12 LAB — MAGNESIUM: Magnesium: 2.1 mg/dL (ref 1.7–2.4)

## 2017-07-12 MED ORDER — POTASSIUM CHLORIDE CRYS ER 20 MEQ PO TBCR
20.0000 meq | EXTENDED_RELEASE_TABLET | Freq: Once | ORAL | Status: AC
  Administered 2017-07-12: 20 meq via ORAL
  Filled 2017-07-12: qty 1

## 2017-07-12 MED ORDER — POTASSIUM CHLORIDE ER 20 MEQ PO TBCR: 20.0000 meq | EXTENDED_RELEASE_TABLET | Freq: Every day | ORAL | 3 refills | Status: DC

## 2017-07-12 MED ORDER — DOFETILIDE 250 MCG PO CAPS: 250.0000 ug | ORAL_CAPSULE | Freq: Two times a day (BID) | ORAL | 6 refills | Status: DC

## 2017-07-12 MED ORDER — POTASSIUM CHLORIDE CRYS ER 20 MEQ PO TBCR: 30.0000 meq | EXTENDED_RELEASE_TABLET | Freq: Once | ORAL | Status: DC

## 2017-07-12 MED ORDER — POTASSIUM CHLORIDE CRYS ER 20 MEQ PO TBCR
40.0000 meq | EXTENDED_RELEASE_TABLET | Freq: Once | ORAL | Status: AC
  Administered 2017-07-12: 40 meq via ORAL
  Filled 2017-07-12: qty 2

## 2017-07-12 NOTE — Progress Notes (Signed)
50 - Tikosyn given. 1144 - EKG performed with the following results:             Vent rate = 56 BPM  PR = 180  QRS = 86  QT/QTc = 470/453

## 2017-07-12 NOTE — Discharge Instructions (Signed)

## 2017-07-12 NOTE — Discharge Summary (Signed)
ELECTROPHYSIOLOGY PROCEDURE DISCHARGE SUMMARY    Patient ID: Amanda Olsen,  MRN: [id number], DOB/AGE: 1934/03/09 83 y.o.  Admit date: 07/09/2017 Discharge date: 07/12/2017  Primary Care Physician: Thayer Headings, MD  Primary Cardiologist: Dr. Eden Emms Electrophysiologist: new to Dr. Elberta Fortis  Primary Discharge Diagnosis:  1.  Paroxysmal atrial fibrillation status post Tikosyn loading this admission  Secondary Discharge Diagnosis:  1. HTN 2. LBBB historically, not appreciated since off Flecainide  No Known Allergies   Procedures This Admission:  1.  Tikosyn loading    Brief HPI: Amanda Olsen is a 83 y.o. female with a past medical history as noted above.  She was referred to the AFib clinic in the outpatient setting for treatment options of atrial fibrillation.  Risks, benefits, and alternatives to Tikosyn were reviewed with the patient who wished to proceed.    Hospital Course:  The patient was admitted and Tikosyn was initiated.  Renal function and electrolytes were followed during the hospitalization, her potassium required replacement and will send her home with KDur.  Her QTc remained stable.  She arrived in SR and maintained SB/SR through her stay. The patient was monitored until discharge on telemetry which demonstrated SB/SR, generally 50-60 range, she had brief wide complex rhythm was irregular and felt to have been AF.  The patient mention an episode of chills overnight that spontaneously resolved, she denies any urinary symptoms or symptoms of illness, and is afebrile, she reports this as not a new symptom for her happens occasionally over the years, given this and no symptoms, she has been instructed to f/u with her PMD regarding this.  She had an echo done via the AF clinic noting EF reduced to 40-45%, she has no symptoms of fluid OL, no anginal symptoms of any kind, on an ACE.  LBBB is resolved off Flecainide since the time of her echo, T changes felt to be T  wave memory from T wave memory/LBBB, will have her follow up with Dr. Joya Gaskins cardiology as well.  On the day of discharge, she was examined by Dr Elberta Fortis who considered the patient stable for discharge to home.  Follow-up has been arranged with the AFib clinic in 1 week for visit, labs,EKG and with Dr Elberta Fortis in 4 weeks, d/w Dr. Eden Emms, will plan f/u echo in 3 months with cardiology f/u.  Physical Exam: Vitals:   07/11/17 1301 07/11/17 1955 07/12/17 0518 07/12/17 1151  BP: 130/78 131/69 (!) 152/75 135/76  Pulse: 72 60 (!) 56 62  Resp: 18 18 20 20   Temp: 98 F (36.7 C) 98 F (36.7 C) 98.3 F (36.8 C) 98.2 F (36.8 C)  TempSrc: Oral Oral Oral Oral  SpO2: 98% 100% 98% 99%  Weight:   135 lb 6.4 oz (61.4 kg)   Height:        GEN- The patient is well appearing, alert and oriented x 3 today.   HEENT: normocephalic, atraumatic; sclera clear, conjunctiva pink; hearing intact; oropharynx clear; neck supple, no JVP Lymph- no cervical lymphadenopathy Lungs- CTA b/l, normal work of breathing.  No wheezes, rales, rhonchi Heart- RRR, no murmurs, rubs or gallops, PMI not laterally displaced GI- soft, non-tender, non-distended Extremities- no clubbing, cyanosis, or edema MS- no significant deformity or atrophy Skin- warm and dry, no rash or lesion Psych- euthymic mood, full affect Neuro- strength and sensation are intact   Labs:   Lab Results  Component Value Date   WBC 4.2 07/09/2017   HGB 13.6 07/09/2017  HCT 41.6 07/09/2017   MCV 93.5 07/09/2017   PLT 189 07/09/2017     Recent Labs Lab 07/12/17 0303  NA 137  K 3.7  CL 107  CO2 25  BUN 12  CREATININE 0.73  CALCIUM 9.0  GLUCOSE 91     Discharge Medications:  Allergies as of 07/12/2017   No Known Allergies     Medication List    TAKE these medications   apixaban 5 MG Tabs tablet Commonly known as:  ELIQUIS Take 1 tablet (5 mg total) by mouth 2 (two) times daily.   diltiazem 30 MG tablet Commonly known as:   CARDIZEM Take 1 tablet every 4 hours AS NEEDED for breakthrough afib heart rate >100   dofetilide 250 MCG capsule Commonly known as:  TIKOSYN Take 1 capsule (250 mcg total) by mouth 2 (two) times daily.   lisinopril 20 MG tablet Commonly known as:  PRINIVIL,ZESTRIL Take 1 tablet (20 mg total) by mouth daily.   Potassium Chloride ER 20 MEQ Tbcr Take 20 mEq by mouth daily.   UNKNOWN TO PATIENT Digestive Chinese Herbs : Quiet Digestive       Disposition:  Home Discharge Instructions    Diet - low sodium heart healthy    Complete by:  As directed    Increase activity slowly    Complete by:  As directed      Follow-up Information    [facility] Follow up on 07/19/2017.   Specialty:  Cardiology Why:  11:00AM Contact information: [address] [id number] mc [address] [place] [address] [phone]       Regan Lemming, MD Follow up on 08/14/2017.   Specialty:  Cardiology Why:  11;00AM Contact information: [address] [place] [address] [phone]           Duration of Discharge Encounter: Greater than 30 minutes including physician time.  Signed, Francis Dowse, PA-C 07/12/2017 1:48 PM    I have seen and examined this patient with Francis Dowse.  Agree with above, note added to reflect my findings.  On exam, RRR, no murmurs, lungs clear. Admitted for tikosyn loading. Tolerated dose at 250 mcg BID with stable QTc. Had short runs of breakthrough AF. Plan for discharge today with follow up in clinic.    Will M. Camnitz MD 07/12/2017 1:53 PM

## 2017-07-12 NOTE — Progress Notes (Signed)
Orders received for pt discharge.  Discharge summary printed and reviewed with pt.  Explained medication regimen, specifically how to take Tikosyn (every 12 hrs), and pt had no further questions at this time.  IV removed and site remains clean, dry, intact.  Telemetry removed.  Pt in stable condition. Pt preferred to call an [facility] Goad herself for transportation, and firmly refused wheelchair exit.

## 2017-07-13 ENCOUNTER — Telehealth: Payer: Self-pay | Admitting: Physician Assistant

## 2017-07-13 NOTE — Telephone Encounter (Signed)
I called the patient (noting the digestive chinese herb was left her home list at discharge) to remind her of our discussion in the hospital that she NOT continue herbal remedies with Tikosyn.  She had remembered and reported that she has not had any GI  Issue or need anyway.  She is instructed that prior to starting any kind of supplementation or medication prescribed or OTC to check with her pharmacist.  She states understanding.  She confirmed getting her week supply and is feeling well.  No palpitations or symptoms.  Will remove the herbal medicine from her medication list.  Francis Dowse, PA-C

## 2017-07-16 ENCOUNTER — Encounter: Payer: Self-pay | Admitting: *Deleted

## 2017-07-19 ENCOUNTER — Ambulatory Visit (HOSPITAL_COMMUNITY)
Admit: 2017-07-19 | Discharge: 2017-07-19 | Disposition: A | Discharge status: Home / Self Care | Payer: Medicare HMO | Attending: Nurse Practitioner | Admitting: Nurse Practitioner

## 2017-07-19 ENCOUNTER — Encounter (HOSPITAL_COMMUNITY): Payer: Self-pay | Admitting: Nurse Practitioner

## 2017-07-19 VITALS — BP 118/64 | HR 66 | Ht 66.0 in | Wt 136.0 lb

## 2017-07-19 DIAGNOSIS — I480 Paroxysmal atrial fibrillation: Secondary | ICD-10-CM | POA: Insufficient documentation

## 2017-07-19 DIAGNOSIS — I517 Cardiomegaly: Secondary | ICD-10-CM | POA: Diagnosis not present

## 2017-07-19 DIAGNOSIS — I10 Essential (primary) hypertension: Secondary | ICD-10-CM | POA: Insufficient documentation

## 2017-07-19 DIAGNOSIS — H269 Unspecified cataract: Secondary | ICD-10-CM | POA: Diagnosis not present

## 2017-07-19 DIAGNOSIS — R9431 Abnormal electrocardiogram [ECG] [EKG]: Secondary | ICD-10-CM | POA: Diagnosis not present

## 2017-07-19 DIAGNOSIS — L719 Rosacea, unspecified: Secondary | ICD-10-CM | POA: Diagnosis not present

## 2017-07-19 LAB — BASIC METABOLIC PANEL
Anion gap: 3 — ABNORMAL LOW (ref 5–15)
BUN: 11 mg/dL (ref 6–20)
CO2: 31 mmol/L (ref 22–32)
Calcium: 9.4 mg/dL (ref 8.9–10.3)
Chloride: 105 mmol/L (ref 101–111)
Creatinine, Ser: 0.77 mg/dL (ref 0.44–1.00)
GFR calc Af Amer: >60 mL/min (ref >60)
GFR calc non Af Amer: >60 mL/min (ref >60)
Glucose, Bld: 101 mg/dL — ABNORMAL HIGH (ref 65–99)
Potassium: 4 mmol/L (ref 3.5–5.1)
Sodium: 139 mmol/L (ref 135–145)

## 2017-07-19 LAB — MAGNESIUM: Magnesium: 2.3 mg/dL (ref 1.7–2.4)

## 2017-07-19 NOTE — Progress Notes (Signed)
Primary Care Physician: Thayer Headings, MD Referring Physician: Dr. Duanne Moron is a 83 y.o. female with a h/o paroxysmal afib in the clinic for initial evaluation 6/26. She called and was c/o of increase afib burden and was referred here. She is [place] and speaks decent [place] but her daughter- in - law is helping with translation as well. Apparently, she has been on  flecainide since 2009. She had been deferring anticoagulation because of fear re bleeding and she has h/o low normal plts, but did agree 2 weeks ago to go on daily anticoagulation with 15 mg xarelto. She is anxious re bleeding. She has a chadsvasc score of at least 4. She reports that she has had 3 episodes of afib over the last month that  last less than an hour. He daughter- [place] states that she is has had a lot of pain with sciatica over the last couple of months and she feels the pain as well as the stress from the pain is triggering afib. Her BP is up today but she states that is usually well managed at home and she is anxious. She had N/V and was treated in the ER, presented as well with afib with rvr.  F/u in afib clinic 7/16,  as pt called earlier this am and stated that she was in  afib all weekend. This was a long epiosde for her. She currently has returned to SR by EKG. She is anxious and mildly diaphoretic in the office with BP initially around 90 sys but after water and recheck it is 116 sys.She states that she feels weak and tired today. No extra Caredizem today but did take lisinopril this am. Echo ordered and talked to pt again re changing antiarrythmic's.  Return f/u echo, 7/27. Echo showed EF  Now reduced to 40-45%, since last echo. It was discussed, she should come off flecainde for LV dysfunction. She did check on price of dofetilide and it will be very reasonable for her. Long discussion re dofetilide and coming into hospital for same. Pt's English is broken but appears to do well without interpretor.   She has a son and daughter in law that live in [place] and her husband is deceased so she is here without family support. Qtc appears long but she has an old LBBB contributing. Explained to her that flecainide and hctz will have to be stopped as of tomorrow to plan on tikosyn for Monday. She started eliquis in June and has had one refill, states has taken consistently without any missed doses. States no missed doses of DOAC. Does not use Benadryl. Crcl cal on last bmet, 6/11, 55.09 so she may only qualify for 250 mcg bid day. States that her afib over the last week has been quiet.  F/u in afib clinic, 7/30. She did not have any hctz or flecainide since last Thursday. She had a short episode of afib this am around 4 am that self converted. No missed doses of eliquis. EKG off flecainide shows improvement with QRS interval improved, LBBB resolved and now QTc improved at 433 ms. Explained to pt with now LV dysfunction on recent echo, LBBB on flecainide and increased afib burden recently that she needs change of antiarrythmic.Tikosyn previously discussed and she was agreeable. She went on to read about tikosyn over the weekend on internet and she states that she is "scared of strong medicine". Reassured. Her EKG now shows more prevalent ST/T wave abnormality,  she should possibly  have a f/u stress test later. She is currently denying any exertional symptoms.Labs pending.  F/u hospitalization for tikosyn 8/9. She is staying in SR. She feels better at ease not having the spells of afib. She has picked up her rx of tikosyn and it was very affordable.  She knows to avoid benadryl and to take Tikosyn  on a regular bias, no missed doses.  Today, she denies symptoms of palpitations, chest pain, shortness of breath, orthopnea, PND, lower extremity edema, dizziness, presyncope, syncope, or neurologic sequela. The patient is tolerating medications without difficulties and is otherwise without complaint today.   Past Medical  History:  Diagnosis Date  . Atrial fibrillation (HCC)    maintained in sinus on Flecainide  . Cataract   . ELECTROCARDIOGRAM, ABNORMAL   . HTN (hypertension)   . Rosacea    Past Surgical History:  Procedure Laterality Date  . VARICOSE VEIN SURGERY      Current Outpatient Prescriptions  Medication Sig Dispense Refill  . apixaban (ELIQUIS) 5 MG TABS tablet Take 1 tablet (5 mg total) by mouth 2 (two) times daily. 60 tablet 6  . diltiazem (CARDIZEM) 30 MG tablet Take 1 tablet every 4 hours AS NEEDED for breakthrough afib heart rate >100 45 tablet 1  . dofetilide (TIKOSYN) 250 MCG capsule Take 1 capsule (250 mcg total) by mouth 2 (two) times daily. 60 capsule 6  . lisinopril (PRINIVIL,ZESTRIL) 20 MG tablet Take 1 tablet (20 mg total) by mouth daily. 90 tablet 3  . potassium chloride 20 MEQ TBCR Take 20 mEq by mouth daily. 30 tablet 3   No current facility-administered medications for this encounter.     No Known Allergies  Social History   Social History  . Marital status: Widowed    Spouse name: N/A  . Number of children: N/A  . Years of education: N/A   Occupational History  . Not on file.   Social History Main Topics  . Smoking status: Never Smoker  . Smokeless tobacco: Never Used  . Alcohol use No  . Drug use: No  . Sexual activity: Not Currently   Other Topics Concern  . Not on file   Social History Narrative  . No narrative on file    Family History  Problem Relation Age of Onset  . Heart Problems Mother     ROS- All systems are reviewed and negative except as per the HPI above  Physical Exam: Vitals:   07/19/17 1059  BP: 118/64  Pulse: 66  Weight: 136 lb (61.7 kg)  Height: 5\' 6"  (1.676 m)   Wt Readings from Last 3 Encounters:  07/19/17 136 lb (61.7 kg)  07/12/17 135 lb 6.4 oz (61.4 kg)  07/09/17 136 lb 3.2 oz (61.8 kg)    Labs: Lab Results  Component Value Date   NA 137 07/12/2017   K 3.7 07/12/2017   CL 107 07/12/2017   CO2 25  07/12/2017   GLUCOSE 91 07/12/2017   BUN 12 07/12/2017   CREATININE 0.73 07/12/2017   CALCIUM 9.0 07/12/2017   MG 2.1 07/12/2017   Lab Results  Component Value Date   INR 4.4 (H) 05/03/2009   Lab Results  Component Value Date   CHOL  07/02/2008    130        ATP III CLASSIFICATION:  <200     mg/dL   Desirable  458-592  mg/dL   Borderline High  >=924    mg/dL   High   HDL  42 07/02/2008   LDLCALC  07/02/2008    71        Total Cholesterol/HDL:CHD Risk Coronary Heart Disease Risk Table                     Men   Women  1/2 Average Risk   3.4   3.3   TRIG 87 07/02/2008     GEN- The patient is well appearing, alert and oriented x 3 today.   Head- normocephalic, atraumatic Eyes-  Sclera clear, conjunctiva pink Ears- hearing intact Oropharynx- clear Neck- supple, no JVP Lymph- no cervical lymphadenopathy Lungs- Clear to ausculation bilaterally, normal work of breathing Heart- Regular rate and rhythm, no murmurs, rubs or gallops, PMI not laterally displaced GI- soft, NT, ND, + BS Extremities- no clubbing, cyanosis, or edema MS- no significant deformity or atrophy Skin- no rash or lesion Psych- euthymic mood, full affect Neuro- strength and sensation are intact  EKG-   SR at 66 bpm, pr int  184 ms, qrs int 84 ms, qtc 440 ms ST/ marked T wave abnormality, consider inf/ anterolateral ischemia Epic records reviewed Echo-Study Conclusions  - Left ventricle: Systolic function was mildly to moderately   reduced. The estimated ejection fraction was in the range of 40%   to 45%. Features are consistent with a pseudonormal left   ventricular filling pattern, with concomitant abnormal relaxation   and increased filling pressure (grade 2 diastolic dysfunction). - Ventricular septum: Septal motion showed paradox. These changes   are consistent with a left bundle branch block. - Left atrium: The atrium was mildly dilated. - Pulmonary arteries: Systolic pressure was mildly  increased. PA   peak pressure: 39 mm Hg (S).   Assessment and Plan: 1. Paroxysmal afib Failed flecainide with recent increase in burden LBBB that was present with flecainide has resolved off flecainide Qtc acceptable today at 440 ms Bmet/mag General precautions with tikosyn again reviewed Knows to avoid benadryl  F/u with Dr. Elberta Fortis 9/4 F/u with Dr. Eden Emms  In November as scheduled afib clinic as needed   Elvina Sidle. [facility] [address] [place], [place] [address] [phone]

## 2017-07-20 DIAGNOSIS — M545 Low back pain: Secondary | ICD-10-CM | POA: Diagnosis not present

## 2017-07-23 DIAGNOSIS — Z09 Encounter for follow-up examination after completed treatment for conditions other than malignant neoplasm: Secondary | ICD-10-CM | POA: Diagnosis not present

## 2017-07-23 DIAGNOSIS — I4891 Unspecified atrial fibrillation: Secondary | ICD-10-CM | POA: Diagnosis not present

## 2017-07-27 DIAGNOSIS — M545 Low back pain: Secondary | ICD-10-CM | POA: Diagnosis not present

## 2017-08-02 DIAGNOSIS — M545 Low back pain: Secondary | ICD-10-CM | POA: Diagnosis not present

## 2017-08-03 ENCOUNTER — Ambulatory Visit: Payer: Medicare HMO | Admitting: Cardiology

## 2017-08-14 ENCOUNTER — Encounter: Payer: Self-pay | Admitting: Cardiology

## 2017-08-14 ENCOUNTER — Ambulatory Visit (INDEPENDENT_AMBULATORY_CARE_PROVIDER_SITE_OTHER): Payer: Medicare HMO | Admitting: Cardiology

## 2017-08-14 VITALS — BP 128/82 | HR 62 | Ht 66.0 in | Wt 136.4 lb

## 2017-08-14 DIAGNOSIS — I480 Paroxysmal atrial fibrillation: Secondary | ICD-10-CM | POA: Diagnosis not present

## 2017-08-14 DIAGNOSIS — I10 Essential (primary) hypertension: Secondary | ICD-10-CM

## 2017-08-14 NOTE — Progress Notes (Signed)
Electrophysiology Office Note   Date:  08/14/2017   ID:  TWANYA BLACKETER, DOB Jul 13, 1934, MRN [id number]  PCP:  Thayer Headings, MD  Cardiologist:  Eden Emms Primary Electrophysiologist:  Will Jorja Loa, MD    Chief Complaint  Patient presents with  . Follow-up    PAF     History of Present Illness: MIKALE SHAREEF is a 83 y.o. female who is being seen today for the evaluation of atrial fibrillation at the request of Thayer Headings, MD. Presenting today for electrophysiology evaluation. History of paroxysmal atrial fibrillation, HTN. Had tikosyn load 7/30 after having LBBB on flecainide which has since resolved.    Today, she denies symptoms of palpitations, chest pain, shortness of breath, orthopnea, PND, lower extremity edema, claudication, dizziness, presyncope, syncope, bleeding, or neurologic sequela. The patient is tolerating medications without difficulties.  She has noted no further episodes of atrial fibrillation. She does have some days where she feels fatigued, but this is quite different from her episodes of atrial fibrillation.   Past Medical History:  Diagnosis Date  . Atrial fibrillation (HCC)    maintained in sinus on Flecainide  . Cataract   . ELECTROCARDIOGRAM, ABNORMAL   . HTN (hypertension)   . Rosacea    Past Surgical History:  Procedure Laterality Date  . VARICOSE VEIN SURGERY       Current Outpatient Prescriptions  Medication Sig Dispense Refill  . apixaban (ELIQUIS) 5 MG TABS tablet Take 1 tablet (5 mg total) by mouth 2 (two) times daily. 60 tablet 6  . diltiazem (CARDIZEM) 30 MG tablet Take 1 tablet every 4 hours AS NEEDED for breakthrough afib heart rate >100 45 tablet 1  . dofetilide (TIKOSYN) 250 MCG capsule Take 1 capsule (250 mcg total) by mouth 2 (two) times daily. 60 capsule 6  . lisinopril (PRINIVIL,ZESTRIL) 20 MG tablet Take 1 tablet (20 mg total) by mouth daily. 90 tablet 3  . potassium chloride 20 MEQ TBCR Take 20 mEq by mouth  daily. 30 tablet 3   No current facility-administered medications for this visit.     Allergies:   Patient has no known allergies.   Social History:  The patient  reports that she has never smoked. She has never used smokeless tobacco. She reports that she does not drink alcohol or use drugs.   Family History:  The patient's family history includes Heart Problems in her mother.    ROS:  Please see the history of present illness.   Otherwise, review of systems is positive for palpitations, back pain, rash, easy bruising.   All other systems are reviewed and negative.    PHYSICAL EXAM: VS:  BP 128/82   Pulse 62   Ht 5\' 6"  (1.676 m)   Wt 136 lb 6.4 oz (61.9 kg)   SpO2 98%   BMI 22.02 kg/m  , BMI Body mass index is 22.02 kg/m. GEN: Well nourished, well developed, in no acute distress  HEENT: normal  Neck: no JVD, carotid bruits, or masses Cardiac: RRR; no murmurs, rubs, or gallops,no edema  Respiratory:  clear to auscultation bilaterally, normal work of breathing GI: soft, nontender, nondistended, + BS MS: no deformity or atrophy  Skin: warm and dry Neuro:  Strength and sensation are intact Psych: euthymic mood, full affect  EKG:  EKG is ordered today. Personal review of the ekg ordered shows SR, rate 62, lateral ST changes (old), QTc 460  Recent Labs: 03/27/2017: ALT 26; B Natriuretic Peptide 161.2 06/25/2017: TSH 4.333  07/09/2017: Hemoglobin 13.6; Platelets 189 07/19/2017: BUN 11; Creatinine, Ser 0.77; Magnesium 2.3; Potassium 4.0; Sodium 139    Lipid Panel     Component Value Date/Time   CHOL  07/02/2008 0425    130        ATP III CLASSIFICATION:  <200     mg/dL   Desirable  440-347  mg/dL   Borderline High  >=425    mg/dL   High   TRIG 87 95/63/8756 0425   HDL 42 07/02/2008 0425   CHOLHDL 3.1 07/02/2008 0425   VLDL 17 07/02/2008 0425   LDLCALC  07/02/2008 0425    71        Total Cholesterol/HDL:CHD Risk Coronary Heart Disease Risk Table                     Men    Women  1/2 Average Risk   3.4   3.3     Wt Readings from Last 3 Encounters:  08/14/17 136 lb 6.4 oz (61.9 kg)  07/19/17 136 lb (61.7 kg)  07/12/17 135 lb 6.4 oz (61.4 kg)      Other studies Reviewed: Additional studies/ records that were reviewed today include: TTE 07/02/17  Review of the above records today demonstrates:  - Left ventricle: Systolic function was mildly to moderately   reduced. The estimated ejection fraction was in the range of 40%   to 45%. Features are consistent with a pseudonormal left   ventricular filling pattern, with concomitant abnormal relaxation   and increased filling pressure (grade 2 diastolic dysfunction). - Ventricular septum: Septal motion showed paradox. These changes   are consistent with a left bundle branch block. - Left atrium: The atrium was mildly dilated. - Pulmonary arteries: Systolic pressure was mildly increased. PA   peak pressure: 39 mm Hg (S).   ASSESSMENT AND PLAN:  1.  Paroxysmal atrial fibrillation: on Eliquis and Tikosyn. Has had no further episodes of atrial fibrillation since her Tikosyn load. Continue at current dose.  This patients CHA2DS2-VASc Score and unadjusted Ischemic Stroke Rate (% per year) is equal to 4.8 % stroke rate/year from a score of 4  Above score calculated as 1 point each if present [CHF, HTN, DM, Vascular=MI/PAD/Aortic Plaque, Age if 65-74, or Female] Above score calculated as 2 points each if present [Age > 75, or Stroke/TIA/TE]   2. Hypertension: Currently well controlled. No changes at this time.  Current medicines are reviewed at length with the patient today.   The patient does not have concerns regarding her medicines.  The following changes were made today:  none  Labs/ tests ordered today include:  Orders Placed This Encounter  Procedures  . EKG 12-Lead     Disposition:   FU with Will Camnitz 6 months  Signed, Will Jorja Loa, MD  08/14/2017 11:18 AM     [facility] [address] [place] [address] [phone] (office) [phone] (fax)

## 2017-08-14 NOTE — Patient Instructions (Signed)

## 2017-09-10 DIAGNOSIS — R079 Chest pain, unspecified: Secondary | ICD-10-CM | POA: Diagnosis not present

## 2017-09-11 ENCOUNTER — Encounter (HOSPITAL_COMMUNITY): Payer: Self-pay | Admitting: Emergency Medicine

## 2017-09-11 ENCOUNTER — Emergency Department (HOSPITAL_COMMUNITY)
Admission: EM | Admit: 2017-09-11 | Discharge: 2017-09-11 | Disposition: A | Discharge status: Home / Self Care | Payer: Medicare HMO | Attending: Emergency Medicine | Admitting: Emergency Medicine

## 2017-09-11 ENCOUNTER — Emergency Department (HOSPITAL_COMMUNITY): Payer: Medicare HMO

## 2017-09-11 ENCOUNTER — Telehealth (HOSPITAL_COMMUNITY): Payer: Self-pay | Admitting: *Deleted

## 2017-09-11 DIAGNOSIS — I4891 Unspecified atrial fibrillation: Secondary | ICD-10-CM | POA: Diagnosis not present

## 2017-09-11 DIAGNOSIS — I10 Essential (primary) hypertension: Secondary | ICD-10-CM | POA: Insufficient documentation

## 2017-09-11 DIAGNOSIS — R079 Chest pain, unspecified: Secondary | ICD-10-CM | POA: Diagnosis not present

## 2017-09-11 DIAGNOSIS — Z7901 Long term (current) use of anticoagulants: Secondary | ICD-10-CM | POA: Insufficient documentation

## 2017-09-11 DIAGNOSIS — Z79899 Other long term (current) drug therapy: Secondary | ICD-10-CM | POA: Diagnosis not present

## 2017-09-11 LAB — CBC
HCT: 39.1 % (ref 36.0–46.0)
Hemoglobin: 12.5 g/dL (ref 12.0–15.0)
MCH: 29.6 pg (ref 26.0–34.0)
MCHC: 32 g/dL (ref 30.0–36.0)
MCV: 92.7 fL (ref 78.0–100.0)
Platelets: 160 10*3/uL (ref 150–400)
RBC: 4.22 MIL/uL (ref 3.87–5.11)
RDW: 13 % (ref 11.5–15.5)
WBC: 7 10*3/uL (ref 4.0–10.5)

## 2017-09-11 LAB — MAGNESIUM: Magnesium: 2 mg/dL (ref 1.7–2.4)

## 2017-09-11 LAB — BASIC METABOLIC PANEL
Anion gap: 9 (ref 5–15)
BUN: 11 mg/dL (ref 6–20)
CO2: 23 mmol/L (ref 22–32)
Calcium: 9.2 mg/dL (ref 8.9–10.3)
Chloride: 106 mmol/L (ref 101–111)
Creatinine, Ser: 0.75 mg/dL (ref 0.44–1.00)
GFR calc Af Amer: >60 mL/min (ref >60)
GFR calc non Af Amer: >60 mL/min (ref >60)
Glucose, Bld: 136 mg/dL — ABNORMAL HIGH (ref 65–99)
Potassium: 3.8 mmol/L (ref 3.5–5.1)
Sodium: 138 mmol/L (ref 135–145)

## 2017-09-11 LAB — TROPONIN I: Troponin I: <0.03 ng/mL (ref <0.03)

## 2017-09-11 MED ORDER — ETOMIDATE 2 MG/ML IV SOLN
10.0000 mg | Freq: Once | INTRAVENOUS | Status: DC
  Filled 2017-09-11: qty 10

## 2017-09-11 MED ORDER — DILTIAZEM HCL 25 MG/5ML IV SOLN
10.0000 mg | Freq: Once | INTRAVENOUS | Status: AC
  Administered 2017-09-11: 10 mg via INTRAVENOUS
  Filled 2017-09-11: qty 5

## 2017-09-11 MED ORDER — ETOMIDATE 2 MG/ML IV SOLN
INTRAVENOUS | Status: DC | PRN
  Administered 2017-09-11: 6 mg via INTRAVENOUS

## 2017-09-11 MED ORDER — ONDANSETRON HCL 4 MG/2ML IJ SOLN
4.0000 mg | Freq: Once | INTRAMUSCULAR | Status: AC
  Administered 2017-09-11: 4 mg via INTRAVENOUS
  Filled 2017-09-11: qty 2

## 2017-09-11 NOTE — Sedation Documentation (Signed)
200J shock delivered

## 2017-09-11 NOTE — Sedation Documentation (Signed)
Pt alert, verbal, nad

## 2017-09-11 NOTE — ED Notes (Signed)
Portable xray at bedside.

## 2017-09-11 NOTE — ED Provider Notes (Signed)
[facility]-EMERGENCY DEPT Provider Note   CSN: [id number] Arrival date & time: 09/11/17  0020     History   Chief Complaint Chief Complaint  Patient presents with  . Atrial Fibrillation    HPI Amanda Olsen is a 83 y.o. female.  The history is provided by the patient and medical records. No language interpreter was used.  Atrial Fibrillation  Associated symptoms include chest pain.   Amanda Olsen is a 83 y.o. female  with a PMH of afib on eliquis who presents to the Emergency Department complaining of not feeling well beginning acutely at 9 pm tonight. She states that her chest starting feeling uncomfortable and she felt weak. EMS was called and patient found to be in afib. No medications prior to arrival for symptoms. She did take her eliquis tonight and denies any missed doses in the last month. No shortness of breath, n/v/d, back pain or abdominal pain.   Past Medical History:  Diagnosis Date  . Atrial fibrillation (HCC)    maintained in sinus on Flecainide  . Cataract   . ELECTROCARDIOGRAM, ABNORMAL   . HTN (hypertension)   . Rosacea     Patient Active Problem List   Diagnosis Date Noted  . Visit for monitoring Tikosyn therapy 07/09/2017  . Hypothyroid 11/16/2011  . ELECTROCARDIOGRAM, ABNORMAL 05/02/2010  . ESSENTIAL HYPERTENSION, BENIGN 04/15/2009  . ROSACEA 04/15/2009  . ATRIAL FIBRILLATION 10/16/2008    Past Surgical History:  Procedure Laterality Date  . VARICOSE VEIN SURGERY      OB History    No data available       Home Medications    Prior to Admission medications   Medication Sig Start Date End Date Taking? Authorizing Provider  apixaban (ELIQUIS) 5 MG TABS tablet Take 1 tablet (5 mg total) by mouth 2 (two) times daily. 07/02/17   Newman Nip, NP  diltiazem (CARDIZEM) 30 MG tablet Take 1 tablet every 4 hours AS NEEDED for breakthrough afib heart rate >100 06/05/17   Newman Nip, NP  dofetilide (TIKOSYN) 250 MCG capsule Take 1  capsule (250 mcg total) by mouth 2 (two) times daily. 07/12/17   Sheilah Pigeon, PA-C  lisinopril (PRINIVIL,ZESTRIL) 20 MG tablet Take 1 tablet (20 mg total) by mouth daily. 03/29/17   Wendall Stade, MD  potassium chloride 20 MEQ TBCR Take 20 mEq by mouth daily. 07/12/17   Sheilah Pigeon, PA-C    Family History Family History  Problem Relation Age of Onset  . Heart Problems Mother     Social History Social History  Substance Use Topics  . Smoking status: Never Smoker  . Smokeless tobacco: Never Used  . Alcohol use No     Allergies   Patient has no known allergies.   Review of Systems Review of Systems  Cardiovascular: Positive for chest pain and palpitations. Negative for leg swelling.  Neurological: Positive for weakness.  All other systems reviewed and are negative.    Physical Exam Updated Vital Signs BP 117/63   Pulse (!) 58   Temp 98.1 F (36.7 C) (Oral)   Resp 14   Ht  (1.676 m)   Wt 63.5 kg (140 lb)   SpO2 99%   BMI 22.60 kg/m   Physical Exam  Constitutional: She is oriented to person, place, and time. She appears well-developed and well-nourished. No distress.  HENT:  Head: Normocephalic and atraumatic.  Cardiovascular: An irregularly irregular rhythm present. Tachycardia present.   No murmur  heard. Pulmonary/Chest: Effort normal and breath sounds normal. No respiratory distress. She has no wheezes. She has no rales.  Abdominal: Soft. She exhibits no distension. There is no tenderness.  Musculoskeletal: She exhibits no edema.  Neurological: She is alert and oriented to person, place, and time.  Skin: Skin is warm and dry.  Nursing note and vitals reviewed.    ED Treatments / Results  Labs (all labs ordered are listed, but only abnormal results are displayed) Labs Reviewed  BASIC METABOLIC PANEL - Abnormal; Notable for the following:       Result Value   Glucose, Bld 136 (*)    All other components within normal limits  MAGNESIUM    CBC  TROPONIN I    EKG  EKG Interpretation  Date/Time:  Tuesday September 11 2017 02:14:38 EDT Ventricular Rate:  70 PR Interval:    QRS Duration: 100 QT Interval:  390 QTC Calculation: 421 R Axis:   13 Text Interpretation:  Normal sinus rhythm No longer in atrial fib T wave inversions improved from prior Confirmed by Ross Marcus ([id number]) on 09/11/2017 3:05:09 AM       Radiology Dg Chest Portable 1 View  Result Date: 09/11/2017 CLINICAL DATA:  Chest pain and tachycardia. Atrial fibrillation, on Eliquis. EXAM: PORTABLE CHEST 1 VIEW COMPARISON:  Chest radiograph May 21, 2017 FINDINGS: Cardiomediastinal silhouette is normal. Calcified aortic knob. Unchanged LEFT lung base scarring. No pleural effusions or focal consolidations. Trachea projects midline and there is no pneumothorax. Soft tissue planes and included osseous structures are non-suspicious. Single level mild midthoracic suspected compression fracture. Multiple pacer pads and wires overlie the chest. IMPRESSION: Scarring LEFT lung base without acute cardiopulmonary process. Aortic Atherosclerosis (ICD10-I70.0). Electronically Signed   By: Awilda Metro M.D.   On: 09/11/2017 02:15    Procedures .Cardioversion Date/Time: 09/11/2017 3:19 AM Performed by: Janyth Contes Authorized by: Janyth Contes   Consent:    Consent obtained:  Written and emergent situation   Consent given by:  Patient   Risks discussed:  Cutaneous burn, death, induced arrhythmia and pain Universal protocol:    Procedure explained and questions answered to patient or proxy's satisfaction: yes     Relevant documents present and verified: yes     Test results available and properly labeled: yes     Immediately prior to procedure a time out was called: yes     Patient identity confirmed:  Verbally with patient and arm band Pre-procedure details:    Cardioversion basis:  Emergent   Rhythm:  Atrial fibrillation   Electrode placement:   Anterior-posterior Attempt one:    Cardioversion mode:  Synchronous   Waveform:  Biphasic   Shock (Joules):  200   Shock outcome:  Conversion to normal sinus rhythm Post-procedure details:    Patient status:  Awake   Patient tolerance of procedure:  Tolerated well, no immediate complications   CRITICAL CARE Performed by: Chase Picket Ward   Total critical care time: 40 minutes  Critical care time was exclusive of separately billable procedures and treating other patients.  Critical care was necessary to treat or prevent imminent or life-threatening deterioration.  Critical care was time spent personally by me on the following activities: development of treatment plan with patient and/or surrogate as well as nursing, discussions with consultants, evaluation of patient's response to treatment, examination of patient, obtaining history from patient or surrogate, ordering and performing treatments and interventions, ordering and review of laboratory studies, ordering and review of radiographic  studies, pulse oximetry and re-evaluation of patient's condition.   (including critical care time)  Medications Ordered in ED Medications  etomidate (AMIDATE) injection 10 mg (not administered)  etomidate (AMIDATE) injection (6 mg Intravenous Given 09/11/17 0212)  diltiazem (CARDIZEM) injection 10 mg (10 mg Intravenous Given 09/11/17 0119)  ondansetron (ZOFRAN) injection 4 mg (4 mg Intravenous Given 09/11/17 0226)     Initial Impression / Assessment and Plan / ED Course  I have reviewed the triage vital signs and the nursing notes.  Pertinent labs & imaging results that were available during my care of the patient were reviewed by me and considered in my medical decision making (see chart for details).   This patients CHA2DS2-VASc Score and unadjusted Ischemic Stroke Rate (% per year) is equal to 4.8 % stroke rate/year from a score of 4   Amanda Olsen is a 83 y.o. female who presents to  ED for chest discomfort and weakness. Afib with RVR on exam. Cardizem given with mild improvement in HR but still not well controlled. Patient is on Eliquis with no missed doses and took medication tonight as well. Cardioversion performed under directed supervision of attending, Dr. Wilkie Aye. Patient tolerated procedure well and in NSR. She was observed in ED following procedure. She now feels improved with no complaints. She is followed by both cardiology and a. Fib clinic and will follow up as outpatient. Discussed specific return precautions. All questions answered.   Patient seen by and discussed with Dr. Wilkie Aye who agrees with treatment plan.       Final Clinical Impressions(s) / ED Diagnoses   Final diagnoses:  Atrial fibrillation with RVR [facility])    New Prescriptions Discharge Medication List as of 09/11/2017  4:19 AM       Ward, Chase Picket, PA-C 09/11/17 0500    Shon Baton, MD 09/11/17 [phone]

## 2017-09-11 NOTE — Sedation Documentation (Signed)
Pt c/o nausea.  

## 2017-09-11 NOTE — ED Provider Notes (Addendum)
Medical screening examination/treatment/procedure(s) were conducted as a shared visit with non-physician practitioner(s) and myself.  I personally evaluated the patient during the encounter.    Physical Exam  BP (!) 144/87 (BP Location: Right Arm)   Pulse 72   Temp 98.1 F (36.7 C) (Oral)   Resp 16   Ht 5\' 6"  (1.676 m)   Wt 63.5 kg (140 lb)   SpO2 99%   BMI 22.60 kg/m   Physical Exam  Constitutional: She appears well-developed and well-nourished.  Elderly, no acute distress  Cardiovascular:  No murmur heard. Tachycardia, irregular rhythm  Pulmonary/Chest: Effort normal and breath sounds normal. No respiratory distress.  Abdominal: Soft. She exhibits no distension.  Musculoskeletal: She exhibits no edema.  Nursing note and vitals reviewed.   ED Course  .Sedation Date/Time: 09/11/2017 3:00 AM Performed by: Shon Baton Authorized by: Shon Baton   Consent:    Consent obtained:  Written   Consent given by:  Patient   Risks discussed:  Allergic reaction, prolonged hypoxia resulting in organ damage, nausea and vomiting Indications:    Procedure performed:  Cardioversion   Procedure necessitating sedation performed by:  Physician performing sedation   Intended level of sedation:  Moderate (conscious sedation) Pre-sedation assessment:    Time since last food or drink:  6p   ASA classification: class 3 - patient with severe systemic disease     Neck mobility: normal     Mallampati score:  II - soft palate, uvula, fauces visible   Pre-sedation assessments completed and reviewed: airway patency not reviewed, mental status not reviewed, nausea/vomiting not reviewed and respiratory function not reviewed     Pre-sedation assessment completed:  09/11/2017 1:45 AM Immediate pre-procedure details:    Reassessment: Patient reassessed immediately prior to procedure     Reviewed: vital signs     Verified: bag valve mask available, emergency equipment available, intubation  equipment available and oxygen available   Procedure details (see MAR for exact dosages):    Preoxygenation:  Nasal cannula   Sedation:  Etomidate   Intra-procedure monitoring:  Blood pressure monitoring, continuous capnometry, continuous pulse oximetry and cardiac monitor   Intra-procedure events: respiratory depression     Intra-procedure management:  Airway repositioning and BVM ventilation   Total Provider sedation time (minutes):  20 Post-procedure details:    Post-sedation assessment completed:  09/11/2017 2:30 AM   Attendance: Constant attendance by certified staff until patient recovered     Recovery: Patient returned to pre-procedure baseline     Post-sedation assessments completed and reviewed: airway patency not reviewed, cardiovascular function not reviewed, mental status not reviewed and nausea/vomiting not reviewed     Patient is stable for discharge or admission: yes     Patient tolerance:  Tolerated well, no immediate complications   CRITICAL CARE Performed by: Shon Baton   Total critical care time: 40 minutes  Critical care time was exclusive of separately billable procedures and treating other patients.  Critical care was necessary to treat or prevent imminent or life-threatening deterioration.  Critical care was time spent personally by me on the following activities: development of treatment plan with patient and/or surrogate as well as nursing, discussions with consultants, evaluation of patient's response to treatment, examination of patient, obtaining history from patient or surrogate, ordering and performing treatments and interventions, ordering and review of laboratory studies, ordering and review of radiographic studies, pulse oximetry and re-evaluation of patient's condition.   MDM Patient presents with atrial fibrillation with RVR. Currently  on Eliquis and Tikosyn.  Reports fluttering in chest discomfort that started at 9 PM. She is otherwise  nontoxic-appearing. She is a good candidate for cardioversion. She was given 6 mg of etomidate. She had a brief episode of apnea post cardioversion but recovered without any immediate complications. Repeat EKG is normal sinus rhythm and patient states she feels much comfortable.   EKG Interpretation  Date/Time:  Tuesday September 11 2017 00:56:15 EDT Ventricular Rate:  157 PR Interval:    QRS Duration: 122 QT Interval:  298 QTC Calculation: 481 R Axis:   13 Text Interpretation:  Atrial fibrillation with rapid ventricular response Left bundle branch block Abnormal ECG Confirmed by Ross Marcus ([id number]) on 09/11/2017 1:01:50 AM       EKG Interpretation  Date/Time:  Tuesday September 11 2017 02:14:38 EDT Ventricular Rate:  70 PR Interval:    QRS Duration: 100 QT Interval:  390 QTC Calculation: 421 R Axis:   13 Text Interpretation:  Normal sinus rhythm No longer in atrial fib T wave inversions improved from prior Confirmed by Ross Marcus ([id number]) on 09/11/2017 3:05:09 AM             Wilkie Aye, Mayer Masker, MD 09/11/17 0981    Shon Baton, MD 09/11/17 [phone]

## 2017-09-11 NOTE — Sedation Documentation (Signed)
Respiratory providing respirations with ambubag due to pts respiration rate decreasing

## 2017-09-11 NOTE — Telephone Encounter (Signed)
Pt on ED discharge report.  Pt cardioverted in the ED. LMOM for pt to clbk to schedule follow up appt

## 2017-09-11 NOTE — ED Notes (Signed)
Patient from home, pt states that around 9pm last night she noticed chest pain, per EMS pt in rapid a-fib, pt is on eliquis and HR has been in 130's-200's. Pt denies N/V/D

## 2017-09-11 NOTE — Discharge Instructions (Signed)
It was my pleasure taking care of you today!    Please follow up with your cardiologist and the A. Fib clinic.   Return to ER for chest pain, trouble breathing, new or worsening symptoms, any additional concerns.

## 2017-09-18 ENCOUNTER — Ambulatory Visit (HOSPITAL_COMMUNITY)
Admission: RE | Admit: 2017-09-18 | Discharge: 2017-09-18 | Disposition: A | Discharge status: Home / Self Care | Payer: Medicare HMO | Source: Ambulatory Visit | Attending: Nurse Practitioner | Admitting: Nurse Practitioner

## 2017-09-18 ENCOUNTER — Encounter (HOSPITAL_COMMUNITY): Payer: Self-pay | Admitting: Nurse Practitioner

## 2017-09-18 VITALS — BP 118/64 | HR 63 | Ht 66.0 in | Wt 135.4 lb

## 2017-09-18 DIAGNOSIS — I480 Paroxysmal atrial fibrillation: Secondary | ICD-10-CM

## 2017-09-18 DIAGNOSIS — L719 Rosacea, unspecified: Secondary | ICD-10-CM | POA: Insufficient documentation

## 2017-09-18 DIAGNOSIS — I10 Essential (primary) hypertension: Secondary | ICD-10-CM | POA: Insufficient documentation

## 2017-09-18 DIAGNOSIS — Z7901 Long term (current) use of anticoagulants: Secondary | ICD-10-CM | POA: Insufficient documentation

## 2017-09-18 DIAGNOSIS — I4891 Unspecified atrial fibrillation: Secondary | ICD-10-CM | POA: Diagnosis not present

## 2017-09-18 DIAGNOSIS — Z79899 Other long term (current) drug therapy: Secondary | ICD-10-CM | POA: Diagnosis not present

## 2017-09-18 DIAGNOSIS — Z9889 Other specified postprocedural states: Secondary | ICD-10-CM | POA: Diagnosis not present

## 2017-09-18 NOTE — Progress Notes (Signed)
Primary Care Physician: Thayer Headings, MD Referring Physician: [facility] ER f/u Cardiologist: Dr. Duanne Moron is a 83 y.o. female with a h/o paroxysmal afib in the afib clinic for f/u ER visit for afib where she was successfully cardioverted. She has failed flecainide in the past, was hospitalized for Tikosyn in July and had done well until this sudden breakthrough afib. It happened suddenly, no known trigger around 9pm that night and by midnight pt was afraid to go to sleep living by herself and went to the ER. No further afib. Takes her Tikosyn on a regular basis, no missed doses, same for eliquis.  Today, she denies symptoms of palpitations, chest pain, shortness of breath, orthopnea, PND, lower extremity edema, dizziness, presyncope, syncope, or neurologic sequela. The patient is tolerating medications without difficulties and is otherwise without complaint today.   Past Medical History:  Diagnosis Date  . Atrial fibrillation (HCC)    maintained in sinus on Flecainide  . Cataract   . ELECTROCARDIOGRAM, ABNORMAL   . HTN (hypertension)   . Rosacea    Past Surgical History:  Procedure Laterality Date  . VARICOSE VEIN SURGERY      Current Outpatient Prescriptions  Medication Sig Dispense Refill  . apixaban (ELIQUIS) 5 MG TABS tablet Take 1 tablet (5 mg total) by mouth 2 (two) times daily. 60 tablet 6  . dofetilide (TIKOSYN) 250 MCG capsule Take 1 capsule (250 mcg total) by mouth 2 (two) times daily. 60 capsule 6  . lisinopril (PRINIVIL,ZESTRIL) 20 MG tablet Take 1 tablet (20 mg total) by mouth daily. 90 tablet 3  . potassium chloride 20 MEQ TBCR Take 20 mEq by mouth daily. 30 tablet 3  . diltiazem (CARDIZEM) 30 MG tablet Take 1 tablet every 4 hours AS NEEDED for breakthrough afib heart rate >100 (Patient not taking: Reported on 09/18/2017) 45 tablet 1   No current facility-administered medications for this encounter.     No Known Allergies  Social History   Social  History  . Marital status: Widowed    Spouse name: N/A  . Number of children: N/A  . Years of education: N/A   Occupational History  . Not on file.   Social History Main Topics  . Smoking status: Never Smoker  . Smokeless tobacco: Never Used  . Alcohol use No  . Drug use: No  . Sexual activity: Not Currently   Other Topics Concern  . Not on file   Social History Narrative  . No narrative on file    Family History  Problem Relation Age of Onset  . Heart Problems Mother     ROS- All systems are reviewed and negative except as per the HPI above  Physical Exam: Vitals:   09/18/17 1058  BP: 118/64  Pulse: 63  Weight: 135 lb 6.4 oz (61.4 kg)  Height: 5\' 6"  (1.676 m)   Wt Readings from Last 3 Encounters:  09/18/17 135 lb 6.4 oz (61.4 kg)  09/11/17 140 lb (63.5 kg)  08/14/17 136 lb 6.4 oz (61.9 kg)    Labs: Lab Results  Component Value Date   NA 138 09/11/2017   K 3.8 09/11/2017   CL 106 09/11/2017   CO2 23 09/11/2017   GLUCOSE 136 (H) 09/11/2017   BUN 11 09/11/2017   CREATININE 0.75 09/11/2017   CALCIUM 9.2 09/11/2017   MG 2.0 09/11/2017   Lab Results  Component Value Date   INR 4.4 (H) 05/03/2009   Lab Results  Component Value Date   CHOL  07/02/2008    130        ATP III CLASSIFICATION:  <200     mg/dL   Desirable  161-096  mg/dL   Borderline High  >=045    mg/dL   High   HDL 42 40/98/1191   LDLCALC  07/02/2008    71        Total Cholesterol/HDL:CHD Risk Coronary Heart Disease Risk Table                     Men   Women  1/2 Average Risk   3.4   3.3   TRIG 87 07/02/2008     GEN- The patient is well appearing, alert and oriented x 3 today.   Head- normocephalic, atraumatic Eyes-  Sclera clear, conjunctiva pink Ears- hearing intact Oropharynx- clear Neck- supple, no JVP Lymph- no cervical lymphadenopathy Lungs- Clear to ausculation bilaterally, normal work of breathing Heart- Regular rate and rhythm, no murmurs, rubs or gallops, PMI not  laterally displaced GI- soft, NT, ND, + BS Extremities- no clubbing, cyanosis, or edema MS- no significant deformity or atrophy Skin- no rash or lesion Psych- euthymic mood, full affect Neuro- strength and sensation are intact  EKG-SR at 63 bpm, pr int 180 ms, qrs int 84 ms, qtc 466 ms Epic records reviewed Mag/K+ checked 10/2     Assessment and Plan: 1. Afib  Successfully cardioverted in ER Reassured Continue  Tikosyn and Cardizem Continue apixaban for a chadsvasc score of at least 5 Continue k+ 20 meq q day  2. LV dysfunction Pending f/u echo 11/5 F/u with Dr Eden Emms as scheduled 11/19  Afib clinic in 4 months  Lupita Leash [facility] [address] [place], [place] [address] [phone]

## 2017-10-15 ENCOUNTER — Other Ambulatory Visit: Payer: Self-pay | Admitting: Physician Assistant

## 2017-10-15 ENCOUNTER — Ambulatory Visit (HOSPITAL_COMMUNITY): Payer: Medicare HMO | Attending: Cardiology

## 2017-10-15 ENCOUNTER — Other Ambulatory Visit: Payer: Self-pay

## 2017-10-15 DIAGNOSIS — I480 Paroxysmal atrial fibrillation: Secondary | ICD-10-CM

## 2017-10-15 DIAGNOSIS — I340 Nonrheumatic mitral (valve) insufficiency: Secondary | ICD-10-CM | POA: Diagnosis not present

## 2017-10-15 DIAGNOSIS — I447 Left bundle-branch block, unspecified: Secondary | ICD-10-CM | POA: Diagnosis not present

## 2017-10-15 DIAGNOSIS — I119 Hypertensive heart disease without heart failure: Secondary | ICD-10-CM | POA: Diagnosis not present

## 2017-10-24 NOTE — Progress Notes (Signed)
Patient ID: Amanda Olsen, female   DOB: 09/07/1934, 83 y.o.   MRN: [id number]   Bree is seen today for F/U of HTN and PAF. She had LBBB with flecainide and is on tikosyn. Mostly followed by Dr Elberta Fortis and afib clinic of late.  Seen in ER 09/11/17 for sudden onset palpitations with rapid afib. Cardioverted successfully in ER and was in NSR when f/u in afib clinic 09/18/17      She has good LV function and no CAD by cath. BP better controlled on ACE . She has a component of white coat hypertension. Has son and daughter in law with two grandchildren in [place] but misses [place] where she is from. Does not drive  She is scared about tikosyn cause it's new   ROS: Denies fever, malais, weight loss, blurry vision, decreased visual acuity, cough, sputum, SOB, hemoptysis, pleuritic pain, palpitaitons, heartburn, abdominal pain, melena, lower extremity edema, claudication, or rash.  All other systems reviewed and negative  General: BP 136/88   Pulse 76   Ht 5\' 6"  (1.676 m)   Wt 136 lb (61.7 kg)   SpO2 97%   BMI 21.95 kg/m  Affect appropriate Elderly [place] female  HEENT: normal Neck supple with no adenopathy JVP normal no bruits no thyromegaly Lungs clear with no wheezing and good diaphragmatic motion Heart:  S1/S2 no murmur, no rub, gallop or click PMI normal Abdomen: benighn, BS positve, no tenderness, no AAA no bruit.  No HSM or HJR Distal pulses intact with no bruits No edema Neuro non-focal Skin warm and dry No muscular weakness    Lab Results  Component Value Date   CREATININE 0.75 09/11/2017   BUN 11 09/11/2017   NA 138 09/11/2017   K 3.8 09/11/2017   CL 106 09/11/2017   CO2 23 09/11/2017     Current Outpatient Medications  Medication Sig Dispense Refill  . apixaban (ELIQUIS) 5 MG TABS tablet Take 1 tablet (5 mg total) by mouth 2 (two) times daily. 60 tablet 6  . dofetilide (TIKOSYN) 250 MCG capsule Take 1 capsule (250 mcg total) by mouth 2 (two)  times daily. 60 capsule 6  . lisinopril (PRINIVIL,ZESTRIL) 20 MG tablet Take 1 tablet (20 mg total) by mouth daily. 90 tablet 3  . potassium chloride 20 MEQ TBCR Take 20 mEq by mouth daily. 30 tablet 3   No current facility-administered medications for this visit.     Allergies  Patient has no known allergies.  Electrocardiogram: 09/15/17  Afib rate 156 LBBB  09/18/17 post [facility] SR PAC;s LVH with strain   Assessment and Plan  HTN:  Improved continue current meds  LBBB:  Stable no high grade AV block yearly ECG  PAF  CHA2VASC 4 on eliquis one break through on tikosyn. QT ok f/u afib clinic and Dr Elberta Fortis .   Ortho:  L45 disc issues on MRI going to PT  F/U Dr Shon Baton for steroid injection if not better will Need to hold xarelto for 2 days before injection   F/U with me in 6 months  Charlton Haws

## 2017-10-29 ENCOUNTER — Ambulatory Visit (INDEPENDENT_AMBULATORY_CARE_PROVIDER_SITE_OTHER): Payer: Medicare HMO | Admitting: Cardiovascular Disease

## 2017-10-29 ENCOUNTER — Encounter: Payer: Self-pay | Admitting: Cardiovascular Disease

## 2017-10-29 VITALS — BP 136/88 | HR 76 | Ht 66.0 in | Wt 136.0 lb

## 2017-10-29 DIAGNOSIS — I10 Essential (primary) hypertension: Secondary | ICD-10-CM

## 2017-10-29 NOTE — Patient Instructions (Signed)
Your physician wants you to follow-up in: 6 months with Dr. Nishan. You will receive a reminder letter in the mail two months in advance. If you don't receive a letter, please call our office to schedule the follow-up appointment.  

## 2017-11-08 ENCOUNTER — Other Ambulatory Visit: Payer: Self-pay | Admitting: Cardiovascular Disease

## 2017-11-08 MED ORDER — POTASSIUM CHLORIDE ER 20 MEQ PO TBCR: 20.0000 meq | EXTENDED_RELEASE_TABLET | Freq: Every day | ORAL | 11 refills | Status: DC

## 2017-12-21 ENCOUNTER — Ambulatory Visit (HOSPITAL_COMMUNITY)
Admission: RE | Admit: 2017-12-21 | Discharge: 2017-12-21 | Disposition: A | Discharge status: Home / Self Care | Payer: Medicare HMO | Source: Ambulatory Visit | Attending: Nurse Practitioner | Admitting: Nurse Practitioner

## 2017-12-21 VITALS — BP 142/84 | HR 63 | Ht 66.0 in | Wt 139.0 lb

## 2017-12-21 DIAGNOSIS — I4891 Unspecified atrial fibrillation: Secondary | ICD-10-CM | POA: Diagnosis not present

## 2017-12-21 DIAGNOSIS — I10 Essential (primary) hypertension: Secondary | ICD-10-CM | POA: Insufficient documentation

## 2017-12-21 DIAGNOSIS — I501 Left ventricular failure: Secondary | ICD-10-CM | POA: Insufficient documentation

## 2017-12-21 DIAGNOSIS — Z7902 Long term (current) use of antithrombotics/antiplatelets: Secondary | ICD-10-CM | POA: Diagnosis not present

## 2017-12-21 DIAGNOSIS — Z79899 Other long term (current) drug therapy: Secondary | ICD-10-CM | POA: Insufficient documentation

## 2017-12-21 DIAGNOSIS — L719 Rosacea, unspecified: Secondary | ICD-10-CM | POA: Insufficient documentation

## 2017-12-21 DIAGNOSIS — I480 Paroxysmal atrial fibrillation: Secondary | ICD-10-CM | POA: Diagnosis not present

## 2017-12-21 LAB — BASIC METABOLIC PANEL
Anion gap: 7 (ref 5–15)
BUN: 15 mg/dL (ref 6–20)
CO2: 28 mmol/L (ref 22–32)
Calcium: 9.6 mg/dL (ref 8.9–10.3)
Chloride: 103 mmol/L (ref 101–111)
Creatinine, Ser: 0.73 mg/dL (ref 0.44–1.00)
GFR calc Af Amer: >60 mL/min (ref >60)
GFR calc non Af Amer: >60 mL/min (ref >60)
Glucose, Bld: 94 mg/dL (ref 65–99)
Potassium: 4.6 mmol/L (ref 3.5–5.1)
Sodium: 138 mmol/L (ref 135–145)

## 2017-12-21 LAB — MAGNESIUM: Magnesium: 2.1 mg/dL (ref 1.7–2.4)

## 2017-12-21 NOTE — Progress Notes (Signed)
Primary Care Physician: Thayer Headings, MD Referring Physician: [facility] ER f/u Cardiologist: Dr. Duanne Moron is a 84 y.o. female with a h/o paroxysmal afib in the afib clinic for f/u  tikosyn use. No breakthrough afib. She feels well.  Today, she denies symptoms of palpitations, chest pain, shortness of breath, orthopnea, PND, lower extremity edema, dizziness, presyncope, syncope, or neurologic sequela. The patient is tolerating medications without difficulties and is otherwise without complaint today.   Past Medical History:  Diagnosis Date  . Atrial fibrillation (HCC)    maintained in sinus on Flecainide  . Cataract   . ELECTROCARDIOGRAM, ABNORMAL   . HTN (hypertension)   . Rosacea    Past Surgical History:  Procedure Laterality Date  . VARICOSE VEIN SURGERY      Current Outpatient Medications  Medication Sig Dispense Refill  . apixaban (ELIQUIS) 5 MG TABS tablet Take 1 tablet (5 mg total) by mouth 2 (two) times daily. 60 tablet 6  . cholecalciferol (VITAMIN D) 1000 units tablet Take 1,000 Units by mouth daily.    Marland Kitchen dofetilide (TIKOSYN) 250 MCG capsule Take 1 capsule (250 mcg total) by mouth 2 (two) times daily. 60 capsule 6  . lisinopril (PRINIVIL,ZESTRIL) 20 MG tablet Take 1 tablet (20 mg total) by mouth daily. 90 tablet 3  . Magnesium 250 MG TABS Take 1 tablet by mouth daily.    . Potassium Chloride ER 20 MEQ TBCR Take 20 mEq by mouth daily. 30 tablet 11  . vitamin B-12 (CYANOCOBALAMIN) 100 MCG tablet Take 100 mcg by mouth daily.     No current facility-administered medications for this encounter.     No Known Allergies  Social History   Socioeconomic History  . Marital status: Widowed    Spouse name: Not on file  . Number of children: Not on file  . Years of education: Not on file  . Highest education level: Not on file  Social Needs  . Financial resource strain: Not on file  . Food insecurity - worry: Not on file  . Food insecurity - inability:  Not on file  . Transportation needs - medical: Not on file  . Transportation needs - non-medical: Not on file  Occupational History  . Not on file  Tobacco Use  . Smoking status: Never Smoker  . Smokeless tobacco: Never Used  Substance and Sexual Activity  . Alcohol use: No  . Drug use: No  . Sexual activity: Not Currently  Other Topics Concern  . Not on file  Social History Narrative  . Not on file    Family History  Problem Relation Age of Onset  . Heart Problems Mother     ROS- All systems are reviewed and negative except as per the HPI above  Physical Exam: Vitals:   12/21/17 1059  BP: (!) 142/84  Pulse: 63  Weight: 139 lb (63 kg)  Height: 5\' 6"  (1.676 m)   Wt Readings from Last 3 Encounters:  12/21/17 139 lb (63 kg)  10/29/17 136 lb (61.7 kg)  09/18/17 135 lb 6.4 oz (61.4 kg)    Labs: Lab Results  Component Value Date   NA 138 09/11/2017   K 3.8 09/11/2017   CL 106 09/11/2017   CO2 23 09/11/2017   GLUCOSE 136 (H) 09/11/2017   BUN 11 09/11/2017   CREATININE 0.75 09/11/2017   CALCIUM 9.2 09/11/2017   MG 2.0 09/11/2017   Lab Results  Component Value Date   INR 4.4 (  H) 05/03/2009   Lab Results  Component Value Date   CHOL  07/02/2008    130        ATP III CLASSIFICATION:  <200     mg/dL   Desirable  161-096  mg/dL   Borderline High  >=045    mg/dL   High   HDL 42 40/98/1191   LDLCALC  07/02/2008    71        Total Cholesterol/HDL:CHD Risk Coronary Heart Disease Risk Table                     Men   Women  1/2 Average Risk   3.4   3.3   TRIG 87 07/02/2008     GEN- The patient is well appearing, alert and oriented x 3 today.   Head- normocephalic, atraumatic Eyes-  Sclera clear, conjunctiva pink Ears- hearing intact Oropharynx- clear Neck- supple, no JVP Lymph- no cervical lymphadenopathy Lungs- Clear to ausculation bilaterally, normal work of breathing Heart- Regular rate and rhythm, no murmurs, rubs or gallops, PMI not laterally  displaced GI- soft, NT, ND, + BS Extremities- no clubbing, cyanosis, or edema MS- no significant deformity or atrophy Skin- no rash or lesion Psych- euthymic mood, full affect Neuro- strength and sensation are intact  EKG-SR at 63 bpm, pr int 180 ms, qrs int 88 ms, qtc 433 ms Epic records reviewed Mag/K+ to be drawn today    Assessment and Plan: 1. Afib  Staying in SR Doing well on tikosyn Continue Tikosyn and Cardizem Continue apixaban for a chadsvasc score of at least 5 bmet/mag  2. LV dysfunction f/u echo 11/5 EF returned to normal  Afib clinic in 3 months  Lupita Leash [facility] [address] [place], [place] [address] [phone]

## 2018-01-29 ENCOUNTER — Other Ambulatory Visit (HOSPITAL_COMMUNITY): Payer: Self-pay | Admitting: Nurse Practitioner

## 2018-02-13 ENCOUNTER — Other Ambulatory Visit (HOSPITAL_COMMUNITY): Payer: Self-pay | Admitting: *Deleted

## 2018-02-13 MED ORDER — DOFETILIDE 250 MCG PO CAPS: 250.0000 ug | ORAL_CAPSULE | Freq: Two times a day (BID) | ORAL | 6 refills | Status: DC

## 2018-02-13 NOTE — Progress Notes (Signed)
Electrophysiology Office Note   Date:  02/15/2018   ID:  Amanda Olsen, DOB 1933/12/19, MRN [id number]  PCP:  Amanda Headings, MD  Cardiologist:  Amanda Olsen Primary Electrophysiologist:  Amanda Krukowski Jorja Loa, MD    Chief Complaint  Patient presents with  . Follow-up    PAF     History of Present Illness: Amanda Olsen is a 84 y.o. female who is being seen today for the evaluation of atrial fibrillation at the request of Amanda Headings, MD. Presenting today for electrophysiology evaluation. History of paroxysmal atrial fibrillation, HTN. Had tikosyn load 7/30 after having LBBB on flecainide which has since resolved.   Today, denies symptoms of palpitations, chest pain, shortness of breath, orthopnea, PND, lower extremity edema, claudication, dizziness, presyncope, syncope, bleeding, or neurologic sequela. The patient is tolerating medications without difficulties.  She did have an episode where she felt like she was in atrial fibrillation in January.  This episode lasted 5 hours and she converted on her own.  She felt palpitations and shortness of breath.  She has had no further episodes.   Past Medical History:  Diagnosis Date  . Atrial fibrillation (HCC)    maintained in sinus on Flecainide  . Cataract   . ELECTROCARDIOGRAM, ABNORMAL   . HTN (hypertension)   . Rosacea    Past Surgical History:  Procedure Laterality Date  . VARICOSE VEIN SURGERY       Current Outpatient Medications  Medication Sig Dispense Refill  . cholecalciferol (VITAMIN D) 1000 units tablet Take 1,000 Units by mouth daily.    Marland Kitchen dofetilide (TIKOSYN) 250 MCG capsule Take 1 capsule (250 mcg total) by mouth 2 (two) times daily. 60 capsule 6  . ELIQUIS 5 MG TABS tablet TAKE 1 TABLET BY MOUTH TWICE DAILY 60 tablet 5  . lisinopril (PRINIVIL,ZESTRIL) 20 MG tablet Take 1 tablet (20 mg total) by mouth daily. 90 tablet 3  . Magnesium 250 MG TABS Take 1 tablet by mouth daily.    . Potassium Chloride ER 20  MEQ TBCR Take 20 mEq by mouth daily. 30 tablet 11  . vitamin B-12 (CYANOCOBALAMIN) 100 MCG tablet Take 100 mcg by mouth daily.     No current facility-administered medications for this visit.     Allergies:   Patient has no known allergies.   Social History:  The patient  reports that  has never smoked. she has never used smokeless tobacco. She reports that she does not drink alcohol or use drugs.   Family History:  The patient's family history includes Heart Problems in her mother.    ROS:  Please see the history of present illness.   Otherwise, review of systems is positive for none.   All other systems are reviewed and negative.   PHYSICAL EXAM: VS:  BP (!) 154/88   Pulse 70   Ht 5\' 6"  (1.676 m)   Wt 143 lb 6.4 oz (65 kg)   BMI 23.15 kg/m  , BMI Body mass index is 23.15 kg/m. GEN: Well nourished, well developed, in no acute distress  HEENT: normal  Neck: no JVD, carotid bruits, or masses Cardiac: RRR; no murmurs, rubs, or gallops,no edema  Respiratory:  clear to auscultation bilaterally, normal work of breathing GI: soft, nontender, nondistended, + BS MS: no deformity or atrophy  Skin: warm and dry Neuro:  Strength and sensation are intact Psych: euthymic mood, full affect  EKG:  EKG is not ordered today. Personal review of the ekg ordered 12/21/17 shows  sinus rhythm, rate 61, QTc 433   Recent Labs: 03/27/2017: ALT 26; B Natriuretic Peptide 161.2 06/25/2017: TSH 4.333 09/11/2017: Hemoglobin 12.5; Platelets 160 12/21/2017: BUN 15; Creatinine, Ser 0.73; Magnesium 2.1; Potassium 4.6; Sodium 138    Lipid Panel     Component Value Date/Time   CHOL  07/02/2008 0425    130        ATP III CLASSIFICATION:  <200     mg/dL   Desirable  161-096  mg/dL   Borderline High  >=045    mg/dL   High   TRIG 87 40/98/1191 0425   HDL 42 07/02/2008 0425   CHOLHDL 3.1 07/02/2008 0425   VLDL 17 07/02/2008 0425   LDLCALC  07/02/2008 0425    71        Total Cholesterol/HDL:CHD  Risk Coronary Heart Disease Risk Table                     Men   Women  1/2 Average Risk   3.4   3.3     Wt Readings from Last 3 Encounters:  02/15/18 143 lb 6.4 oz (65 kg)  12/21/17 139 lb (63 kg)  10/29/17 136 lb (61.7 kg)      Other studies Reviewed: Additional studies/ records that were reviewed today include: TTE 07/02/17  Review of the above records today demonstrates:  - Left ventricle: Systolic function was mildly to moderately   reduced. The estimated ejection fraction was in the range of 40%   to 45%. Features are consistent with a pseudonormal left   ventricular filling pattern, with concomitant abnormal relaxation   and increased filling pressure (grade 2 diastolic dysfunction). - Ventricular septum: Septal motion showed paradox. These changes   are consistent with a left bundle branch block. - Left atrium: The atrium was mildly dilated. - Pulmonary arteries: Systolic pressure was mildly increased. PA   peak pressure: 39 mm Hg (S).   ASSESSMENT AND PLAN:  1.  Paroxysmal atrial fibrillation: On Eliquis and dofetilide.  She did have an episode where she feels like she was in atrial fibrillation in January.  None since.  No changes.  This patients CHA2DS2-VASc Score and unadjusted Ischemic Stroke Rate (% per year) is equal to 4.8 % stroke rate/year from a score of 4  Above score calculated as 1 point each if present [CHF, HTN, DM, Vascular=MI/PAD/Aortic Plaque, Age if 65-74, or Female] Above score calculated as 2 points each if present [Age > 75, or Stroke/TIA/TE]   2. Hypertension: Dated today but has been well controlled in the past and is well controlled at home.  No changes.  Current medicines are reviewed at length with the patient today.   The patient does not have concerns regarding her medicines.  The following changes were made today: None  Labs/ tests ordered today include:  No orders of the defined types were placed in this  encounter.    Disposition:   FU with Amanda Olsen 6 months  Signed, Amanda Malecha Jorja Loa, MD  02/15/2018 10:57 AM     [facility] [address] [place] [address] [phone] (office) [phone] (fax)

## 2018-02-15 ENCOUNTER — Encounter: Payer: Self-pay | Admitting: Cardiology

## 2018-02-15 ENCOUNTER — Ambulatory Visit (INDEPENDENT_AMBULATORY_CARE_PROVIDER_SITE_OTHER): Payer: Medicare HMO | Admitting: Cardiology

## 2018-02-15 VITALS — BP 154/88 | HR 70 | Ht 66.0 in | Wt 143.4 lb

## 2018-02-15 DIAGNOSIS — I480 Paroxysmal atrial fibrillation: Secondary | ICD-10-CM

## 2018-02-15 DIAGNOSIS — I10 Essential (primary) hypertension: Secondary | ICD-10-CM | POA: Diagnosis not present

## 2018-02-15 NOTE — Patient Instructions (Signed)
Medication Instructions:  Your physician recommends that you continue on your current medications as directed. Please refer to the Current Medication list given to you today.  * If you need a refill on your cardiac medications before your next appointment, please call your pharmacy.   Labwork: None ordered  Testing/Procedures: None ordered  Follow-Up: Your physician wants you to follow-up in: 1 year with Dr. Camnitz.  You will receive a reminder letter in the mail two months in advance. If you don't receive a letter, please call our office to schedule the follow-up appointment.  Thank you for choosing [facility]!!   Sherri Price, RN [phone]        

## 2018-03-22 ENCOUNTER — Encounter (HOSPITAL_COMMUNITY): Payer: Self-pay | Admitting: Nurse Practitioner

## 2018-03-22 ENCOUNTER — Ambulatory Visit (HOSPITAL_COMMUNITY)
Admission: RE | Admit: 2018-03-22 | Discharge: 2018-03-22 | Disposition: A | Discharge status: Home / Self Care | Payer: Medicare HMO | Source: Ambulatory Visit | Attending: Nurse Practitioner | Admitting: Nurse Practitioner

## 2018-03-22 VITALS — BP 144/82 | HR 62 | Ht 66.0 in | Wt 140.2 lb

## 2018-03-22 DIAGNOSIS — R9431 Abnormal electrocardiogram [ECG] [EKG]: Secondary | ICD-10-CM | POA: Diagnosis not present

## 2018-03-22 DIAGNOSIS — Z7901 Long term (current) use of anticoagulants: Secondary | ICD-10-CM | POA: Insufficient documentation

## 2018-03-22 DIAGNOSIS — I480 Paroxysmal atrial fibrillation: Secondary | ICD-10-CM | POA: Diagnosis not present

## 2018-03-22 DIAGNOSIS — L719 Rosacea, unspecified: Secondary | ICD-10-CM | POA: Insufficient documentation

## 2018-03-22 DIAGNOSIS — I4891 Unspecified atrial fibrillation: Secondary | ICD-10-CM | POA: Diagnosis present

## 2018-03-22 DIAGNOSIS — I119 Hypertensive heart disease without heart failure: Secondary | ICD-10-CM | POA: Diagnosis not present

## 2018-03-22 DIAGNOSIS — Z79899 Other long term (current) drug therapy: Secondary | ICD-10-CM | POA: Insufficient documentation

## 2018-03-22 LAB — BASIC METABOLIC PANEL
Anion gap: 7 (ref 5–15)
BUN: 13 mg/dL (ref 6–20)
CO2: 24 mmol/L (ref 22–32)
Calcium: 9.5 mg/dL (ref 8.9–10.3)
Chloride: 109 mmol/L (ref 101–111)
Creatinine, Ser: 0.82 mg/dL (ref 0.44–1.00)
GFR calc Af Amer: >60 mL/min (ref >60)
GFR calc non Af Amer: >60 mL/min (ref >60)
Glucose, Bld: 94 mg/dL (ref 65–99)
Potassium: 4.3 mmol/L (ref 3.5–5.1)
Sodium: 140 mmol/L (ref 135–145)

## 2018-03-22 LAB — MAGNESIUM: Magnesium: 2 mg/dL (ref 1.7–2.4)

## 2018-03-22 MED ORDER — DILTIAZEM HCL 30 MG PO TABS: ORAL_TABLET | ORAL | 1 refills | Status: DC

## 2018-03-22 NOTE — Progress Notes (Signed)
Primary Care Physician: Thayer Headings, MD Referring Physician: [facility] ER f/u Cardiologist: Dr.  [name] is a 84 y.o. female with a h/o paroxysmal afib in the afib clinic for f/u, on Tikosyn.   She has had 2 episodes of afib since I saw her last. The first episode she called EMS but when she finished call to them, afib resolved. Another time it last 12 hours but she felt OK in it and rode it out. Still in taking Tikosyn on a regular basis. No bleeding issues with eliquis, taking 2x a day.  Today, she denies symptoms of palpitations, chest pain, shortness of breath, orthopnea, PND, lower extremity edema, dizziness, presyncope, syncope, or neurologic sequela. The patient is tolerating medications without difficulties and is otherwise without complaint today.   Past Medical History:  Diagnosis Date  . Atrial fibrillation (HCC)    maintained in sinus on Flecainide  . Cataract   . ELECTROCARDIOGRAM, ABNORMAL   . HTN (hypertension)   . Rosacea    Past Surgical History:  Procedure Laterality Date  . VARICOSE VEIN SURGERY      Current Outpatient Medications  Medication Sig Dispense Refill  . cholecalciferol (VITAMIN D) 1000 units tablet Take 1,000 Units by mouth daily.    Marland Kitchen dofetilide (TIKOSYN) 250 MCG capsule Take 1 capsule (250 mcg total) by mouth 2 (two) times daily. 60 capsule 6  . ELIQUIS 5 MG TABS tablet TAKE 1 TABLET BY MOUTH TWICE DAILY 60 tablet 5  . lisinopril (PRINIVIL,ZESTRIL) 20 MG tablet Take 1 tablet (20 mg total) by mouth daily. 90 tablet 3  . Magnesium 250 MG TABS Take 1 tablet by mouth daily.    . Potassium Chloride ER 20 MEQ TBCR Take 20 mEq by mouth daily. 30 tablet 11  . vitamin B-12 (CYANOCOBALAMIN) 100 MCG tablet Take 100 mcg by mouth daily.    Marland Kitchen diltiazem (CARDIZEM) 30 MG tablet Take 1 tablet every 4 hours AS NEEDED for AFIB heart rate >100 as long as blood pressure >100. 45 tablet 1   No current facility-administered medications for this  encounter.     No Known Allergies  Social History   Socioeconomic History  . Marital status: Widowed    Spouse name: Not on file  . Number of children: Not on file  . Years of education: Not on file  . Highest education level: Not on file  Occupational History  . Not on file  Social Needs  . Financial resource strain: Not on file  . Food insecurity:    Worry: Not on file    Inability: Not on file  . Transportation needs:    Medical: Not on file    Non-medical: Not on file  Tobacco Use  . Smoking status: Never Smoker  . Smokeless tobacco: Never Used  Substance and Sexual Activity  . Alcohol use: No  . Drug use: No  . Sexual activity: Not Currently  Lifestyle  . Physical activity:    Days per week: Not on file    Minutes per session: Not on file  . Stress: Not on file  Relationships  . Social connections:    Talks on phone: Not on file    Gets together: Not on file    Attends religious service: Not on file    Active member of club or organization: Not on file    Attends meetings of clubs or organizations: Not on file    Relationship status: Not on file  .  Intimate partner violence:    Fear of current or ex partner: Not on file    Emotionally abused: Not on file    Physically abused: Not on file    Forced sexual activity: Not on file  Other Topics Concern  . Not on file  Social History Narrative  . Not on file    Family History  Problem Relation Age of Onset  . Heart Problems Mother     ROS- All systems are reviewed and negative except as per the HPI above  Physical Exam: Vitals:   03/22/18 1103  BP: (!) 144/82  Pulse: 62  Weight: 140 lb 3.2 oz (63.6 kg)  Height: 5\' 6"  (1.676 m)   Wt Readings from Last 3 Encounters:  03/22/18 140 lb 3.2 oz (63.6 kg)  02/15/18 143 lb 6.4 oz (65 kg)  12/21/17 139 lb (63 kg)    Labs: Lab Results  Component Value Date   NA 138 12/21/2017   K 4.6 12/21/2017   CL 103 12/21/2017   CO2 28 12/21/2017   GLUCOSE 94  12/21/2017   BUN 15 12/21/2017   CREATININE 0.73 12/21/2017   CALCIUM 9.6 12/21/2017   MG 2.1 12/21/2017   Lab Results  Component Value Date   INR 4.4 (H) 05/03/2009   Lab Results  Component Value Date   CHOL  07/02/2008    130        ATP III CLASSIFICATION:  <200     mg/dL   Desirable  161-096  mg/dL   Borderline High  >=045    mg/dL   High   HDL 42 40/98/1191   LDLCALC  07/02/2008    71        Total Cholesterol/HDL:CHD Risk Coronary Heart Disease Risk Table                     Men   Women  1/2 Average Risk   3.4   3.3   TRIG 87 07/02/2008     GEN- The patient is well appearing, alert and oriented x 3 today.   Head- normocephalic, atraumatic Eyes-  Sclera clear, conjunctiva pink Ears- hearing intact Oropharynx- clear Neck- supple, no JVP Lymph- no cervical lymphadenopathy Lungs- Clear to ausculation bilaterally, normal work of breathing Heart- Regular rate and rhythm, no murmurs, rubs or gallops, PMI not laterally displaced GI- soft, NT, ND, + BS Extremities- no clubbing, cyanosis, or edema MS- no significant deformity or atrophy Skin- no rash or lesion Psych- euthymic mood, full affect Neuro- strength and sensation are intact  EKG-SR at 62 bpm, pr int 172 ms, qrs int 86 ms, qtc 436 ms Epic records reviewed Mag/K+ to be drawn today   Assessment and Plan: 1. Afib  Staying in SR, low afib burden   Explained to pt that she can use 30 mg Cardizem if she does have prolonged episode of afib to help her convert Continue apixaban for a chadsvasc score of at least 5 bmet/mag  2. LV dysfunction f/u echo 11/5 EF returned to normal  Afib clinic in 3 months  Lupita Leash C. [facility] [address] [place], [place] [address] [phone]

## 2018-03-22 NOTE — Patient Instructions (Signed)
Your physician has recommended you make the following change in your medication:  1)Cardizem 30mg -- take 1 tablet every 4 hours AS NEEDED for AFIB heart rate over 100. 

## 2018-04-29 ENCOUNTER — Other Ambulatory Visit: Payer: Self-pay | Admitting: Cardiovascular Disease

## 2018-05-31 ENCOUNTER — Ambulatory Visit: Payer: Medicare HMO | Admitting: Cardiovascular Disease

## 2018-06-21 ENCOUNTER — Encounter (HOSPITAL_COMMUNITY): Payer: Self-pay | Admitting: Nurse Practitioner

## 2018-06-21 ENCOUNTER — Ambulatory Visit (HOSPITAL_COMMUNITY)
Admission: RE | Admit: 2018-06-21 | Discharge: 2018-06-21 | Disposition: A | Discharge status: Home / Self Care | Payer: Medicare HMO | Source: Ambulatory Visit | Attending: Nurse Practitioner | Admitting: Nurse Practitioner

## 2018-06-21 VITALS — BP 140/82 | HR 66 | Ht 66.0 in | Wt 141.0 lb

## 2018-06-21 DIAGNOSIS — Z7901 Long term (current) use of anticoagulants: Secondary | ICD-10-CM | POA: Diagnosis not present

## 2018-06-21 DIAGNOSIS — Z79899 Other long term (current) drug therapy: Secondary | ICD-10-CM | POA: Insufficient documentation

## 2018-06-21 DIAGNOSIS — I4891 Unspecified atrial fibrillation: Secondary | ICD-10-CM | POA: Diagnosis present

## 2018-06-21 DIAGNOSIS — L719 Rosacea, unspecified: Secondary | ICD-10-CM | POA: Diagnosis not present

## 2018-06-21 DIAGNOSIS — I119 Hypertensive heart disease without heart failure: Secondary | ICD-10-CM | POA: Diagnosis not present

## 2018-06-21 DIAGNOSIS — I498 Other specified cardiac arrhythmias: Secondary | ICD-10-CM | POA: Diagnosis not present

## 2018-06-21 DIAGNOSIS — I480 Paroxysmal atrial fibrillation: Secondary | ICD-10-CM

## 2018-06-21 LAB — BASIC METABOLIC PANEL
Anion gap: 7 (ref 5–15)
BUN: 16 mg/dL (ref 8–23)
CO2: 26 mmol/L (ref 22–32)
Calcium: 9.5 mg/dL (ref 8.9–10.3)
Chloride: 108 mmol/L (ref 98–111)
Creatinine, Ser: 0.78 mg/dL (ref 0.44–1.00)
GFR calc Af Amer: >60 mL/min (ref >60)
GFR calc non Af Amer: >60 mL/min (ref >60)
Glucose, Bld: 88 mg/dL (ref 70–99)
Potassium: 4.4 mmol/L (ref 3.5–5.1)
Sodium: 141 mmol/L (ref 135–145)

## 2018-06-21 LAB — MAGNESIUM: Magnesium: 2.2 mg/dL (ref 1.7–2.4)

## 2018-06-21 NOTE — Progress Notes (Signed)
Primary Care Physician: Thayer Headings, MD Referring Physician: [facility] ER f/u Cardiologist: Dr. Eden Emms EP: Dr. [name] is a 84 y.o. female with a h/o paroxysmal afib in the afib clinic for f/u  tikosyn use. No breakthrough afib. She feels well.  F/u in afib clinic for Tikosyn use. QTc stable, no significant breakthrough afib.NO issues with anticoagulation.  Today, she denies symptoms of palpitations, chest pain, shortness of breath, orthopnea, PND, lower extremity edema, dizziness, presyncope, syncope, or neurologic sequela. The patient is tolerating medications without difficulties and is otherwise without complaint today.   Past Medical History:  Diagnosis Date  . Atrial fibrillation (HCC)    maintained in sinus on Flecainide  . Cataract   . ELECTROCARDIOGRAM, ABNORMAL   . HTN (hypertension)   . Rosacea    Past Surgical History:  Procedure Laterality Date  . VARICOSE VEIN SURGERY      Current Outpatient Medications  Medication Sig Dispense Refill  . cholecalciferol (VITAMIN D) 1000 units tablet Take 1,000 Units by mouth daily.    Marland Kitchen diltiazem (CARDIZEM) 30 MG tablet Take 1 tablet every 4 hours AS NEEDED for AFIB heart rate >100 as long as blood pressure >100. 45 tablet 1  . dofetilide (TIKOSYN) 250 MCG capsule Take 1 capsule (250 mcg total) by mouth 2 (two) times daily. 60 capsule 6  . ELIQUIS 5 MG TABS tablet TAKE 1 TABLET BY MOUTH TWICE DAILY 60 tablet 5  . lisinopril (PRINIVIL,ZESTRIL) 20 MG tablet TAKE ONE TABLET BY MOUTH DAILY 90 tablet 1  . Magnesium 250 MG TABS Take 1 tablet by mouth. Pt taking sporadically    . Potassium Chloride ER 20 MEQ TBCR Take 20 mEq by mouth daily. 30 tablet 11  . vitamin B-12 (CYANOCOBALAMIN) 100 MCG tablet Take 100 mcg by mouth daily.     No current facility-administered medications for this encounter.     No Known Allergies  Social History   Socioeconomic History  . Marital status: Widowed    Spouse name: Not on  file  . Number of children: Not on file  . Years of education: Not on file  . Highest education level: Not on file  Occupational History  . Not on file  Social Needs  . Financial resource strain: Not on file  . Food insecurity:    Worry: Not on file    Inability: Not on file  . Transportation needs:    Medical: Not on file    Non-medical: Not on file  Tobacco Use  . Smoking status: Never Smoker  . Smokeless tobacco: Never Used  Substance and Sexual Activity  . Alcohol use: No  . Drug use: No  . Sexual activity: Not Currently  Lifestyle  . Physical activity:    Days per week: Not on file    Minutes per session: Not on file  . Stress: Not on file  Relationships  . Social connections:    Talks on phone: Not on file    Gets together: Not on file    Attends religious service: Not on file    Active member of club or organization: Not on file    Attends meetings of clubs or organizations: Not on file    Relationship status: Not on file  . Intimate partner violence:    Fear of current or ex partner: Not on file    Emotionally abused: Not on file    Physically abused: Not on file    Forced sexual  activity: Not on file  Other Topics Concern  . Not on file  Social History Narrative  . Not on file    Family History  Problem Relation Age of Onset  . Heart Problems Mother     ROS- All systems are reviewed and negative except as per the HPI above  Physical Exam: Vitals:   06/21/18 1124  BP: 140/82  Pulse: 66  Weight: 141 lb (64 kg)  Height: 5\' 6"  (1.676 m)   Wt Readings from Last 3 Encounters:  06/21/18 141 lb (64 kg)  03/22/18 140 lb 3.2 oz (63.6 kg)  02/15/18 143 lb 6.4 oz (65 kg)    Labs: Lab Results  Component Value Date   NA 141 06/21/2018   K 4.4 06/21/2018   CL 108 06/21/2018   CO2 26 06/21/2018   GLUCOSE 88 06/21/2018   BUN 16 06/21/2018   CREATININE 0.78 06/21/2018   CALCIUM 9.5 06/21/2018   MG 2.2 06/21/2018   Lab Results  Component Value  Date   INR 4.4 (H) 05/03/2009   Lab Results  Component Value Date   CHOL  07/02/2008    130        ATP III CLASSIFICATION:  <200     mg/dL   Desirable  749-449  mg/dL   Borderline High  >=675    mg/dL   High   HDL 42 91/63/8466   LDLCALC  07/02/2008    71        Total Cholesterol/HDL:CHD Risk Coronary Heart Disease Risk Table                     Men   Women  1/2 Average Risk   3.4   3.3   TRIG 87 07/02/2008     GEN- The patient is well appearing, alert and oriented x 3 today.   Head- normocephalic, atraumatic Eyes-  Sclera clear, conjunctiva pink Ears- hearing intact Oropharynx- clear Neck- supple, no JVP Lymph- no cervical lymphadenopathy Lungs- Clear to ausculation bilaterally, normal work of breathing Heart- Regular rate and rhythm, no murmurs, rubs or gallops, PMI not laterally displaced GI- soft, NT, ND, + BS Extremities- no clubbing, cyanosis, or edema MS- no significant deformity or atrophy Skin- no rash or lesion Psych- euthymic mood, full affect Neuro- strength and sensation are intact  EKG-SR at 66 bpm, pr int 178 ms, qrs int 86 ms, qtc 450 ms Epic records reviewed    Assessment and Plan: 1. Afib  Staying in SR Doing well on tikosyn Continue Tikosyn and Cardizem Continue apixaban for a chadsvasc score of at least 5 Bmet/mag today  2. LV dysfunction EF returned to normal with SR  Afib clinic in 3 months  [facility]a [facility] [address] [place], [place] [address] [phone]

## 2018-07-07 IMAGING — DX DG CHEST 1V PORT
1 series · 1 of 1 positions shown · non-contrast
Comparison: Chest radiograph May 21, 2017

CLINICAL DATA: Chest pain and tachycardia. Atrial fibrillation, on
Eliquis.

EXAM:
PORTABLE CHEST 1 VIEW

[chest]
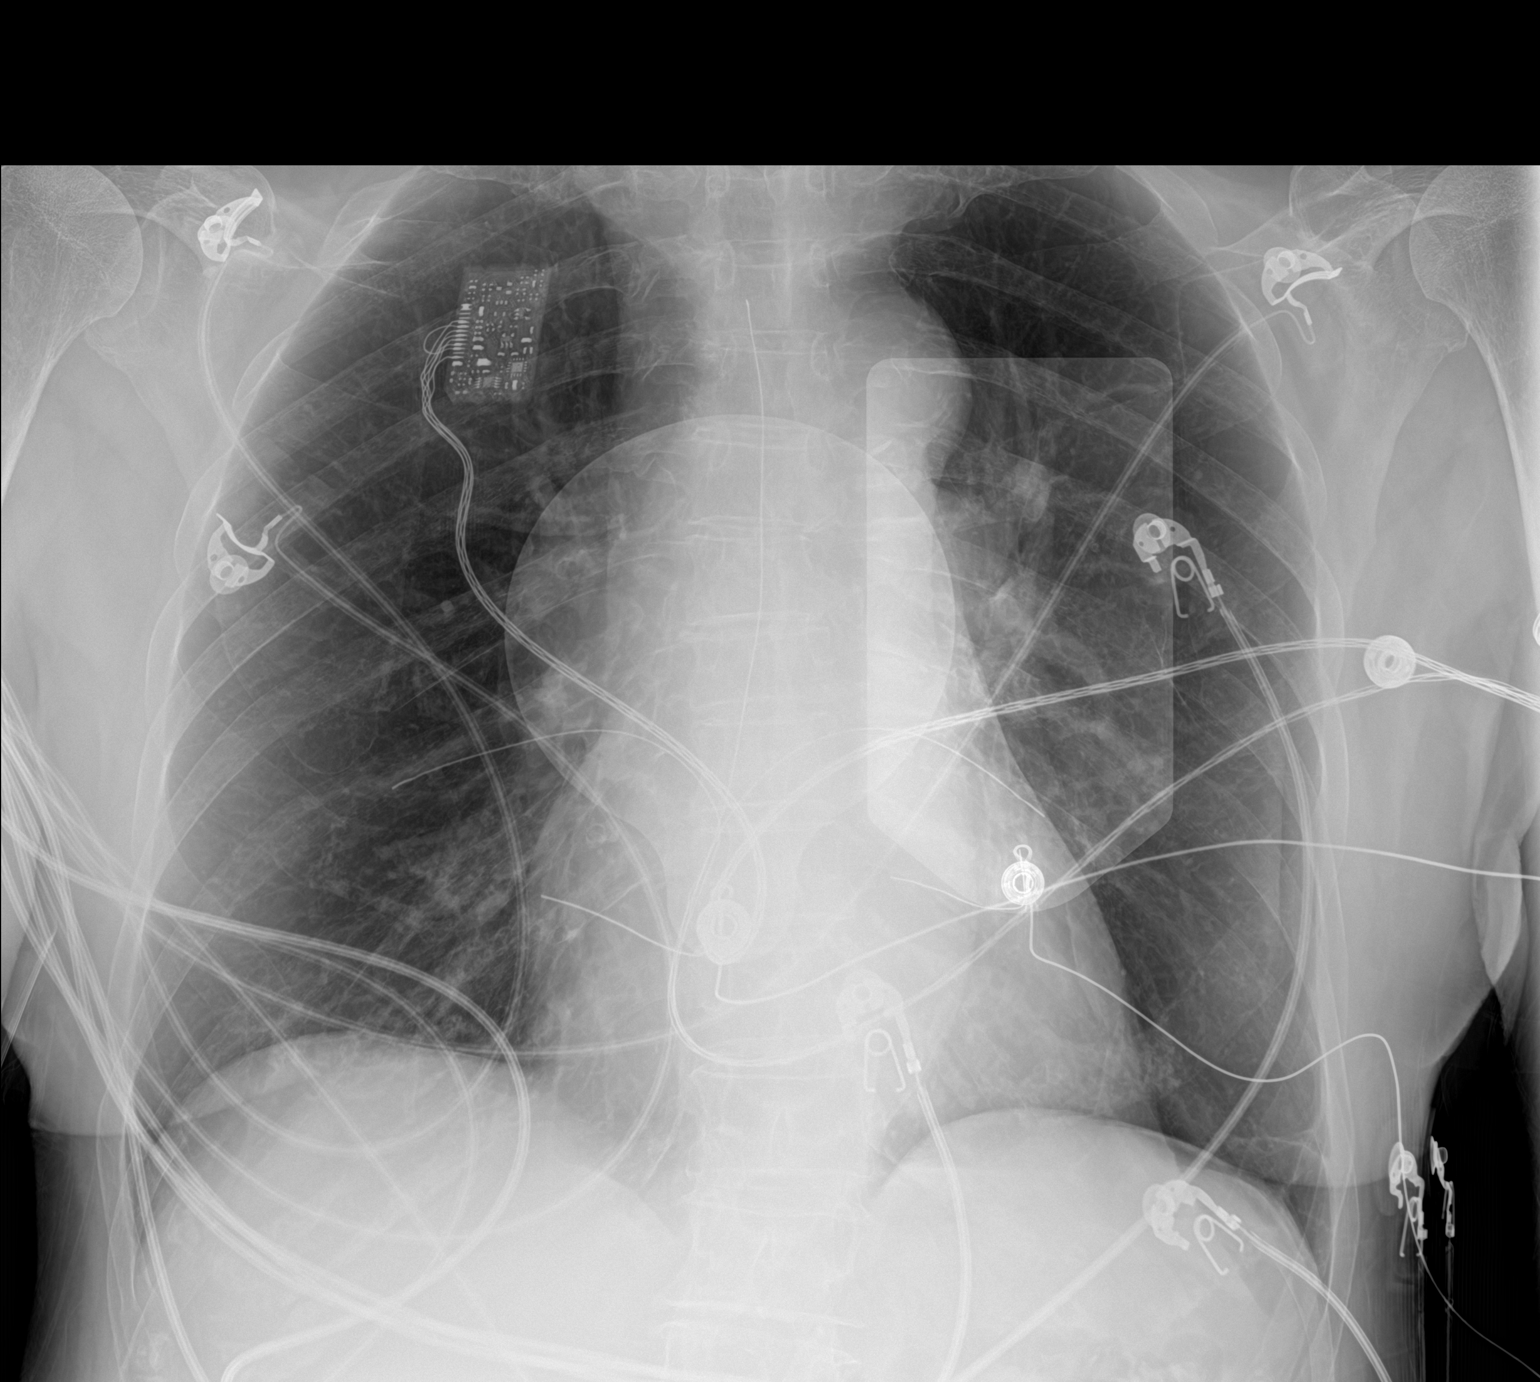

[1 of 1 positions shown; findings below may reference images not displayed]

FINDINGS: Cardiomediastinal silhouette is normal. Calcified aortic knob.
Unchanged LEFT lung base scarring. No pleural effusions or focal
consolidations. Trachea projects midline and there is no
pneumothorax. Soft tissue planes and included osseous structures are
non-suspicious. Single level mild midthoracic suspected compression
fracture. Multiple pacer pads and wires overlie the chest.
IMPRESSION: Scarring LEFT lung base without acute cardiopulmonary process.

Aortic Atherosclerosis ([id number].R).

## 2018-07-22 ENCOUNTER — Other Ambulatory Visit (HOSPITAL_COMMUNITY): Payer: Self-pay | Admitting: Nurse Practitioner

## 2018-09-06 ENCOUNTER — Other Ambulatory Visit (HOSPITAL_COMMUNITY): Payer: Self-pay | Admitting: Nurse Practitioner

## 2018-09-20 ENCOUNTER — Ambulatory Visit (HOSPITAL_COMMUNITY)
Admission: RE | Admit: 2018-09-20 | Discharge: 2018-09-20 | Disposition: A | Discharge status: Home / Self Care | Payer: Medicare HMO | Source: Ambulatory Visit | Attending: Nurse Practitioner | Admitting: Nurse Practitioner

## 2018-09-20 ENCOUNTER — Encounter (HOSPITAL_COMMUNITY): Payer: Self-pay | Admitting: Nurse Practitioner

## 2018-09-20 VITALS — BP 148/76 | HR 62 | Ht 66.0 in | Wt 139.0 lb

## 2018-09-20 DIAGNOSIS — I4891 Unspecified atrial fibrillation: Secondary | ICD-10-CM | POA: Insufficient documentation

## 2018-09-20 DIAGNOSIS — I480 Paroxysmal atrial fibrillation: Secondary | ICD-10-CM | POA: Diagnosis not present

## 2018-09-20 DIAGNOSIS — L719 Rosacea, unspecified: Secondary | ICD-10-CM | POA: Diagnosis not present

## 2018-09-20 DIAGNOSIS — Z7901 Long term (current) use of anticoagulants: Secondary | ICD-10-CM | POA: Insufficient documentation

## 2018-09-20 DIAGNOSIS — Z79899 Other long term (current) drug therapy: Secondary | ICD-10-CM | POA: Diagnosis not present

## 2018-09-20 DIAGNOSIS — I10 Essential (primary) hypertension: Secondary | ICD-10-CM | POA: Insufficient documentation

## 2018-09-20 LAB — BASIC METABOLIC PANEL
Anion gap: 4 — ABNORMAL LOW (ref 5–15)
BUN: 17 mg/dL (ref 8–23)
CO2: 27 mmol/L (ref 22–32)
Calcium: 9.7 mg/dL (ref 8.9–10.3)
Chloride: 108 mmol/L (ref 98–111)
Creatinine, Ser: 0.79 mg/dL (ref 0.44–1.00)
GFR calc Af Amer: >60 mL/min (ref >60)
GFR calc non Af Amer: >60 mL/min (ref >60)
Glucose, Bld: 90 mg/dL (ref 70–99)
Potassium: 4.5 mmol/L (ref 3.5–5.1)
Sodium: 139 mmol/L (ref 135–145)

## 2018-09-20 LAB — MAGNESIUM: Magnesium: 2.2 mg/dL (ref 1.7–2.4)

## 2018-09-20 MED ORDER — LISINOPRIL 20 MG PO TABS: 20.0000 mg | ORAL_TABLET | Freq: Every day | ORAL | 2 refills | Status: DC

## 2018-09-20 NOTE — Addendum Note (Signed)
Encounter addended by: Shona Simpson, RN on: 09/20/2018 1:32 PM  Actions taken: Order list changed

## 2018-09-20 NOTE — Progress Notes (Signed)
Primary Care Physician: Thayer Headings, MD Referring Physician: [facility] ER f/u Cardiologist: Dr. Eden Emms [name] is a 84 y.o. female with a h/o paroxysmal afib in the afib clinic for f/u  tikosyn use. No breakthrough afib. She feels well. QTc stable..No issues with anticoagulation.  Today, she denies symptoms of palpitations, chest pain, shortness of breath, orthopnea, PND, lower extremity edema, dizziness, presyncope, syncope, or neurologic sequela. The patient is tolerating medications without difficulties and is otherwise without complaint today.   Past Medical History:  Diagnosis Date  . Atrial fibrillation (HCC)    maintained in sinus on Flecainide  . Cataract   . ELECTROCARDIOGRAM, ABNORMAL   . HTN (hypertension)   . Rosacea    Past Surgical History:  Procedure Laterality Date  . VARICOSE VEIN SURGERY      Current Outpatient Medications  Medication Sig Dispense Refill  . cholecalciferol (VITAMIN D) 1000 units tablet Take 1,000 Units by mouth daily.    Marland Kitchen diltiazem (CARDIZEM) 30 MG tablet Take 1 tablet every 4 hours AS NEEDED for AFIB heart rate >100 as long as blood pressure >100. 45 tablet 1  . dofetilide (TIKOSYN) 250 MCG capsule TAKE ONE CAPSULE BY MOUTH TWICE A DAY 60 capsule 5  . ELIQUIS 5 MG TABS tablet TAKE ONE TABLET BY MOUTH TWO TIMES A DAY 60 tablet 6  . lisinopril (PRINIVIL,ZESTRIL) 20 MG tablet Take 1 tablet (20 mg total) by mouth daily. 90 tablet 2  . Potassium Chloride ER 20 MEQ TBCR Take 20 mEq by mouth daily. 30 tablet 11  . vitamin B-12 (CYANOCOBALAMIN) 100 MCG tablet Take 100 mcg by mouth daily.     No current facility-administered medications for this encounter.     No Known Allergies  Social History   Socioeconomic History  . Marital status: Widowed    Spouse name: Not on file  . Number of children: Not on file  . Years of education: Not on file  . Highest education level: Not on file  Occupational History  . Not on  file  Social Needs  . Financial resource strain: Not on file  . Food insecurity:    Worry: Not on file    Inability: Not on file  . Transportation needs:    Medical: Not on file    Non-medical: Not on file  Tobacco Use  . Smoking status: Never Smoker  . Smokeless tobacco: Never Used  Substance and Sexual Activity  . Alcohol use: No  . Drug use: No  . Sexual activity: Not Currently  Lifestyle  . Physical activity:    Days per week: Not on file    Minutes per session: Not on file  . Stress: Not on file  Relationships  . Social connections:    Talks on phone: Not on file    Gets together: Not on file    Attends religious service: Not on file    Active member of club or organization: Not on file    Attends meetings of clubs or organizations: Not on file    Relationship status: Not on file  . Intimate partner violence:    Fear of current or ex partner: Not on file    Emotionally abused: Not on file    Physically abused: Not on file    Forced sexual activity: Not on file  Other Topics Concern  . Not on file  Social History Narrative  . Not on file    Family History  Problem Relation Age of Onset  . Heart Problems Mother     ROS- All systems are reviewed and negative except as per the HPI above  Physical Exam: Vitals:   09/20/18 1103  BP: (!) 148/76  Pulse: 62  Weight: 63 kg  Height: 5\' 6"  (1.676 m)   Wt Readings from Last 3 Encounters:  09/20/18 63 kg  06/21/18 64 kg  03/22/18 63.6 kg    Labs: Lab Results  Component Value Date   NA 141 06/21/2018   K 4.4 06/21/2018   CL 108 06/21/2018   CO2 26 06/21/2018   GLUCOSE 88 06/21/2018   BUN 16 06/21/2018   CREATININE 0.78 06/21/2018   CALCIUM 9.5 06/21/2018   MG 2.2 06/21/2018   Lab Results  Component Value Date   INR 4.4 (H) 05/03/2009   Lab Results  Component Value Date   CHOL  07/02/2008    130        ATP III CLASSIFICATION:  <200     mg/dL   Desirable  917-915  mg/dL   Borderline High  >=056     mg/dL   High   HDL 42 97/94/8016   LDLCALC  07/02/2008    71        Total Cholesterol/HDL:CHD Risk Coronary Heart Disease Risk Table                     Men   Women  1/2 Average Risk   3.4   3.3   TRIG 87 07/02/2008     GEN- The patient is well appearing, alert and oriented x 3 today.   Head- normocephalic, atraumatic Eyes-  Sclera clear, conjunctiva pink Ears- hearing intact Oropharynx- clear Neck- supple, no JVP Lymph- no cervical lymphadenopathy Lungs- Clear to ausculation bilaterally, normal work of breathing Heart- Regular rate and rhythm, no murmurs, rubs or gallops, PMI not laterally displaced GI- soft, NT, ND, + BS Extremities- no clubbing, cyanosis, or edema MS- no significant deformity or atrophy Skin- no rash or lesion Psych- euthymic mood, full affect Neuro- strength and sensation are intact  EKG-SR at 62 bpm, pr int 176 ms, qrs int 84 ms, qtc 458 ms Epic records reviewed    Assessment and Plan: 1. Afib  Staying in SR Doing well on tikosyn Continue Tikosyn and Cardizem 30 mg prn Continue apixaban for a chadsvasc score of at least 5 Bmet/mag today  2. LV dysfunction EF returned to normal with SR  Afib clinic in 4 months  [facility]a [facility] [address] [place], [place] [address] [phone]

## 2018-11-17 ENCOUNTER — Other Ambulatory Visit: Payer: Self-pay | Admitting: Cardiovascular Disease

## 2018-11-19 ENCOUNTER — Other Ambulatory Visit (HOSPITAL_COMMUNITY): Payer: Self-pay | Admitting: *Deleted

## 2018-11-19 MED ORDER — POTASSIUM CHLORIDE ER 20 MEQ PO TBCR: 20.0000 meq | EXTENDED_RELEASE_TABLET | Freq: Every day | ORAL | 11 refills | Status: DC

## 2018-12-23 DIAGNOSIS — E538 Deficiency of other specified B group vitamins: Secondary | ICD-10-CM | POA: Diagnosis not present

## 2018-12-23 DIAGNOSIS — E7800 Pure hypercholesterolemia, unspecified: Secondary | ICD-10-CM | POA: Diagnosis not present

## 2018-12-23 DIAGNOSIS — M810 Age-related osteoporosis without current pathological fracture: Secondary | ICD-10-CM | POA: Diagnosis not present

## 2018-12-23 DIAGNOSIS — E039 Hypothyroidism, unspecified: Secondary | ICD-10-CM | POA: Diagnosis not present

## 2018-12-23 DIAGNOSIS — I129 Hypertensive chronic kidney disease with stage 1 through stage 4 chronic kidney disease, or unspecified chronic kidney disease: Secondary | ICD-10-CM | POA: Diagnosis not present

## 2018-12-23 DIAGNOSIS — I10 Essential (primary) hypertension: Secondary | ICD-10-CM | POA: Diagnosis not present

## 2018-12-24 DIAGNOSIS — R040 Epistaxis: Secondary | ICD-10-CM | POA: Diagnosis not present

## 2018-12-24 DIAGNOSIS — I4891 Unspecified atrial fibrillation: Secondary | ICD-10-CM | POA: Diagnosis not present

## 2018-12-30 DIAGNOSIS — Z0000 Encounter for general adult medical examination without abnormal findings: Secondary | ICD-10-CM | POA: Diagnosis not present

## 2018-12-30 DIAGNOSIS — E039 Hypothyroidism, unspecified: Secondary | ICD-10-CM | POA: Diagnosis not present

## 2018-12-30 DIAGNOSIS — I10 Essential (primary) hypertension: Secondary | ICD-10-CM | POA: Diagnosis not present

## 2018-12-30 DIAGNOSIS — R946 Abnormal results of thyroid function studies: Secondary | ICD-10-CM | POA: Diagnosis not present

## 2018-12-30 DIAGNOSIS — I480 Paroxysmal atrial fibrillation: Secondary | ICD-10-CM | POA: Diagnosis not present

## 2018-12-30 DIAGNOSIS — R040 Epistaxis: Secondary | ICD-10-CM | POA: Diagnosis not present

## 2019-01-08 DIAGNOSIS — R040 Epistaxis: Secondary | ICD-10-CM | POA: Diagnosis not present

## 2019-01-17 ENCOUNTER — Ambulatory Visit (HOSPITAL_COMMUNITY)
Admission: RE | Admit: 2019-01-17 | Discharge: 2019-01-17 | Disposition: A | Discharge status: Home / Self Care | Payer: Medicare HMO | Source: Ambulatory Visit | Attending: Nurse Practitioner | Admitting: Nurse Practitioner

## 2019-01-17 ENCOUNTER — Encounter (HOSPITAL_COMMUNITY): Payer: Self-pay | Admitting: Nurse Practitioner

## 2019-01-17 VITALS — BP 144/66 | HR 64 | Ht 66.0 in | Wt 144.0 lb

## 2019-01-17 DIAGNOSIS — Z79899 Other long term (current) drug therapy: Secondary | ICD-10-CM | POA: Diagnosis not present

## 2019-01-17 DIAGNOSIS — I480 Paroxysmal atrial fibrillation: Secondary | ICD-10-CM | POA: Diagnosis not present

## 2019-01-17 DIAGNOSIS — I501 Left ventricular failure: Secondary | ICD-10-CM | POA: Insufficient documentation

## 2019-01-17 DIAGNOSIS — Z7901 Long term (current) use of anticoagulants: Secondary | ICD-10-CM | POA: Insufficient documentation

## 2019-01-17 NOTE — Progress Notes (Signed)
Primary Care Physician: Thayer Headings, MD (Inactive) Referring Physician: [facility] ER f/u Cardiologist: Dr. Eden Emms [name] is a 85 y.o. female with a h/o paroxysmal afib in Amanda afib clinic for f/u  tikosyn use. One recent afib episode that last several hours, otherwise normal rhythm . Amanda Olsen feels well. QTc stable.  Amanda Olsen started having nose bleeds at Amanda end   to Amanda point that Amanda Olsen nose was bleeding every day. Amanda Olsen stopped eliquis on Amanda Olsen own and had f/u with ENT. At that point, he could not see any source of bleeding. Saline nose spray was recommended.  Amanda Olsen reports that Amanda Olsen has been off anticoagulation x 2 weeks and no further nose bleeds..  Today, Amanda Olsen denies symptoms of palpitations, chest pain, shortness of breath, orthopnea, PND, lower extremity edema, dizziness, presyncope, syncope, or neurologic sequela. Amanda patient is tolerating medications without difficulties and is otherwise without complaint today.   Past Medical History:  Diagnosis Date  . Atrial fibrillation (HCC)    maintained in sinus on Flecainide  . Cataract   . ELECTROCARDIOGRAM, ABNORMAL   . HTN (hypertension)   . Rosacea    Past Surgical History:  Procedure Laterality Date  . VARICOSE VEIN SURGERY      Current Outpatient Medications  Medication Sig Dispense Refill  . cholecalciferol (VITAMIN D) 1000 units tablet Take 1,000 Units by mouth daily.    Marland Kitchen diltiazem (CARDIZEM) 30 MG tablet Take 1 tablet every 4 hours AS NEEDED for AFIB heart rate >100 as long as blood pressure >100. 45 tablet 1  . dofetilide (TIKOSYN) 250 MCG capsule TAKE ONE CAPSULE BY MOUTH TWICE A DAY 60 capsule 5  . lisinopril (PRINIVIL,ZESTRIL) 20 MG tablet Take 1 tablet (20 mg total) by mouth daily. 90 tablet 2  . Potassium Chloride ER 20 MEQ TBCR Take 20 mEq by mouth daily. 30 tablet 11  . vitamin B-12 (CYANOCOBALAMIN) 100 MCG tablet Take 100 mcg by mouth daily.     No current facility-administered medications for this  encounter.     No Known Allergies  Social History   Socioeconomic History  . Marital status: Widowed    Spouse name: Not on file  . Number of children: Not on file  . Years of education: Not on file  . Highest education level: Not on file  Occupational History  . Not on file  Social Needs  . Financial resource strain: Not on file  . Food insecurity:    Worry: Not on file    Inability: Not on file  . Transportation needs:    Medical: Not on file    Non-medical: Not on file  Tobacco Use  . Smoking status: Never Smoker  . Smokeless tobacco: Never Used  Substance and Sexual Activity  . Alcohol use: No  . Drug use: No  . Sexual activity: Not Currently  Lifestyle  . Physical activity:    Days per week: Not on file    Minutes per session: Not on file  . Stress: Not on file  Relationships  . Social connections:    Talks on phone: Not on file    Gets together: Not on file    Attends religious service: Not on file    Active member of club or organization: Not on file    Attends meetings of clubs or organizations: Not on file    Relationship status: Not on file  . Intimate partner violence:    Fear of current or  ex partner: Not on file    Emotionally abused: Not on file    Physically abused: Not on file    Forced sexual activity: Not on file  Other Topics Concern  . Not on file  Social History Narrative  . Not on file    Family History  Problem Relation Age of Onset  . Heart Problems Mother     ROS- All systems are reviewed and negative except as per Amanda HPI above  Physical Exam: Vitals:   01/17/19 1107  BP: (!) 144/66  Pulse: 64  Weight: 65.3 kg  Height: 5\' 6"  (1.676 m)   Wt Readings from Last 3 Encounters:  01/17/19 65.3 kg  09/20/18 63 kg  06/21/18 64 kg    Labs: Lab Results  Component Value Date   NA 139 09/20/2018   K 4.5 09/20/2018   CL 108 09/20/2018   CO2 27 09/20/2018   GLUCOSE 90 09/20/2018   BUN 17 09/20/2018   CREATININE 0.79  09/20/2018   CALCIUM 9.7 09/20/2018   MG 2.2 09/20/2018   Lab Results  Component Value Date   INR 4.4 (H) 05/03/2009   Lab Results  Component Value Date   CHOL  07/02/2008    130        ATP III CLASSIFICATION:  <200     mg/dL   Desirable  183-358  mg/dL   Borderline High  >=251    mg/dL   High   HDL 42 89/84/2103   LDLCALC  07/02/2008    71        Total Cholesterol/HDL:CHD Risk Coronary Heart Disease Risk Table                     Men   Women  1/2 Average Risk   3.4   3.3   TRIG 87 07/02/2008     GEN- Amanda Olsen, alert and oriented x 3 today.   Head- normocephalic, atraumatic Eyes-  Sclera clear, conjunctiva pink Ears- hearing intact Oropharynx- clear Neck- supple, no JVP Lymph- no cervical lymphadenopathy Lungs- Clear to ausculation bilaterally, normal work of breathing Heart- Regular rate and rhythm, no murmurs, rubs or gallops, PMI not laterally displaced GI- soft, NT, ND, + BS Extremities- no clubbing, cyanosis, or edema MS- no significant deformity or atrophy Skin- no rash or lesion Psych- euthymic mood, full affect Neuro- strength and sensation are intact  EKG-SR at 63 bpm, pr int 172 ms, qrs int 84 ms, qtc 414 ms Epic records reviewed    Assessment and Plan: 1. Afib  Staying in SR Doing well on tikosyn Continue Tikosyn and Cardizem 30 mg prn Continue apixaban for a chadsvasc score of at least 5 Bmet/mag today  2. LV dysfunction EF returned to normal with SR  3. Nosebleeds Off anticoaguation x 2 weeks and no further bleeding ENT did not see any issues why Amanda Olsen should be bleeding but Amanda Olsen had stopped DOAC for several days prior to seeing  ENT Amanda Olsen is agreeable to rechallenge eliquis 5 mg bid, if recurrent bleeding Amanda Olsen will call us. Amanda Olsen will use Amanda saline nose spray 1-2 x daily. [id number] score is 4 so I encouraged Amanda Olsen to re challenge eliquis to reduce Amanda Olsen risk of stroke  Afib clinic in 4 months  Lupita Leash C. Matthew Folks Afib  Clinic [facility] [address] [place], [place] [address] [phone]

## 2019-01-17 NOTE — Patient Instructions (Signed)
Restart eliquis twice a day

## 2019-01-27 DIAGNOSIS — N814 Uterovaginal prolapse, unspecified: Secondary | ICD-10-CM | POA: Diagnosis not present

## 2019-02-07 ENCOUNTER — Telehealth (HOSPITAL_COMMUNITY): Payer: Self-pay | Admitting: *Deleted

## 2019-02-07 MED ORDER — APIXABAN 5 MG PO TABS: 5.0000 mg | ORAL_TABLET | Freq: Two times a day (BID) | ORAL | 0 refills | Status: DC

## 2019-02-07 NOTE — Telephone Encounter (Signed)
Patient daughter-in-law called in stating pt did ok for about 3 weeks on eliquis and then has again developed nose bleeds. Discussed with Rudi Coco NP - instructed to contact ENT to be assessed ON ELIQUIS for nosebleeds so he can see what area needs attention. Daughter-in-law will call Dr. Jearld Fenton office to arrange follow up visit asap.

## 2019-02-16 ENCOUNTER — Other Ambulatory Visit (HOSPITAL_COMMUNITY): Payer: Self-pay | Admitting: Nurse Practitioner

## 2019-03-01 ENCOUNTER — Other Ambulatory Visit (HOSPITAL_COMMUNITY): Payer: Self-pay | Admitting: Nurse Practitioner

## 2019-05-23 ENCOUNTER — Ambulatory Visit (HOSPITAL_COMMUNITY): Payer: Medicare HMO | Admitting: Nurse Practitioner

## 2019-06-04 ENCOUNTER — Other Ambulatory Visit: Payer: Self-pay

## 2019-06-04 ENCOUNTER — Encounter (HOSPITAL_COMMUNITY): Payer: Self-pay | Admitting: Nurse Practitioner

## 2019-06-04 ENCOUNTER — Ambulatory Visit (HOSPITAL_COMMUNITY)
Admission: RE | Admit: 2019-06-04 | Discharge: 2019-06-04 | Disposition: A | Discharge status: Home / Self Care | Payer: Medicare HMO | Source: Ambulatory Visit | Attending: Nurse Practitioner | Admitting: Nurse Practitioner

## 2019-06-04 VITALS — BP 164/68 | HR 82 | Ht 66.0 in | Wt 135.0 lb

## 2019-06-04 DIAGNOSIS — I480 Paroxysmal atrial fibrillation: Secondary | ICD-10-CM | POA: Insufficient documentation

## 2019-06-04 DIAGNOSIS — Z8249 Family history of ischemic heart disease and other diseases of the circulatory system: Secondary | ICD-10-CM | POA: Diagnosis not present

## 2019-06-04 DIAGNOSIS — Z79899 Other long term (current) drug therapy: Secondary | ICD-10-CM | POA: Insufficient documentation

## 2019-06-04 DIAGNOSIS — I10 Essential (primary) hypertension: Secondary | ICD-10-CM | POA: Diagnosis not present

## 2019-06-04 LAB — BASIC METABOLIC PANEL
Anion gap: 8 (ref 5–15)
BUN: 9 mg/dL (ref 8–23)
CO2: 24 mmol/L (ref 22–32)
Calcium: 9.2 mg/dL (ref 8.9–10.3)
Chloride: 105 mmol/L (ref 98–111)
Creatinine, Ser: 0.73 mg/dL (ref 0.44–1.00)
GFR calc Af Amer: >60 mL/min (ref >60)
GFR calc non Af Amer: >60 mL/min (ref >60)
Glucose, Bld: 102 mg/dL — ABNORMAL HIGH (ref 70–99)
Potassium: 3.9 mmol/L (ref 3.5–5.1)
Sodium: 137 mmol/L (ref 135–145)

## 2019-06-04 LAB — MAGNESIUM: Magnesium: 2.2 mg/dL (ref 1.7–2.4)

## 2019-06-05 ENCOUNTER — Encounter (HOSPITAL_COMMUNITY): Payer: Self-pay | Admitting: Nurse Practitioner

## 2019-06-05 NOTE — Progress Notes (Signed)
Primary Care Physician: Thayer HeadingsMackenzie, Brian, MD (Inactive) [facility] Cardiologist: Dr. Eden EmmsNishan EP: Dr. Louretta Shortenamnitz   Amanda Olsen is a 85 y.o. female with a h/o paroxysmal afib in the afib clinic for f/u  tikosyn use. One recent afib episode that last 3 hours, otherwise normal rhythm . She feels well. QTc stable.  She started having nose bleeds at the end   to the point that her nose was bleeding every day. She stopped eliquis on her own and had f/u with ENT. At that point, he could not see any source of bleeding. Saline nose spray was recommended. I then convinced her to try one more time. He again within a few days had significant nose bleed. She  Stopped anticoagulation and saw the ENT again and he could not find the source of bleed. After that, approximately 3 weeks ago, the pt decided to stop anticoagulation. We discussed her risk of stroke off DOAC, but she states she is old and what will be, will be.   Today, she denies symptoms of palpitations, chest pain, shortness of breath, orthopnea, PND, lower extremity edema, dizziness, presyncope, syncope, or neurologic sequela. The patient is tolerating medications without difficulties and is otherwise without complaint today.   Past Medical History:  Diagnosis Date  . Atrial fibrillation (HCC)    maintained in sinus on Flecainide  . Cataract   . ELECTROCARDIOGRAM, ABNORMAL   . HTN (hypertension)   . Rosacea    Past Surgical History:  Procedure Laterality Date  . VARICOSE VEIN SURGERY      Current Outpatient Medications  Medication Sig Dispense Refill  . cholecalciferol (VITAMIN D) 1000 units tablet Take 1,000 Units by mouth daily.    Marland Kitchen. dofetilide (TIKOSYN) 250 MCG capsule TAKE ONE CAPSULE BY MOUTH TWICE A DAY 60 capsule 4  . lisinopril (PRINIVIL,ZESTRIL) 20 MG tablet Take 1 tablet (20 mg total) by mouth daily. 90 tablet 2  . Potassium Chloride ER 20 MEQ TBCR Take 20 mEq by mouth daily. 30 tablet 11  . vitamin  B-12 (CYANOCOBALAMIN) 100 MCG tablet Take 100 mcg by mouth daily.    Marland Kitchen. diltiazem (CARDIZEM) 30 MG tablet Take 1 tablet every 4 hours AS NEEDED for AFIB heart rate >100 as long as blood pressure >100. (Patient not taking: Reported on 06/04/2019) 45 tablet 1   No current facility-administered medications for this encounter.     No Known Allergies  Social History   Socioeconomic History  . Marital status: Widowed    Spouse name: Not on file  . Number of children: Not on file  . Years of education: Not on file  . Highest education level: Not on file  Occupational History  . Not on file  Social Needs  . Financial resource strain: Not on file  . Food insecurity    Worry: Not on file    Inability: Not on file  . Transportation needs    Medical: Not on file    Non-medical: Not on file  Tobacco Use  . Smoking status: Never Smoker  . Smokeless tobacco: Never Used  Substance and Sexual Activity  . Alcohol use: No  . Drug use: No  . Sexual activity: Not Currently  Lifestyle  . Physical activity    Days per week: Not on file    Minutes per session: Not on file  . Stress: Not on file  Relationships  . Social connections    Talks on phone: Not on file  Gets together: Not on file    Attends religious service: Not on file    Active member of club or organization: Not on file    Attends meetings of clubs or organizations: Not on file    Relationship status: Not on file  . Intimate partner violence    Fear of current or ex partner: Not on file    Emotionally abused: Not on file    Physically abused: Not on file    Forced sexual activity: Not on file  Other Topics Concern  . Not on file  Social History Narrative  . Not on file    Family History  Problem Relation Age of Onset  . Heart Problems Mother     ROS- All systems are reviewed and negative except as per the HPI above  Physical Exam: Vitals:   06/04/19 1434  BP: (!) 164/68  Pulse: 82  Weight: 61.2 kg  Height:  5\' 6"  (1.676 m)   Wt Readings from Last 3 Encounters:  06/04/19 61.2 kg  01/17/19 65.3 kg  09/20/18 63 kg    Labs: Lab Results  Component Value Date   NA 137 06/04/2019   K 3.9 06/04/2019   CL 105 06/04/2019   CO2 24 06/04/2019   GLUCOSE 102 (H) 06/04/2019   BUN 9 06/04/2019   CREATININE 0.73 06/04/2019   CALCIUM 9.2 06/04/2019   MG 2.2 06/04/2019   Lab Results  Component Value Date   INR 4.4 (H) 05/03/2009   Lab Results  Component Value Date   CHOL  07/02/2008    130        ATP III CLASSIFICATION:  <200     mg/dL   Desirable  591-638  mg/dL   Borderline High  >=466    mg/dL   High   HDL 42 59/93/5701   LDLCALC  07/02/2008    71        Total Cholesterol/HDL:CHD Risk Coronary Heart Disease Risk Table                     Men   Women  1/2 Average Risk   3.4   3.3   TRIG 87 07/02/2008     GEN- The patient is well appearing, alert and oriented x 3 today.   Head- normocephalic, atraumatic Eyes-  Sclera clear, conjunctiva pink Ears- hearing intact Oropharynx- clear Neck- supple, no JVP Lymph- no cervical lymphadenopathy Lungs- Clear to ausculation bilaterally, normal work of breathing Heart- Regular rate and rhythm, no murmurs, rubs or gallops, PMI not laterally displaced GI- soft, NT, ND, + BS Extremities- no clubbing, cyanosis, or edema MS- no significant deformity or atrophy Skin- no rash or lesion Psych- euthymic mood, full affect Neuro- strength and sensation are intact  EKG-SR at 82  bpm, pr int 182 ms, qrs int 86 ms, qtc 509 ms, qtc  With a run of PVC's vrs fusion beats . Will forward to Dr. Geronimo Running review Epic records reviewed    Assessment and Plan: 1. Afib  Staying in SR Doing well on tikosyn Continue Tikosyn and Cardizem 30 mg prn Chadsvasc score of at least 4, has had 2 significant nosebleeds that ENT has not been able to identify a source Pt is clear in her decision, knowing stroke risk, that she will no longer take anticoagulation She  wants to try an ASA knowing that this does not   protect her for stroke from  aifb Bmet/mag today  2. LV dysfunction EF returned  to normal with SR   Afib clinic in 4 months  [name] , [facility] [address] [place], [place] [address] [phone]

## 2019-06-09 ENCOUNTER — Other Ambulatory Visit (HOSPITAL_COMMUNITY): Payer: Self-pay | Admitting: *Deleted

## 2019-06-09 MED ORDER — LISINOPRIL 20 MG PO TABS: 20.0000 mg | ORAL_TABLET | Freq: Every day | ORAL | 2 refills | Status: DC

## 2019-06-23 DIAGNOSIS — I10 Essential (primary) hypertension: Secondary | ICD-10-CM | POA: Diagnosis not present

## 2019-06-23 DIAGNOSIS — R946 Abnormal results of thyroid function studies: Secondary | ICD-10-CM | POA: Diagnosis not present

## 2019-06-30 DIAGNOSIS — N814 Uterovaginal prolapse, unspecified: Secondary | ICD-10-CM | POA: Diagnosis not present

## 2019-06-30 DIAGNOSIS — E559 Vitamin D deficiency, unspecified: Secondary | ICD-10-CM | POA: Diagnosis not present

## 2019-06-30 DIAGNOSIS — Z7189 Other specified counseling: Secondary | ICD-10-CM | POA: Diagnosis not present

## 2019-06-30 DIAGNOSIS — R946 Abnormal results of thyroid function studies: Secondary | ICD-10-CM | POA: Diagnosis not present

## 2019-06-30 DIAGNOSIS — Z0000 Encounter for general adult medical examination without abnormal findings: Secondary | ICD-10-CM | POA: Diagnosis not present

## 2019-06-30 DIAGNOSIS — I10 Essential (primary) hypertension: Secondary | ICD-10-CM | POA: Diagnosis not present

## 2019-06-30 DIAGNOSIS — I480 Paroxysmal atrial fibrillation: Secondary | ICD-10-CM | POA: Diagnosis not present

## 2019-06-30 DIAGNOSIS — E538 Deficiency of other specified B group vitamins: Secondary | ICD-10-CM | POA: Diagnosis not present

## 2019-07-29 ENCOUNTER — Other Ambulatory Visit (HOSPITAL_COMMUNITY): Payer: Self-pay | Admitting: Nurse Practitioner

## 2019-07-30 ENCOUNTER — Other Ambulatory Visit (HOSPITAL_COMMUNITY): Payer: Self-pay | Admitting: Nurse Practitioner

## 2019-10-01 ENCOUNTER — Encounter (HOSPITAL_COMMUNITY): Payer: Self-pay | Admitting: Nurse Practitioner

## 2019-10-01 ENCOUNTER — Other Ambulatory Visit: Payer: Self-pay

## 2019-10-01 ENCOUNTER — Ambulatory Visit (HOSPITAL_COMMUNITY)
Admission: RE | Admit: 2019-10-01 | Discharge: 2019-10-01 | Disposition: A | Discharge status: Home / Self Care | Payer: Medicare HMO | Source: Ambulatory Visit | Attending: Nurse Practitioner | Admitting: Nurse Practitioner

## 2019-10-01 VITALS — BP 166/92 | HR 60 | Ht 66.0 in | Wt 135.6 lb

## 2019-10-01 DIAGNOSIS — R9431 Abnormal electrocardiogram [ECG] [EKG]: Secondary | ICD-10-CM | POA: Insufficient documentation

## 2019-10-01 DIAGNOSIS — I498 Other specified cardiac arrhythmias: Secondary | ICD-10-CM | POA: Diagnosis not present

## 2019-10-01 DIAGNOSIS — Z8249 Family history of ischemic heart disease and other diseases of the circulatory system: Secondary | ICD-10-CM | POA: Insufficient documentation

## 2019-10-01 DIAGNOSIS — I110 Hypertensive heart disease with heart failure: Secondary | ICD-10-CM | POA: Insufficient documentation

## 2019-10-01 DIAGNOSIS — Z79899 Other long term (current) drug therapy: Secondary | ICD-10-CM | POA: Insufficient documentation

## 2019-10-01 DIAGNOSIS — I501 Left ventricular failure: Secondary | ICD-10-CM | POA: Insufficient documentation

## 2019-10-01 DIAGNOSIS — I480 Paroxysmal atrial fibrillation: Secondary | ICD-10-CM | POA: Insufficient documentation

## 2019-10-01 LAB — BASIC METABOLIC PANEL
Anion gap: 9 (ref 5–15)
BUN: 14 mg/dL (ref 8–23)
CO2: 22 mmol/L (ref 22–32)
Calcium: 9.3 mg/dL (ref 8.9–10.3)
Chloride: 107 mmol/L (ref 98–111)
Creatinine, Ser: 0.81 mg/dL (ref 0.44–1.00)
GFR calc Af Amer: >60 mL/min (ref >60)
GFR calc non Af Amer: >60 mL/min (ref >60)
Glucose, Bld: 99 mg/dL (ref 70–99)
Potassium: 4.6 mmol/L (ref 3.5–5.1)
Sodium: 138 mmol/L (ref 135–145)

## 2019-10-01 LAB — MAGNESIUM: Magnesium: 2.1 mg/dL (ref 1.7–2.4)

## 2019-10-01 NOTE — Progress Notes (Signed)
Primary Care Physician: Thressa Sheller, MD (Inactive) Referring Physician: [facility] ER f/u Cardiologist: Amanda Olsen EP: Dr. Pennie Banter is a 85 y.o. female with a h/o paroxysmal afib in the afib clinic for f/u  Amanda Olsen use. Reports no afib. She feels well. She is not on anticoagulation per her choice as she had repeat nose bleeds despite having cautery x 2. She was told the risks of stroke being off anticoagulation and she was clear she did not want to be on blood thinners any longer.  Today, she denies symptoms of palpitations, chest pain, shortness of breath, orthopnea, PND, lower extremity edema, dizziness, presyncope, syncope, or neurologic sequela. The patient is tolerating medications without difficulties and is otherwise without complaint today.   Past Medical History:  Diagnosis Date  . Atrial fibrillation ([place])    maintained in sinus on Flecainide  . Cataract   . ELECTROCARDIOGRAM, ABNORMAL   . HTN (hypertension)   . Rosacea    Past Surgical History:  Procedure Laterality Date  . VARICOSE VEIN SURGERY      Current Outpatient Medications  Medication Sig Dispense Refill  . cholecalciferol (VITAMIN D) 1000 units tablet Take 1,000 Units by mouth daily.    Marland Kitchen diltiazem (CARDIZEM) 30 MG tablet Take 1 tablet every 4 hours AS NEEDED for AFIB heart rate >100 as long as blood pressure >100. 45 tablet 1  . dofetilide (Amanda Olsen) 250 MCG capsule TAKE ONE CAPSULE BY MOUTH TWICE A DAY 60 capsule 3  . lisinopril (ZESTRIL) 20 MG tablet Take 1 tablet (20 mg total) by mouth daily. 90 tablet 2  . Potassium Chloride ER 20 MEQ TBCR Take 20 mEq by mouth daily. 30 tablet 11  . vitamin B-12 (CYANOCOBALAMIN) 100 MCG tablet Take 100 mcg by mouth daily.     No current facility-administered medications for this encounter.     No Known Allergies  Social History   Socioeconomic History  . Marital status: Widowed    Spouse name: Not on file  . Number of children: Not on file  .  Years of education: Not on file  . Highest education level: Not on file  Occupational History  . Not on file  Social Needs  . Financial resource strain: Not on file  . Food insecurity    Worry: Not on file    Inability: Not on file  . Transportation needs    Medical: Not on file    Non-medical: Not on file  Tobacco Use  . Smoking status: Never Smoker  . Smokeless tobacco: Never Used  Substance and Sexual Activity  . Alcohol use: No  . Drug use: No  . Sexual activity: Not Currently  Lifestyle  . Physical activity    Days per week: Not on file    Minutes per session: Not on file  . Stress: Not on file  Relationships  . Social Herbalist on phone: Not on file    Gets together: Not on file    Attends religious service: Not on file    Active member of club or organization: Not on file    Attends meetings of clubs or organizations: Not on file    Relationship status: Not on file  . Intimate partner violence    Fear of current or ex partner: Not on file    Emotionally abused: Not on file    Physically abused: Not on file    Forced sexual activity: Not on file  Other  Topics Concern  . Not on file  Social History Narrative  . Not on file    Family History  Problem Relation Age of Onset  . Heart Problems Mother     ROS- All systems are reviewed and negative except as per the HPI above  Physical Exam: Vitals:   10/01/19 1430  BP: (!) 166/92  Pulse: 60  Weight: 61.5 kg  Height: 5\' 6"  (1.676 m)   Wt Readings from Last 3 Encounters:  10/01/19 61.5 kg  06/04/19 61.2 kg  01/17/19 65.3 kg    Labs: Lab Results  Component Value Date   NA 137 06/04/2019   K 3.9 06/04/2019   CL 105 06/04/2019   CO2 24 06/04/2019   GLUCOSE 102 (H) 06/04/2019   BUN 9 06/04/2019   CREATININE 0.73 06/04/2019   CALCIUM 9.2 06/04/2019   MG 2.2 06/04/2019   Lab Results  Component Value Date   INR 4.4 (H) 05/03/2009   Lab Results  Component Value Date   CHOL  07/02/2008     130        ATP III CLASSIFICATION:  <200     mg/dL   Desirable  07/04/2008  mg/dL   Borderline High  295-188    mg/dL   High   HDL 42 >=416   LDLCALC  07/02/2008    71        Total Cholesterol/HDL:CHD Risk Coronary Heart Disease Risk Table                     Men   Women  1/2 Average Risk   3.4   3.3   TRIG 87 07/02/2008     GEN- The patient is well appearing, alert and oriented x 3 today.   Head- normocephalic, atraumatic Eyes-  Sclera clear, conjunctiva pink Ears- hearing intact Oropharynx- clear Neck- supple, no JVP Lymph- no cervical lymphadenopathy Lungs- Clear to ausculation bilaterally, normal work of breathing Heart- Regular rate and rhythm, no murmurs, rubs or gallops, PMI not laterally displaced GI- soft, NT, ND, + BS Extremities- no clubbing, cyanosis, or edema MS- no significant deformity or atrophy Skin- no rash or lesion Psych- euthymic mood, full affect Neuro- strength and sensation are intact  EKG-    Assessment and Plan: 1. Afib  Staying in SR Doing well on Amanda Olsen Continue Amanda Olsen 250 mcg and Cardizem 30 mg prn Chadsvasc score of at least 4, has had 2 significant nosebleeds that ENT has not been able to identify a source Pt is clear in her decision, knowing stroke risk, that she will no longer take anticoagulation Bmet/mag today  2. LV dysfunction EF returned to normal with SR   Afib clinic in 6 months  07/04/2008 C. [facility]up[facility] [address] [place], [place] [phone]

## 2019-10-28 ENCOUNTER — Other Ambulatory Visit (HOSPITAL_COMMUNITY): Payer: Self-pay | Admitting: Nurse Practitioner

## 2019-12-25 DIAGNOSIS — E538 Deficiency of other specified B group vitamins: Secondary | ICD-10-CM | POA: Diagnosis not present

## 2019-12-25 DIAGNOSIS — E559 Vitamin D deficiency, unspecified: Secondary | ICD-10-CM | POA: Diagnosis not present

## 2019-12-25 DIAGNOSIS — R946 Abnormal results of thyroid function studies: Secondary | ICD-10-CM | POA: Diagnosis not present

## 2019-12-25 DIAGNOSIS — I480 Paroxysmal atrial fibrillation: Secondary | ICD-10-CM | POA: Diagnosis not present

## 2019-12-25 DIAGNOSIS — E039 Hypothyroidism, unspecified: Secondary | ICD-10-CM | POA: Diagnosis not present

## 2019-12-25 DIAGNOSIS — I10 Essential (primary) hypertension: Secondary | ICD-10-CM | POA: Diagnosis not present

## 2020-01-01 DIAGNOSIS — I480 Paroxysmal atrial fibrillation: Secondary | ICD-10-CM | POA: Diagnosis not present

## 2020-01-01 DIAGNOSIS — E559 Vitamin D deficiency, unspecified: Secondary | ICD-10-CM | POA: Diagnosis not present

## 2020-01-01 DIAGNOSIS — M810 Age-related osteoporosis without current pathological fracture: Secondary | ICD-10-CM | POA: Diagnosis not present

## 2020-01-01 DIAGNOSIS — K219 Gastro-esophageal reflux disease without esophagitis: Secondary | ICD-10-CM | POA: Diagnosis not present

## 2020-01-01 DIAGNOSIS — I10 Essential (primary) hypertension: Secondary | ICD-10-CM | POA: Diagnosis not present

## 2020-01-01 DIAGNOSIS — Z7189 Other specified counseling: Secondary | ICD-10-CM | POA: Diagnosis not present

## 2020-01-01 DIAGNOSIS — Z0000 Encounter for general adult medical examination without abnormal findings: Secondary | ICD-10-CM | POA: Diagnosis not present

## 2020-02-24 ENCOUNTER — Other Ambulatory Visit (HOSPITAL_COMMUNITY): Payer: Self-pay | Admitting: *Deleted

## 2020-02-24 MED ORDER — DOFETILIDE 250 MCG PO CAPS: 250.0000 ug | ORAL_CAPSULE | Freq: Two times a day (BID) | ORAL | 6 refills | Status: DC

## 2020-03-24 ENCOUNTER — Emergency Department (HOSPITAL_COMMUNITY)
Admission: EM | Admit: 2020-03-24 | Discharge: 2020-03-24 | Disposition: A | Discharge status: Home / Self Care | Payer: Medicare HMO | Attending: Emergency Medicine | Admitting: Emergency Medicine

## 2020-03-24 ENCOUNTER — Encounter (HOSPITAL_COMMUNITY): Payer: Self-pay | Admitting: Emergency Medicine

## 2020-03-24 ENCOUNTER — Other Ambulatory Visit: Payer: Self-pay

## 2020-03-24 ENCOUNTER — Emergency Department (HOSPITAL_COMMUNITY): Payer: Medicare HMO

## 2020-03-24 DIAGNOSIS — R0602 Shortness of breath: Secondary | ICD-10-CM | POA: Diagnosis not present

## 2020-03-24 DIAGNOSIS — R079 Chest pain, unspecified: Secondary | ICD-10-CM | POA: Diagnosis not present

## 2020-03-24 DIAGNOSIS — I480 Paroxysmal atrial fibrillation: Secondary | ICD-10-CM | POA: Diagnosis not present

## 2020-03-24 DIAGNOSIS — R002 Palpitations: Secondary | ICD-10-CM | POA: Diagnosis present

## 2020-03-24 DIAGNOSIS — Z79899 Other long term (current) drug therapy: Secondary | ICD-10-CM | POA: Diagnosis not present

## 2020-03-24 DIAGNOSIS — I10 Essential (primary) hypertension: Secondary | ICD-10-CM | POA: Insufficient documentation

## 2020-03-24 DIAGNOSIS — E876 Hypokalemia: Secondary | ICD-10-CM | POA: Insufficient documentation

## 2020-03-24 DIAGNOSIS — I499 Cardiac arrhythmia, unspecified: Secondary | ICD-10-CM | POA: Diagnosis not present

## 2020-03-24 DIAGNOSIS — R0789 Other chest pain: Secondary | ICD-10-CM | POA: Diagnosis not present

## 2020-03-24 DIAGNOSIS — R000 Tachycardia, unspecified: Secondary | ICD-10-CM | POA: Diagnosis not present

## 2020-03-24 DIAGNOSIS — I4891 Unspecified atrial fibrillation: Secondary | ICD-10-CM | POA: Diagnosis not present

## 2020-03-24 DIAGNOSIS — R0902 Hypoxemia: Secondary | ICD-10-CM | POA: Diagnosis not present

## 2020-03-24 LAB — URINALYSIS, ROUTINE W REFLEX MICROSCOPIC
Bilirubin Urine: NEGATIVE
Glucose, UA: NEGATIVE mg/dL
Hgb urine dipstick: NEGATIVE
Ketones, ur: 5 mg/dL — AB
Leukocytes,Ua: NEGATIVE
Nitrite: NEGATIVE
Protein, ur: NEGATIVE mg/dL
Specific Gravity, Urine: 1.008 (ref 1.005–1.030)
pH: 8 (ref 5.0–8.0)

## 2020-03-24 LAB — TROPONIN I (HIGH SENSITIVITY): Troponin I (High Sensitivity): 16 ng/L

## 2020-03-24 LAB — BASIC METABOLIC PANEL
Anion gap: 10 (ref 5–15)
BUN: 10 mg/dL (ref 8–23)
CO2: 23 mmol/L (ref 22–32)
Calcium: 8.6 mg/dL — ABNORMAL LOW (ref 8.9–10.3)
Chloride: 106 mmol/L (ref 98–111)
Creatinine, Ser: 0.83 mg/dL (ref 0.44–1.00)
GFR calc Af Amer: >60 mL/min (ref >60)
GFR calc non Af Amer: >60 mL/min (ref >60)
Glucose, Bld: 115 mg/dL — ABNORMAL HIGH (ref 70–99)
Potassium: 3.3 mmol/L — ABNORMAL LOW (ref 3.5–5.1)
Sodium: 139 mmol/L (ref 135–145)

## 2020-03-24 LAB — CBC
HCT: 40.8 % (ref 36.0–46.0)
Hemoglobin: 12.8 g/dL (ref 12.0–15.0)
MCH: 29.1 pg (ref 26.0–34.0)
MCHC: 31.4 g/dL (ref 30.0–36.0)
MCV: 92.7 fL (ref 80.0–100.0)
Platelets: 168 10*3/uL (ref 150–400)
RBC: 4.4 MIL/uL (ref 3.87–5.11)
RDW: 12.8 % (ref 11.5–15.5)
WBC: 4.3 10*3/uL (ref 4.0–10.5)
nRBC: 0 % (ref 0.0–0.2)

## 2020-03-24 LAB — TSH: TSH: 5.848 u[IU]/mL — ABNORMAL HIGH (ref 0.350–4.500)

## 2020-03-24 LAB — MAGNESIUM: Magnesium: 1.9 mg/dL (ref 1.7–2.4)

## 2020-03-24 LAB — CBG MONITORING, ED: Glucose-Capillary: 100 mg/dL — ABNORMAL HIGH (ref 70–99)

## 2020-03-24 MED ORDER — SODIUM CHLORIDE 0.9% FLUSH: 3.0000 mL | Freq: Once | INTRAVENOUS | Status: DC

## 2020-03-24 MED ORDER — POTASSIUM CHLORIDE 10 MEQ/100ML IV SOLN
10.0000 meq | Freq: Once | INTRAVENOUS | Status: AC
  Administered 2020-03-24: 10 meq via INTRAVENOUS
  Filled 2020-03-24: qty 100

## 2020-03-24 MED ORDER — POTASSIUM CHLORIDE CRYS ER 20 MEQ PO TBCR
40.0000 meq | EXTENDED_RELEASE_TABLET | Freq: Once | ORAL | Status: AC
  Administered 2020-03-24: 40 meq via ORAL
  Filled 2020-03-24: qty 2

## 2020-03-24 MED ORDER — MAGNESIUM SULFATE IN D5W 1-5 GM/100ML-% IV SOLN
1.0000 g | Freq: Once | INTRAVENOUS | Status: AC
  Administered 2020-03-24: 1 g via INTRAVENOUS
  Filled 2020-03-24: qty 100

## 2020-03-24 NOTE — ED Provider Notes (Signed)
TIME SEEN: 5:31 AM  CHIEF COMPLAINT: Atrial fibrillation  HPI: Patient is an 86 year old female with history of atrial fibrillation on Cardizem and Tikosyn, hypertension on lisinopril who presents to the emergency department with complaints of "fibrillation".  She states that she has had palpitations since yesterday afternoon.  She thought these would go away on their own.  She has had some central chest pressure and mild shortness of breath.  No recent fevers, cough, vomiting, diarrhea.  No calf swelling or calf tenderness.  EMS found patient to be in atrial fibrillation with RVR.  Gave a total of 20 mg of IV Cardizem and a 500 mg IV fluid bolus.  Now appears to be in a sinus rhythm and is asymptomatic.  ROS: See HPI Constitutional: no fever  Eyes: no drainage  ENT: no runny nose   Cardiovascular:   chest pain  Resp:  SOB  GI: no vomiting GU: no dysuria Integumentary: no rash  Allergy: no hives  Musculoskeletal: no leg swelling  Neurological: no slurred speech ROS otherwise negative  PAST MEDICAL HISTORY/PAST SURGICAL HISTORY:  Past Medical History:  Diagnosis Date  . Atrial fibrillation ([place])    maintained in sinus on Flecainide  . Cataract   . ELECTROCARDIOGRAM, ABNORMAL   . HTN (hypertension)   . Rosacea     MEDICATIONS:  Prior to Admission medications   Medication Sig Start Date End Date Taking? Authorizing Provider  cholecalciferol (VITAMIN D) 1000 units tablet Take 1,000 Units by mouth daily.    [provider]  diltiazem (CARDIZEM) 30 MG tablet Take 1 tablet every 4 hours AS NEEDED for AFIB heart rate >100 as long as blood pressure >100. 03/22/18   Sherran Needs, NP  dofetilide (TIKOSYN) 250 MCG capsule Take 1 capsule (250 mcg total) by mouth 2 (two) times daily. 02/24/20   Sherran Needs, NP  KLOR-CON M20 20 MEQ tablet TAKE ONE TABLET BY MOUTH DAILY 10/28/19   Sherran Needs, NP  lisinopril (ZESTRIL) 20 MG tablet Take 1 tablet (20 mg total) by mouth daily.  06/09/19   Sherran Needs, NP  vitamin B-12 (CYANOCOBALAMIN) 100 MCG tablet Take 100 mcg by mouth daily.    [provider]    ALLERGIES:  No Known Allergies  SOCIAL HISTORY:  Social History   Tobacco Use  . Smoking status: Never Smoker  . Smokeless tobacco: Never Used  Substance Use Topics  . Alcohol use: No    FAMILY HISTORY: Family History  Problem Relation Age of Onset  . Heart Problems Mother     EXAM: Temp (!) 95.4 F (35.2 C) (Tympanic)   Ht 5\' 6"  (1.676 m)   Wt 63 kg   BMI 22.44 kg/m  CONSTITUTIONAL: Alert and oriented and responds appropriately to questions. Well-appearing; well-nourished HEAD: Normocephalic EYES: Conjunctivae clear, pupils appear equal, EOM appear intact ENT: normal nose; moist mucous membranes NECK: Supple, normal ROM CARD: RRR; S1 and S2 appreciated; no murmurs, no clicks, no rubs, no gallops RESP: Normal chest excursion without splinting or tachypnea; breath sounds clear and equal bilaterally; no wheezes, no rhonchi, no rales, no hypoxia or respiratory distress, speaking full sentences ABD/GI: Normal bowel sounds; non-distended; soft, non-tender, no rebound, no guarding, no peritoneal signs, no hepatosplenomegaly BACK:  The back appears normal EXT: Normal ROM in all joints; no deformity noted, no edema; no cyanosis, no calf tenderness or calf swelling SKIN: Normal color for age and race; warm; no rash on exposed skin NEURO: Moves all extremities  equally, normal speech, no facial asymmetry PSYCH: The patient's mood and manner are appropriate.   MEDICAL DECISION MAKING: Patient with A. fib with RVR with EMS.  Initial EKG appears to be irregular but does have multiple PVCs.  No new ischemic change seen.  On my evaluation in the room however patient appears to be in a sinus rhythm and is asymptomatic.  She reports compliance with her Tikosyn and diltiazem.  States she cannot take anticoagulation due to significant nosebleeds.  Her  CHA2DS2-VASc 2 score is 4.  Will repeat EKG and obtain labs.  Will check electrolyte levels, TSH.  Given she did have some chest pressure although likely secondary to her arrhythmia, will obtain troponin.  We will also check urinalysis.  ED PROGRESS: Labs, urine pending.  Signed out to oncoming ED physician.  Anticipate if work-up is unremarkable and patient continues to be rate controlled and asymptomatic but she will be discharged home with cardiology follow-up.  States she is seen by [facility] health cardiology.   I reviewed all nursing notes and pertinent previous records as available.  I have interpreted any EKGs, lab and urine results, imaging (as available).     EKG Interpretation  Date/Time:  Wednesday March 24 2020 05:28:13 EDT Ventricular Rate:  119 PR Interval:    QRS Duration: 124 QT Interval:  384 QTC Calculation: 541 R Axis:   25 Text Interpretation: Atrial fibrillation Multiple ventricular premature complexes Left bundle branch block Confirmed by Rochele Raring [phone]) on 03/24/2020 5:49:29 AM       EKG Interpretation  Date/Time:  Wednesday March 24 2020 06:32:12 EDT Ventricular Rate:  58 PR Interval:    QRS Duration: 97 QT Interval:  465 QTC Calculation: 457 R Axis:   43 Text Interpretation: Sinus rhythm Abnrm T, consider ischemia, anterolateral lds similar to previous EKGs A fib has resolved Confirmed by Rochele Raring [phone]) on 03/24/2020 6:36:41 AM         Amanda Olsen was evaluated in Emergency Department on 03/24/2020 for the symptoms described in the history of present illness. She was evaluated in the context of the global COVID-19 pandemic, which necessitated consideration that the patient might be at risk for infection with the SARS-CoV-2 virus that causes COVID-19. Institutional protocols and algorithms that pertain to the evaluation of patients at risk for COVID-19 are in a state of rapid change based on information released by regulatory bodies including  the CDC and federal and state organizations. These policies and algorithms were followed during the patient's care in the ED.      Jules Baty, Layla Maw, DO 03/24/20 [phone]

## 2020-03-24 NOTE — ED Notes (Signed)
Ambulated to restroom  

## 2020-03-24 NOTE — ED Triage Notes (Signed)
Per EMS, pt from home, about 4pm yesterday she began to have a "weird feeling....palpentations." Found to be in Afib w/ RVR.  Given 10mg  cardizem, 80-120bpm, second 10mg  50-100bpm, also given 500mg  Bolus.  States she feels much better

## 2020-03-24 NOTE — Consult Note (Addendum)
Cardiology Consultation:   Patient ID: DYNASTEE BRUMMELL; [id number]; May 19, 1934   Admit date: 03/24/2020 Date of Consult: 03/24/2020  Primary Care Provider: [name], DO Primary Cardiologist: Charlton Haws, MD 10/29/2017 Primary Electrophysiologist:  Regan Lemming, MD 02/15/2018 D. Noralyn Pick, NP 06/04/2019   Patient Profile:   Amanda Olsen is a 86 y.o. female with a hx of Afib on Tikosyn (infrequent breakthroughs), off Eliquis due to Epistaxis, HTN, who is being seen today for the evaluation of PAF  at the request of Dr Juleen China.  History of Present Illness:   Ms. Lynk has been doing well since she was last seen in the A. fib clinic 01/2019.  She had not had an episode of atrial fibrillation in at least 6 months.  Normally, when she gets atrial fibrillation she Kihanna Kamiya feel her heart speed up and go fast.  The symptoms Tesia Lybrand last for a few hours and then resolve spontaneously.  Yesterday, she felt her heart rate speed up and she knew she was back in atrial fibrillation.  She was a little uncomfortable with this, but no specific complaints of chest pain or shortness of breath.  She did not feel weak or tired.  She has not missed any doses of her Tikosyn, and took it both last night and this morning.  She is not anticoagulated.  The as needed Cardizem did not convert her.  Today, when her symptoms still had not improved, she came to the emergency room.  In the emergency room, she spontaneously converted to sinus rhythm.  She then had another episode of atrial fibrillation but again converted to sinus rhythm.  Currently, she is in sinus bradycardia with a heart rate in the mid 50s.  At home, her heart rate sometimes drops into the 50s, but generally is 60 or better.    She notes that she has been having some leg cramps.  She has not had any acute illnesses or problems.  She has not missed any doses of her Tikosyn, she is very compliant with it. However, she has not been taking her  potassium. She is willing to be more compliant with it.    Past Medical History:  Diagnosis Date   Atrial fibrillation (HCC)    mostly in SR on Tikosyn   Cataract    HTN (hypertension)    Rosacea     Past Surgical History:  Procedure Laterality Date   VARICOSE VEIN SURGERY       Prior to Admission medications   Medication Sig Start Date End Date Taking? Authorizing Provider  Cholecalciferol (VITAMIN D-3) 25 MCG (1000 UT) CAPS Take 1,000 Units by mouth daily.    Yes [provider]  dofetilide (TIKOSYN) 250 MCG capsule Take 1 capsule (250 mcg total) by mouth 2 (two) times daily. 02/24/20  Yes Newman Nip, NP  KLOR-CON M20 20 MEQ tablet TAKE ONE TABLET BY MOUTH DAILY Patient taking differently: Take 20 mEq by mouth daily as needed ("for low potassium").  10/28/19  Yes Newman Nip, NP  lisinopril (ZESTRIL) 20 MG tablet Take 1 tablet (20 mg total) by mouth daily. Patient taking differently: Take 20 mg by mouth daily as needed ("for a Systolic reading of 170 or greater").  06/09/19  Yes Newman Nip, NP  MAGNESIUM PO Take 1 tablet by mouth daily as needed ("for low magnesium").   Yes [provider]  vitamin B-12 (CYANOCOBALAMIN) 100 MCG tablet Take 100 mcg by mouth daily.   Yes [provider]  diltiazem (CARDIZEM) 30 MG tablet Take 1 tablet every 4 hours AS NEEDED for AFIB heart rate >100 as long as blood pressure >100. Patient not taking: Reported on 03/24/2020 03/22/18   Newman Nip, NP    Inpatient Medications: Scheduled Meds:  potassium chloride  40 mEq Oral Once   sodium chloride flush  3 mL Intravenous Once   Continuous Infusions:  magnesium sulfate bolus IVPB     PRN Meds:   Allergies:   No Known Allergies  Social History:   Social History   Socioeconomic History   Marital status: Widowed    Spouse name: Not on file   Number of children: Not on file   Years of education: Not on file   Highest education  level: Not on file  Occupational History   Not on file  Tobacco Use   Smoking status: Never Smoker   Smokeless tobacco: Never Used  Substance and Sexual Activity   Alcohol use: No   Drug use: No   Sexual activity: Not Currently  Other Topics Concern   Not on file  Social History Narrative   Not on file   Social Determinants of Health   Financial Resource Strain:    Difficulty of Paying Living Expenses:   Food Insecurity:    Worried About Running Out of Food in the Last Year:    Barista in the Last Year:   Transportation Needs:    Freight forwarder (Medical):    Lack of Transportation (Non-Medical):   Physical Activity:    Days of Exercise per Week:    Minutes of Exercise per Session:   Stress:    Feeling of Stress :   Social Connections:    Frequency of Communication with Friends and Family:    Frequency of Social Gatherings with Friends and Family:    Attends Religious Services:    Active Member of Clubs or Organizations:    Attends Engineer, structural:    Marital Status:   Intimate Partner Violence:    Fear of Current or Ex-Partner:    Emotionally Abused:    Physically Abused:    Sexually Abused:     Family History:   Family History  Problem Relation Age of Onset   Heart Problems Mother    Family Status:  Family Status  Relation Name Status   Mother  Deceased at age 58       heart problems   Father  Deceased at age 107   MGM  Deceased   MGF  Deceased   PGM  Deceased   PGF  Deceased    ROS:  Please see the history of present illness.  All other ROS reviewed and negative.     Physical Exam/Data:   Vitals:   03/24/20 1245 03/24/20 1300 03/24/20 1315 03/24/20 1330  BP: 137/69 139/70 (!) 142/74 (!) 146/73  Pulse: (!) 44 (!) 49 (!) 50 (!) 51  Resp: 11 12 11 16   Temp:      TempSrc:      SpO2: 98% 99% 98% 98%  Weight:      Height:        Intake/Output Summary (Last 24 hours) at 03/24/2020  1433 Last data filed at 03/24/2020 0916 Gross per 24 hour  Intake 100 ml  Output --  Net 100 ml    Last 3 Weights 03/24/2020 10/01/2019 06/04/2019  Weight (lbs) 139 lb 135 lb 9.6 oz 135 lb  Weight (kg) 63.05  kg 61.508 kg 61.236 kg     Body mass index is 22.44 kg/m.   General:  Well nourished, slender, female in no acute distress HEENT: normal Lymph: no adenopathy Neck: JVD -not elevated Endocrine:  No thryomegaly Vascular: No carotid bruits; 4/4 extremity pulses 2+  Cardiac:  normal S1, S2; RRR; no murmur Lungs:  clear bilaterally, no wheezing, rhonchi or rales  Abd: soft, nontender, no hepatomegaly  Ext: no edema Musculoskeletal:  No deformities, BUE and BLE strength normal and equal Skin: warm and dry  Neuro:  CNs 2-12 intact, no focal abnormalities noted Psych:  Normal affect   EKG:  The EKG was personally reviewed and demonstrates:   4/14 at 5:28 AM is atrial fibrillation, heart rate 119, PVCs are noted 4/14 at 0 6:32 AM is sinus bradycardia, heart rate 58 Telemetry:  Telemetry was personally reviewed and demonstrates: Sinus rhythm, sinus bradycardia with intermittent episodes of atrial fibrillation, posttermination pauses up to 5 sec   CV studies:   ECHO: 10/15/2017  - Left ventricle: The cavity size was mildly dilated. Systolic  function was normal. The estimated ejection fraction was in the  range of 55% to 60%. Wall motion was normal; there were no  regional wall motion abnormalities. There was an increased  relative contribution of atrial contraction to ventricular  filling. Doppler parameters are consistent with abnormal left  ventricular relaxation (grade 1 diastolic dysfunction).  - Aortic valve: Trileaflet; mildly thickened, mildly calcified  leaflets.  - Mitral valve: There was mild regurgitation.  - Atrial septum: There was increased thickness of the septum,  consistent with lipomatous hypertrophy.  - Pulmonary arteries: PA peak pressure:  34 mm Hg (S).   Laboratory Data:   Chemistry Recent Labs  Lab 03/24/20 0545  NA 139  K 3.3*  CL 106  CO2 23  GLUCOSE 115*  BUN 10  CREATININE 0.83  CALCIUM 8.6*  GFRNONAA >60  GFRAA >60  ANIONGAP 10    Lab Results  Component Value Date   ALT 26 03/27/2017   AST 35 03/27/2017   ALKPHOS 64 03/27/2017   BILITOT 0.4 03/27/2017   Hematology Recent Labs  Lab 03/24/20 0545  WBC 4.3  RBC 4.40  HGB 12.8  HCT 40.8  MCV 92.7  MCH 29.1  MCHC 31.4  RDW 12.8  PLT 168   Cardiac Enzymes High Sensitivity Troponin:   Recent Labs  Lab 03/24/20 0545  TROPONINIHS 16     TSH:  Lab Results  Component Value Date   TSH 5.848 (H) 03/24/2020   Lipids: Lab Results  Component Value Date   CHOL  07/02/2008    130        ATP III CLASSIFICATION:  <200     mg/dL   Desirable  161-096  mg/dL   Borderline High  >=045    mg/dL   High   HDL 42 40/98/1191   LDLCALC  07/02/2008    71        Total Cholesterol/HDL:CHD Risk Coronary Heart Disease Risk Table                     Men   Women  1/2 Average Risk   3.4   3.3   TRIG 87 07/02/2008   CHOLHDL 3.1 07/02/2008   HgbA1c:No results found for: HGBA1C Magnesium:  Magnesium  Date Value Ref Range Status  03/24/2020 1.9 1.7 - 2.4 mg/dL Final    Comment:    Performed at [facility]  Lab, [address], [place], [place] [address]     Radiology/Studies:  DG Chest Portable 1 View  Result Date: 03/24/2020 CLINICAL DATA:  Chest pain and shortness of breath EXAM: PORTABLE CHEST 1 VIEW COMPARISON:  09/11/2017 FINDINGS: Normal heart size and mediastinal contours. No acute infiltrate or edema. No effusion or pneumothorax. No acute osseous findings. IMPRESSION: No active disease. Electronically Signed   By: Monte Fantasia M.D.   On: 03/24/2020 06:46    Assessment and Plan:   1. PAF:  - Pt spontaneously converted to SR. - Dr Curt Bears discussed options with her, she prefers to stay on the Tikosyn and f/u in the Afib clinic, has  appt next week.  2. Hypokalemia - pt rec'd Kdur 40 meq po and 10 meq IV in the ER - Dr Curt Bears recommends add'l 40 meq Kdur and restart Kdur 20 meq qd. - recheck at f/u appt next week.  3. Pauses - pt w/ post-termination pauses up to 5 sec w/ some escape beats.  - pt otherwise w/out pauses, need to keep her in SR  4. ?VT - could also be Afib w/ rate-related aberrancy - no obvious sx, follow - at this point, does not need event monitor. - if she has more sx, consider it.  5. HTN - BP up today, pt says because of stress of being in ER - Cannot add BB/CCB due to resting bradycardia - she is not taking the lisinopril consistently - reinforced the need to take rx consistently   Principal Problem:   PAF (paroxysmal atrial fibrillation) ([place])     For questions or updates, please contact [place] Please consult [email or web address] for contact info under Cardiology/STEMI.   Signed, Rosaria Ferries, PA-C  03/24/2020 2:33 PM  I have seen and examined this patient with Rosaria Ferries.  Agree with above, note added to reflect my findings.  On exam, RRR, no murmurs, lungs clear.  Patient did into the hospital after multiple episodes of rapid atrial fibrillation.  She currently takes dofetilide.  She presented in atrial fibrillation but converted on her own with postconversion pauses.  She also had wide-complex tachycardias which were irregular which were possibly atrial fibrillation with aberrancy.  Her potassium was low at 3.3 as well as magnesium of 1.9.  She would prefer to continue on her current dose of dofetilide.  Would replete potassium.  Would give 40 mEq more.  Planning to give 1 g of magnesium as well.  She already has follow-up in atrial fibrillation clinic.  No other changes.  Hartford Maulden M. Richanda Darin MD 03/24/2020 2:41 PM

## 2020-03-25 ENCOUNTER — Telehealth (HOSPITAL_COMMUNITY): Payer: Self-pay

## 2020-03-25 NOTE — Telephone Encounter (Signed)
Reached out to patient. Showed up on ED report to offer earlier appointment with Rudi Coco. Left voicemail.

## 2020-04-01 ENCOUNTER — Ambulatory Visit (HOSPITAL_COMMUNITY)
Admission: RE | Admit: 2020-04-01 | Discharge: 2020-04-01 | Disposition: A | Discharge status: Home / Self Care | Payer: Medicare HMO | Source: Ambulatory Visit | Attending: Nurse Practitioner | Admitting: Nurse Practitioner

## 2020-04-01 ENCOUNTER — Encounter (HOSPITAL_COMMUNITY): Payer: Self-pay | Admitting: Nurse Practitioner

## 2020-04-01 ENCOUNTER — Other Ambulatory Visit: Payer: Self-pay

## 2020-04-01 VITALS — BP 170/76 | HR 60 | Ht 66.0 in | Wt 127.6 lb

## 2020-04-01 DIAGNOSIS — I4891 Unspecified atrial fibrillation: Secondary | ICD-10-CM | POA: Diagnosis not present

## 2020-04-01 DIAGNOSIS — E612 Magnesium deficiency: Secondary | ICD-10-CM | POA: Insufficient documentation

## 2020-04-01 DIAGNOSIS — Z79899 Other long term (current) drug therapy: Secondary | ICD-10-CM | POA: Diagnosis not present

## 2020-04-01 DIAGNOSIS — E876 Hypokalemia: Secondary | ICD-10-CM | POA: Insufficient documentation

## 2020-04-01 DIAGNOSIS — D6869 Other thrombophilia: Secondary | ICD-10-CM | POA: Diagnosis not present

## 2020-04-01 DIAGNOSIS — I480 Paroxysmal atrial fibrillation: Secondary | ICD-10-CM | POA: Diagnosis not present

## 2020-04-01 DIAGNOSIS — I10 Essential (primary) hypertension: Secondary | ICD-10-CM | POA: Diagnosis not present

## 2020-04-01 LAB — BASIC METABOLIC PANEL
Anion gap: 8 (ref 5–15)
BUN: 11 mg/dL (ref 8–23)
CO2: 25 mmol/L (ref 22–32)
Calcium: 9.4 mg/dL (ref 8.9–10.3)
Chloride: 100 mmol/L (ref 98–111)
Creatinine, Ser: 0.7 mg/dL (ref 0.44–1.00)
GFR calc Af Amer: >60 mL/min (ref >60)
GFR calc non Af Amer: >60 mL/min (ref >60)
Glucose, Bld: 92 mg/dL (ref 70–99)
Potassium: 4.6 mmol/L (ref 3.5–5.1)
Sodium: 133 mmol/L — ABNORMAL LOW (ref 135–145)

## 2020-04-01 LAB — MAGNESIUM: Magnesium: 2.2 mg/dL (ref 1.7–2.4)

## 2020-04-01 NOTE — Progress Notes (Signed)
Primary Care Physician: Amanda Reichmann, DO Referring Physician: [facility] ER f/u Cardiologist: Dr. Eden Emms EP: Dr. [name] is a 86 y.o. female with a h/o paroxysmal afib in the afib clinic for f/u  tikosyn use. Reports no afib. She feels well. She is not on anticoagulation per her choice as she had repeat nose bleeds despite having cautery x 2. She was told the risks of stroke being off anticoagulation and she was clear she did not want to be on blood thinners any longer.  F/u afib clinic, 4/22 for an ER visit last week for afib with RVR. She spontaneously converted with a cardizem drip. Her K+ was low at 3.3, pt had stopped taking and was placed back on K +. She remains off anticoagulation 2/2 issues with nosebleeds a while back and clear in her decision not to take anticoagulation. She is in SR today.    Today, she denies symptoms of palpitations, chest pain, shortness of breath, orthopnea, PND, lower extremity edema, dizziness, presyncope, syncope, or neurologic sequela. The patient is tolerating medications without difficulties and is otherwise without complaint today.   Past Medical History:  Diagnosis Date  . Atrial fibrillation (HCC)    mostly in SR on Tikosyn  . Cataract   . HTN (hypertension)   . Rosacea    Past Surgical History:  Procedure Laterality Date  . VARICOSE VEIN SURGERY      Current Outpatient Medications  Medication Sig Dispense Refill  . Cholecalciferol (VITAMIN D-3) 25 MCG (1000 UT) CAPS Take 1,000 Units by mouth daily.     Marland Kitchen diltiazem (CARDIZEM) 30 MG tablet Take 1 tablet every 4 hours AS NEEDED for AFIB heart rate >100 as long as blood pressure >100. (Patient not taking: Reported on 03/24/2020) 45 tablet 1  . dofetilide (TIKOSYN) 250 MCG capsule Take 1 capsule (250 mcg total) by mouth 2 (two) times daily. 60 capsule 6  . KLOR-CON M20 20 MEQ tablet TAKE ONE TABLET BY MOUTH DAILY (Patient taking differently: Take 20 mEq by mouth daily as needed  ("for low potassium"). ) 90 tablet 3  . lisinopril (ZESTRIL) 20 MG tablet Take 1 tablet (20 mg total) by mouth daily. (Patient taking differently: Take 20 mg by mouth daily as needed ("for a Systolic reading of 170 or greater"). ) 90 tablet 2  . MAGNESIUM PO Take 1 tablet by mouth daily as needed ("for low magnesium").    . vitamin B-12 (CYANOCOBALAMIN) 100 MCG tablet Take 100 mcg by mouth daily.     No current facility-administered medications for this encounter.    No Known Allergies  Social History   Socioeconomic History  . Marital status: Widowed    Spouse name: Not on file  . Number of children: Not on file  . Years of education: Not on file  . Highest education level: Not on file  Occupational History  . Not on file  Tobacco Use  . Smoking status: Never Smoker  . Smokeless tobacco: Never Used  Substance and Sexual Activity  . Alcohol use: No  . Drug use: No  . Sexual activity: Not Currently  Other Topics Concern  . Not on file  Social History Narrative  . Not on file   Social Determinants of Health   Financial Resource Strain:   . Difficulty of Paying Living Expenses:   Food Insecurity:   . Worried About Programme researcher, broadcasting/film/video in the Last Year:   . Barista in  the Last Year:   Transportation Needs:   . Freight forwarder (Medical):   Marland Kitchen Lack of Transportation (Non-Medical):   Physical Activity:   . Days of Exercise per Week:   . Minutes of Exercise per Session:   Stress:   . Feeling of Stress :   Social Connections:   . Frequency of Communication with Friends and Family:   . Frequency of Social Gatherings with Friends and Family:   . Attends Religious Services:   . Active Member of Clubs or Organizations:   . Attends Banker Meetings:   Marland Kitchen Marital Status:   Intimate Partner Violence:   . Fear of Current or Ex-Partner:   . Emotionally Abused:   Marland Kitchen Physically Abused:   . Sexually Abused:     Family History  Problem Relation Age of  Onset  . Heart Problems Mother     ROS- All systems are reviewed and negative except as per the HPI above  Physical Exam: There were no vitals filed for this visit. Wt Readings from Last 3 Encounters:  03/24/20 139 lb (63 kg)  10/01/19 135 lb 9.6 oz (61.5 kg)  06/04/19 135 lb (61.2 kg)    Labs: Lab Results  Component Value Date   NA 139 03/24/2020   K 3.3 (L) 03/24/2020   CL 106 03/24/2020   CO2 23 03/24/2020   GLUCOSE 115 (H) 03/24/2020   BUN 10 03/24/2020   CREATININE 0.83 03/24/2020   CALCIUM 8.6 (L) 03/24/2020   MG 1.9 03/24/2020   Lab Results  Component Value Date   INR 4.4 (H) 05/03/2009   Lab Results  Component Value Date   CHOL  07/02/2008    130        ATP III CLASSIFICATION:  <200     mg/dL   Desirable  831-517  mg/dL   Borderline High  >=616    mg/dL   High   HDL 42 07/37/1062   LDLCALC  07/02/2008    71        Total Cholesterol/HDL:CHD Risk Coronary Heart Disease Risk Table                     Men   Women  1/2 Average Risk   3.4   3.3   TRIG 87 07/02/2008     GEN- The patient is well appearing, alert and oriented x 3 today.   Head- normocephalic, atraumatic Eyes-  Sclera clear, conjunctiva pink Ears- hearing intact Oropharynx- clear Neck- supple, no JVP Lymph- no cervical lymphadenopathy Lungs- Clear to ausculation bilaterally, normal work of breathing Heart- Regular rate and rhythm, no murmurs, rubs or gallops, PMI not laterally displaced GI- soft, NT, ND, + BS Extremities- no clubbing, cyanosis, or edema MS- no significant deformity or atrophy Skin- no rash or lesion Psych- euthymic mood, full affect Neuro- strength and sensation are intact  EKG-NSR at 60 bpm, pr int 194 ms, qrs int 86 ms, qtc 428 ms    Assessment and Plan: 1. Afib  Staying in SR, occasional breakthrough Recent ER visi as 30 mg Cardizem did not convert pt and she is not on  anticoagulation  Continue Tikosyn 250 mcg and Cardizem 30 mg prn Chadsvasc score of at  least 4, has had 2 significant nosebleeds that ENT has not been able to identify a source Pt is clear in her decision, knowing stroke risk, that she will no longer take anticoagulation Bmet/mag today, K+ in ER was 3.3  2. LV dysfunction EF returned to normal with SR   Afib clinic in 6 months  Amanda Olsen, [facility] [address] [place], [place] [address] [phone]

## 2020-04-20 DIAGNOSIS — H1131 Conjunctival hemorrhage, right eye: Secondary | ICD-10-CM | POA: Diagnosis not present

## 2020-04-20 DIAGNOSIS — Z961 Presence of intraocular lens: Secondary | ICD-10-CM | POA: Diagnosis not present

## 2020-05-04 ENCOUNTER — Observation Stay (HOSPITAL_COMMUNITY)
Admission: EM | Admit: 2020-05-04 | Discharge: 2020-05-05 | Disposition: A | Discharge status: Home / Self Care | Payer: Medicare HMO | Attending: Cardiology | Admitting: Cardiology

## 2020-05-04 ENCOUNTER — Encounter (HOSPITAL_COMMUNITY): Payer: Self-pay | Admitting: Emergency Medicine

## 2020-05-04 DIAGNOSIS — E039 Hypothyroidism, unspecified: Secondary | ICD-10-CM | POA: Insufficient documentation

## 2020-05-04 DIAGNOSIS — I4891 Unspecified atrial fibrillation: Principal | ICD-10-CM | POA: Insufficient documentation

## 2020-05-04 DIAGNOSIS — Z79899 Other long term (current) drug therapy: Secondary | ICD-10-CM | POA: Insufficient documentation

## 2020-05-04 DIAGNOSIS — D649 Anemia, unspecified: Secondary | ICD-10-CM | POA: Diagnosis not present

## 2020-05-04 DIAGNOSIS — Z20822 Contact with and (suspected) exposure to covid-19: Secondary | ICD-10-CM | POA: Diagnosis not present

## 2020-05-04 DIAGNOSIS — R002 Palpitations: Secondary | ICD-10-CM | POA: Diagnosis not present

## 2020-05-04 DIAGNOSIS — I10 Essential (primary) hypertension: Secondary | ICD-10-CM | POA: Diagnosis not present

## 2020-05-04 DIAGNOSIS — R0602 Shortness of breath: Secondary | ICD-10-CM

## 2020-05-04 DIAGNOSIS — Z03818 Encounter for observation for suspected exposure to other biological agents ruled out: Secondary | ICD-10-CM | POA: Diagnosis not present

## 2020-05-04 DIAGNOSIS — R52 Pain, unspecified: Secondary | ICD-10-CM | POA: Diagnosis not present

## 2020-05-04 DIAGNOSIS — I499 Cardiac arrhythmia, unspecified: Secondary | ICD-10-CM | POA: Diagnosis not present

## 2020-05-04 DIAGNOSIS — R000 Tachycardia, unspecified: Secondary | ICD-10-CM | POA: Diagnosis not present

## 2020-05-04 MED ORDER — DILTIAZEM LOAD VIA INFUSION
10.0000 mg | Freq: Once | INTRAVENOUS | Status: AC
  Administered 2020-05-04: 10 mg via INTRAVENOUS
  Filled 2020-05-04: qty 10

## 2020-05-04 MED ORDER — DILTIAZEM HCL-DEXTROSE 125-5 MG/125ML-% IV SOLN (PREMIX)
5.0000 mg/h | INTRAVENOUS | Status: DC
  Administered 2020-05-04: 5 mg/h via INTRAVENOUS
  Filled 2020-05-04 (×3): qty 125

## 2020-05-04 NOTE — ED Triage Notes (Signed)
Pt arrives via EMS from home with reports of palpitations and hx of Afib RVR. Pt found to be in afib RVR, 10 mg cardizem given around 18:12. HR from 160s to 120s.

## 2020-05-04 NOTE — ED Provider Notes (Signed)
[facility] EMERGENCY DEPARTMENT Provider Note   CSN: [id number] Arrival date & time: 05/04/20  1859     History Chief Complaint  Patient presents with  . Atrial Fibrillation    Amanda Olsen is a 86 y.o. female.  HPI     86yo female with history of atrial fibrillation not on anticoagulation due to nosebleeds, hypertension, presents with concern for palpitations.   In April had an episode Today felt palpitations, heart racing No pain, no difficulty breathing, no syncope or lightheadedness Has been taking medications, not on blood thinners- took them but had bleeding nose so stopped Has been cardioverted in the past-had nausea as side effect and does not desire cardioversion  Reports feeling improved now but noted to still be in afib  Past Medical History:  Diagnosis Date  . Atrial fibrillation (HCC)    mostly in SR on Tikosyn  . Cataract   . HTN (hypertension)   . Rosacea     Patient Active Problem List   Diagnosis Date Noted  . Atrial fibrillation with rapid ventricular response (HCC) 05/05/2020  . Visit for monitoring Tikosyn therapy 07/09/2017  . Hypothyroid 11/16/2011  . ELECTROCARDIOGRAM, ABNORMAL 05/02/2010  . ESSENTIAL HYPERTENSION, BENIGN 04/15/2009  . ROSACEA 04/15/2009  . PAF (paroxysmal atrial fibrillation) (HCC) 10/16/2008    Past Surgical History:  Procedure Laterality Date  . VARICOSE VEIN SURGERY       OB History   No obstetric history on file.     Family History  Problem Relation Age of Onset  . Heart Problems Mother     Social History   Tobacco Use  . Smoking status: Never Smoker  . Smokeless tobacco: Never Used  Substance Use Topics  . Alcohol use: No  . Drug use: No    Home Medications Prior to Admission medications   Medication Sig Start Date End Date Taking? Authorizing Provider  Cholecalciferol (VITAMIN D-3) 25 MCG (1000 UT) CAPS Take 1,000 Units by mouth daily.    Yes [provider]    diltiazem (CARDIZEM) 30 MG tablet Take 1 tablet every 4 hours AS NEEDED for AFIB heart rate >100 as long as blood pressure >100. 03/22/18  Yes Newman Nip, NP  dofetilide (TIKOSYN) 250 MCG capsule Take 1 capsule (250 mcg total) by mouth 2 (two) times daily. 02/24/20  Yes Newman Nip, NP  KLOR-CON M20 20 MEQ tablet TAKE ONE TABLET BY MOUTH DAILY Patient taking differently: Take 10 mEq by mouth 2 (two) times daily.  10/28/19  Yes Newman Nip, NP  lisinopril (ZESTRIL) 20 MG tablet Take 1 tablet (20 mg total) by mouth daily. Patient taking differently: Take 10-20 mg by mouth daily as needed ("for a Systolic reading of 170 or greater"). When blood pressure is "low" only take 1/2 tablet. 06/09/19  Yes Newman Nip, NP  MAGNESIUM PO Take 1 tablet by mouth daily as needed ("for low magnesium").   Yes [provider]  vitamin B-12 (CYANOCOBALAMIN) 100 MCG tablet Take 100 mcg by mouth daily.   Yes [provider]    Allergies    Patient has no known allergies.  Review of Systems   Review of Systems  Constitutional: Negative for fever.  HENT: Negative for sore throat.   Eyes: Negative for visual disturbance.  Respiratory: Negative for cough and shortness of breath.   Cardiovascular: Positive for palpitations. Negative for chest pain.  Gastrointestinal: Negative for abdominal pain, constipation, diarrhea, nausea and vomiting.  Genitourinary:  Negative for difficulty urinating.  Musculoskeletal: Negative for back pain and neck pain.  Skin: Negative for rash.  Neurological: Negative for syncope and headaches.    Physical Exam Updated Vital Signs BP 116/69 (BP Location: Left Arm)   Pulse 69   Temp 98.5 F (36.9 C) (Oral)   Resp 20   Ht 5\' 6"  (1.676 m)   Wt 53.1 kg   SpO2 97%   BMI 18.90 kg/m   Physical Exam Vitals and nursing note reviewed.  Constitutional:      General: She is not in acute distress.    Appearance: She is well-developed. She is not  diaphoretic.  HENT:     Head: Normocephalic and atraumatic.  Eyes:     Conjunctiva/sclera: Conjunctivae normal.  Cardiovascular:     Rate and Rhythm: Tachycardia present. Rhythm irregular.     Heart sounds: Normal heart sounds. No murmur. No friction rub. No gallop.   Pulmonary:     Effort: Pulmonary effort is normal. No respiratory distress.     Breath sounds: Normal breath sounds. No wheezing or rales.  Abdominal:     General: There is no distension.     Palpations: Abdomen is soft.     Tenderness: There is no abdominal tenderness. There is no guarding.  Musculoskeletal:        General: No tenderness.     Cervical back: Normal range of motion.  Skin:    General: Skin is warm and dry.     Findings: No erythema or rash.  Neurological:     Mental Status: She is alert and oriented to person, place, and time.     ED Results / Procedures / Treatments   Labs (all labs ordered are listed, but only abnormal results are displayed) Labs Reviewed  CBC WITH DIFFERENTIAL/PLATELET - Abnormal; Notable for the following components:      Result Value   Hemoglobin 11.7 (*)    HCT 35.2 (*)    All other components within normal limits  COMPREHENSIVE METABOLIC PANEL - Abnormal; Notable for the following components:   Glucose, Bld 115 (*)    BUN <5 (*)    Calcium 8.7 (*)    Total Protein 6.1 (*)    Albumin 3.3 (*)    All other components within normal limits  HEPATIC FUNCTION PANEL - Abnormal; Notable for the following components:   Total Protein 6.4 (*)    Albumin 3.3 (*)    All other components within normal limits  BASIC METABOLIC PANEL - Abnormal; Notable for the following components:   Potassium 3.4 (*)    BUN <5 (*)    All other components within normal limits  CBC - Abnormal; Notable for the following components:   WBC 3.8 (*)    All other components within normal limits  SARS CORONAVIRUS 2 BY RT PCR (HOSPITAL ORDER, PERFORMED IN [facility])  MAGNESIUM  TSH    PROTIME-INR    EKG EKG Interpretation  Date/Time:  Wednesday May 05 2020 05:31:35 EDT Ventricular Rate:  69 PR Interval:    QRS Duration: 92 QT Interval:  394 QTC Calculation: 407 R Axis:   7 Text Interpretation: Atrial fibrillation Ventricular premature complex Abnormal T, consider ischemia, diffuse leads Confirmed by 01-09-2007 (Zadie Rhine) on 05/05/2020 5:51:30 AM   Radiology Portable chest x-ray 1 view  Result Date: 05/05/2020 CLINICAL DATA:  Shortness of breath. Palpitations. Atrial fibrillation with RVR. EXAM: PORTABLE CHEST 1 VIEW COMPARISON:  09/11/2017 FINDINGS: Low lung volumes.  Normal heart size. Aortic tortuosity and atherosclerosis. No pulmonary edema. No focal airspace disease. No pleural fluid. No pneumothorax. Bones diffusely under mineralized. IMPRESSION: 1. Low lung volumes without acute abnormality. 2. Aortic tortuosity and atherosclerosis. Aortic Atherosclerosis (ICD10-I70.0). Electronically Signed   By: Keith Rake M.D.   On: 05/05/2020 03:03    Procedures .Critical Care Performed by: Gareth Morgan, MD Authorized by: Gareth Morgan, MD   Critical care provider statement:    Critical care time (minutes):  45   Critical care was time spent personally by me on the following activities:  Discussions with consultants, evaluation of patient's response to treatment, examination of patient, ordering and performing treatments and interventions, ordering and review of laboratory studies, ordering and review of radiographic studies, pulse oximetry, re-evaluation of patient's condition, obtaining history from patient or surrogate and review of old charts   (including critical care time)  Medications Ordered in ED Medications  diltiazem (CARDIZEM) 1 mg/mL load via infusion 10 mg (10 mg Intravenous Bolus from Bag 05/04/20 2029)    And  diltiazem (CARDIZEM) 125 mg in dextrose 5% 125 mL (1 mg/mL) infusion (0 mg/hr Intravenous Stopping Infusion hung by another  clincian 05/05/20 0730)  acetaminophen (TYLENOL) tablet 650 mg (has no administration in time range)  ondansetron (ZOFRAN) injection 4 mg (has no administration in time range)  dofetilide (TIKOSYN) capsule 250 mcg (250 mcg Oral Given 05/05/20 0538)  vitamin B-12 (CYANOCOBALAMIN) tablet 100 mcg (100 mcg Oral Not Given 05/05/20 0906)  cholecalciferol (VITAMIN D3) tablet 1,000 Units (1,000 Units Oral Not Given 05/05/20 0904)  potassium chloride SA (KLOR-CON) CR tablet 20 mEq (20 mEq Oral Not Given 05/05/20 3532)    ED Course  I have reviewed the triage vital signs and the nursing notes.  Pertinent labs & imaging results that were available during my care of the patient were reviewed by me and considered in my medical decision making (see chart for details).    MDM Rules/Calculators/A&P                      86yo female with history of atrial fibrillation not on anticoagulation due to nosebleeds, hypertension, presents with concern for palpitations.   EKG shows atrial fibrillation with aberrancy. Given diltiazem bolus and gtt with some improvement in HR however continuing afib with rates ranging from 100s-130s while on diltiazem gtt.  Not a good candidate for cardioversion-symptoms began at 4 but she also notes improvement in symptoms but is still afib and unclear reliability of timing of afib. Spoke with Cardiology on call.  Plan to admit for afib RVR however labs pending at this time.   Final Clinical Impression(s) / ED Diagnoses Final diagnoses:  Atrial fibrillation with RVR (HCC)  Normochromic normocytic anemia    Rx / DC Orders ED Discharge Orders         Ordered    Amb referral to AFIB Clinic     05/05/20 0243           Gareth Morgan, MD 05/05/20 [phone]

## 2020-05-05 ENCOUNTER — Observation Stay (HOSPITAL_COMMUNITY): Payer: Medicare HMO

## 2020-05-05 ENCOUNTER — Observation Stay (HOSPITAL_BASED_OUTPATIENT_CLINIC_OR_DEPARTMENT_OTHER): Payer: Medicare HMO

## 2020-05-05 DIAGNOSIS — I4891 Unspecified atrial fibrillation: Secondary | ICD-10-CM | POA: Diagnosis not present

## 2020-05-05 DIAGNOSIS — R0602 Shortness of breath: Secondary | ICD-10-CM | POA: Diagnosis not present

## 2020-05-05 DIAGNOSIS — I10 Essential (primary) hypertension: Secondary | ICD-10-CM | POA: Diagnosis not present

## 2020-05-05 LAB — BASIC METABOLIC PANEL
Anion gap: 10 (ref 5–15)
BUN: <5 mg/dL — ABNORMAL LOW (ref 8–23)
CO2: 24 mmol/L (ref 22–32)
Calcium: 8.9 mg/dL (ref 8.9–10.3)
Chloride: 105 mmol/L (ref 98–111)
Creatinine, Ser: 0.66 mg/dL (ref 0.44–1.00)
GFR calc Af Amer: >60 mL/min (ref >60)
GFR calc non Af Amer: >60 mL/min (ref >60)
Glucose, Bld: 96 mg/dL (ref 70–99)
Potassium: 3.4 mmol/L — ABNORMAL LOW (ref 3.5–5.1)
Sodium: 139 mmol/L (ref 135–145)

## 2020-05-05 LAB — COMPREHENSIVE METABOLIC PANEL
ALT: 14 U/L (ref 0–44)
AST: 20 U/L (ref 15–41)
Albumin: 3.3 g/dL — ABNORMAL LOW (ref 3.5–5.0)
Alkaline Phosphatase: 44 U/L (ref 38–126)
Anion gap: 9 (ref 5–15)
BUN: <5 mg/dL — ABNORMAL LOW (ref 8–23)
CO2: 23 mmol/L (ref 22–32)
Calcium: 8.7 mg/dL — ABNORMAL LOW (ref 8.9–10.3)
Chloride: 107 mmol/L (ref 98–111)
Creatinine, Ser: 0.6 mg/dL (ref 0.44–1.00)
GFR calc Af Amer: >60 mL/min (ref >60)
GFR calc non Af Amer: >60 mL/min (ref >60)
Glucose, Bld: 115 mg/dL — ABNORMAL HIGH (ref 70–99)
Potassium: 3.6 mmol/L (ref 3.5–5.1)
Sodium: 139 mmol/L (ref 135–145)
Total Bilirubin: 0.4 mg/dL (ref 0.3–1.2)
Total Protein: 6.1 g/dL — ABNORMAL LOW (ref 6.5–8.1)

## 2020-05-05 LAB — CBC WITH DIFFERENTIAL/PLATELET
Abs Immature Granulocytes: 0.02 10*3/uL (ref 0.00–0.07)
Basophils Absolute: 0 10*3/uL (ref 0.0–0.1)
Basophils Relative: 1 %
Eosinophils Absolute: 0.1 10*3/uL (ref 0.0–0.5)
Eosinophils Relative: 2 %
HCT: 35.2 % — ABNORMAL LOW (ref 36.0–46.0)
Hemoglobin: 11.7 g/dL — ABNORMAL LOW (ref 12.0–15.0)
Immature Granulocytes: 0 %
Lymphocytes Relative: 28 %
Lymphs Abs: 1.3 10*3/uL (ref 0.7–4.0)
MCH: 30.2 pg (ref 26.0–34.0)
MCHC: 33.2 g/dL (ref 30.0–36.0)
MCV: 90.7 fL (ref 80.0–100.0)
Monocytes Absolute: 0.5 10*3/uL (ref 0.1–1.0)
Monocytes Relative: 11 %
Neutro Abs: 2.6 10*3/uL (ref 1.7–7.7)
Neutrophils Relative %: 58 %
Platelets: 169 10*3/uL (ref 150–400)
RBC: 3.88 MIL/uL (ref 3.87–5.11)
RDW: 12.9 % (ref 11.5–15.5)
WBC: 4.5 10*3/uL (ref 4.0–10.5)
nRBC: 0 % (ref 0.0–0.2)

## 2020-05-05 LAB — TSH: TSH: 4.065 u[IU]/mL (ref 0.350–4.500)

## 2020-05-05 LAB — HEPATIC FUNCTION PANEL
ALT: 13 U/L (ref 0–44)
AST: 21 U/L (ref 15–41)
Albumin: 3.3 g/dL — ABNORMAL LOW (ref 3.5–5.0)
Alkaline Phosphatase: 46 U/L (ref 38–126)
Bilirubin, Direct: 0.2 mg/dL (ref 0.0–0.2)
Indirect Bilirubin: 0.4 mg/dL (ref 0.3–0.9)
Total Bilirubin: 0.6 mg/dL (ref 0.3–1.2)
Total Protein: 6.4 g/dL — ABNORMAL LOW (ref 6.5–8.1)

## 2020-05-05 LAB — SARS CORONAVIRUS 2 BY RT PCR (HOSPITAL ORDER, PERFORMED IN ~~LOC~~ HOSPITAL LAB): SARS Coronavirus 2: NEGATIVE

## 2020-05-05 LAB — CBC
HCT: 38.1 % (ref 36.0–46.0)
Hemoglobin: 12.2 g/dL (ref 12.0–15.0)
MCH: 29.5 pg (ref 26.0–34.0)
MCHC: 32 g/dL (ref 30.0–36.0)
MCV: 92.3 fL (ref 80.0–100.0)
Platelets: 180 10*3/uL (ref 150–400)
RBC: 4.13 MIL/uL (ref 3.87–5.11)
RDW: 12.9 % (ref 11.5–15.5)
WBC: 3.8 10*3/uL — ABNORMAL LOW (ref 4.0–10.5)
nRBC: 0 % (ref 0.0–0.2)

## 2020-05-05 LAB — PROTIME-INR
INR: 1.1 (ref 0.8–1.2)
Prothrombin Time: 13.4 seconds (ref 11.4–15.2)

## 2020-05-05 LAB — MAGNESIUM: Magnesium: 1.9 mg/dL (ref 1.7–2.4)

## 2020-05-05 LAB — ECHOCARDIOGRAM COMPLETE
Height: 66 in
Weight: 1873.6 oz

## 2020-05-05 MED ORDER — DOFETILIDE 250 MCG PO CAPS
250.0000 ug | ORAL_CAPSULE | Freq: Two times a day (BID) | ORAL | Status: DC
  Administered 2020-05-05: 250 ug via ORAL
  Filled 2020-05-05 (×2): qty 1

## 2020-05-05 MED ORDER — VITAMIN B-12 100 MCG PO TABS
100.0000 ug | ORAL_TABLET | Freq: Every day | ORAL | Status: DC
  Filled 2020-05-05: qty 1

## 2020-05-05 MED ORDER — ACETAMINOPHEN 325 MG PO TABS: 650.0000 mg | ORAL_TABLET | ORAL | Status: DC | PRN

## 2020-05-05 MED ORDER — ONDANSETRON HCL 4 MG/2ML IJ SOLN: 4.0000 mg | Freq: Four times a day (QID) | INTRAMUSCULAR | Status: DC | PRN

## 2020-05-05 MED ORDER — VITAMIN D 25 MCG (1000 UNIT) PO TABS
1000.0000 [IU] | ORAL_TABLET | Freq: Every day | ORAL | Status: DC
  Filled 2020-05-05: qty 1

## 2020-05-05 MED ORDER — POTASSIUM CHLORIDE CRYS ER 20 MEQ PO TBCR
20.0000 meq | EXTENDED_RELEASE_TABLET | Freq: Every day | ORAL | Status: DC
  Filled 2020-05-05: qty 1

## 2020-05-05 MED ORDER — POTASSIUM CHLORIDE CRYS ER 10 MEQ PO TBCR: 40.0000 meq | EXTENDED_RELEASE_TABLET | Freq: Once | ORAL | Status: DC

## 2020-05-05 NOTE — Progress Notes (Signed)
  Echocardiogram 2D Echocardiogram has been performed.  Amanda Olsen 05/05/2020, 12:10 PM

## 2020-05-05 NOTE — Progress Notes (Signed)
Progress Note  Patient Name: Amanda Olsen Date of Encounter: 05/05/2020  Primary Cardiologist: [name], MD  Primary EP: Will Camnitz, MD  Subjective   Spontaneously converted to sinus rhythm. Feels well, wants to go home. Discussed risk of stroke without anticoagulation, she declines and will discuss further with Dr. Curt Bears on follow up.  Inpatient Medications    Scheduled Meds: . cholecalciferol  1,000 Units Oral Daily  . dofetilide  250 mcg Oral BID  . potassium chloride  40 mEq Oral Once  . potassium chloride SA  20 mEq Oral Daily  . vitamin B-12  100 mcg Oral Daily   Continuous Infusions: . diltiazem (CARDIZEM) infusion Stopped (05/05/20 0730)   PRN Meds: acetaminophen, ondansetron (ZOFRAN) IV   Vital Signs    Vitals:   05/05/20 0730 05/05/20 0739 05/05/20 0750 05/05/20 0801  BP: 96/62   116/69  Pulse: (!) 56   69  Resp: 14  14 20   Temp:  98.3 F (36.8 C)  98.5 F (36.9 C)  TempSrc:    Oral  SpO2: 97%   97%  Weight:    53.1 kg  Height:    5\' 6"  (1.676 m)    Intake/Output Summary (Last 24 hours) at 05/05/2020 1055 Last data filed at 05/05/2020 0530 Gross per 24 hour  Intake --  Output 375 ml  Net -375 ml   Last 3 Weights 05/05/2020 05/04/2020 04/01/2020  Weight (lbs) 117 lb 1.6 oz 130 lb 127 lb 9.6 oz  Weight (kg) 53.116 kg 58.968 kg 57.879 kg      Telemetry    Now in sinus rhythm with intermittent PACs and occasional PVCs - Personally Reviewed  ECG    afib RVR on admission, then NSR - Personally Reviewed  Physical Exam   GEN: No acute distress.   Neck: No JVD Cardiac: RRR, no murmurs, rubs, or gallops.  Respiratory: Clear to auscultation bilaterally. GI: Soft, nontender, non-distended  MS: No edema; No deformity. Neuro:  Nonfocal  Psych: Normal affect   Labs    High Sensitivity Troponin:  No results for input(s): TROPONINIHS in the last 720 hours.    Chemistry Recent Labs  Lab 05/04/20 2310 05/05/20 0651 05/05/20 0652   NA 139 139  --   K 3.6 3.4*  --   CL 107 105  --   CO2 23 24  --   GLUCOSE 115* 96  --   BUN <5* <5*  --   CREATININE 0.60 0.66  --   CALCIUM 8.7* 8.9  --   PROT 6.1*  --  6.4*  ALBUMIN 3.3*  --  3.3*  AST 20  --  21  ALT 14  --  13  ALKPHOS 44  --  46  BILITOT 0.4  --  0.6  GFRNONAA >60 >60  --   GFRAA >60 >60  --   ANIONGAP 9 10  --      Hematology Recent Labs  Lab 05/04/20 2310 05/05/20 0651  WBC 4.5 3.8*  RBC 3.88 4.13  HGB 11.7* 12.2  HCT 35.2* 38.1  MCV 90.7 92.3  MCH 30.2 29.5  MCHC 33.2 32.0  RDW 12.9 12.9  PLT 169 180    BNPNo results for input(s): BNP, PROBNP in the last 168 hours.   DDimer No results for input(s): DDIMER in the last 168 hours.   Radiology    Portable chest x-ray 1 view  Result Date: 05/05/2020 CLINICAL DATA:  Shortness of breath. Palpitations. Atrial  fibrillation with RVR. EXAM: PORTABLE CHEST 1 VIEW COMPARISON:  09/11/2017 FINDINGS: Low lung volumes. Normal heart size. Aortic tortuosity and atherosclerosis. No pulmonary edema. No focal airspace disease. No pleural fluid. No pneumothorax. Bones diffusely under mineralized. IMPRESSION: 1. Low lung volumes without acute abnormality. 2. Aortic tortuosity and atherosclerosis. Aortic Atherosclerosis (ICD10-I70.0). Electronically Signed   By: Narda Rutherford M.D.   On: 05/05/2020 03:03    Cardiac Studies   Updated echo pending today  Patient Profile     86 y.o. female hx of Afib on Tikosyn (infrequent breakthroughs), off Eliquis due to Epistaxis, HTN who presents with atrial fibrillation with rapid ventricular response  Assessment & Plan    Atrial fibrillation with RVR: now spontaneously converted to NSR -not on anticoagulation despite tikosyn. Discussed with patient and with Dr. Elberta Fortis. Ok to continue tikosyn per Dr. Elberta Fortis, and he will discuss anticoagulation with her further at follow up -Mg 1.9, K 3.4. Attempting to supplement K, she initially declined -has PRN diltiazem at  home for elevated heart rate -blood pressure has been low to low normal. Will hold lisinopril for now. Will need to monitor BP at home, call if rises off of lisinopril -continue home potassium supplementation. -updating echo today  Plan to discharge home after echo. Given return instructions.  For questions or updates, please contact [facility] Please consult [email or web address] for contact info under     Signed, Jodelle Red, MD  05/05/2020, 10:55 AM

## 2020-05-05 NOTE — Plan of Care (Signed)
  Problem: Education: Goal: Knowledge of disease or condition will improve Outcome: Completed/Met Goal: Understanding of medication regimen will improve Outcome: Completed/Met   Problem: Activity: Goal: Ability to tolerate increased activity will improve Outcome: Completed/Met   Problem: Cardiac: Goal: Ability to achieve and maintain adequate cardiopulmonary perfusion will improve Outcome: Completed/Met   Problem: Health Behavior/Discharge Planning: Goal: Ability to safely manage health-related needs after discharge will improve Outcome: Completed/Met   Problem: Education: Goal: Knowledge of General Education information will improve Description: Including pain rating scale, medication(s)/side effects and non-pharmacologic comfort measures Outcome: Completed/Met   Problem: Health Behavior/Discharge Planning: Goal: Ability to manage health-related needs will improve Outcome: Adequate for Discharge  Pt did not take her medications while in hospital. Per pt she will take it when she gets home Problem: Clinical Measurements: Goal: Ability to maintain clinical measurements within normal limits will improve Outcome: Completed/Met Goal: Will remain free from infection Outcome: Completed/Met Goal: Diagnostic test results will improve Outcome: Completed/Met Goal: Respiratory complications will improve Outcome: Completed/Met Goal: Cardiovascular complication will be avoided Outcome: Completed/Met   Problem: Activity: Goal: Risk for activity intolerance will decrease Outcome: Completed/Met   Problem: Nutrition: Goal: Adequate nutrition will be maintained Outcome: Completed/Met   Problem: Coping: Goal: Level of anxiety will decrease Outcome: Completed/Met   Problem: Elimination: Goal: Will not experience complications related to bowel motility Outcome: Completed/Met Goal: Will not experience complications related to urinary retention Outcome: Completed/Met   Problem: Pain  Managment: Goal: General experience of comfort will improve Outcome: Completed/Met   Problem: Safety: Goal: Ability to remain free from injury will improve Outcome: Completed/Met   Problem: Skin Integrity: Goal: Risk for impaired skin integrity will decrease Outcome: Completed/Met

## 2020-05-05 NOTE — Progress Notes (Signed)
Pt is refusing to take her medications. Per pt it will upset her stomach. RN explained in details the importance of taking the medications especially the potassium. Pt stated she would take the medication tonight after dinner when she gets home.pt's doctor notified this monring around [phone] & again this afternoon around 12:30.  Sanda Linger, RN

## 2020-05-05 NOTE — H&P (Signed)
Cardiology History & Physical    Patient ID: Amanda Olsen MRN: [id number], DOB/AGE: [birth year]   Admit date: 05/04/2020  Primary Physician: [name], DO Primary Cardiologist: Charlton Haws, MD  Patient Profile    Amanda Olsen is a 86 y.o. female with a hx of Afib on Tikosyn (infrequent breakthroughs), off Eliquis due to Epistaxis, HTN who presents with atrial fibrillation with rapid ventricular response.  History of Present Illness    Amanda Olsen is a 86 y.o. female with a hx of Afib on Tikosyn (infrequent breakthroughs), off Eliquis due to Epistaxis, HTN who presents with atrial fibrillation with rapid ventricular response.  She has known pAF and most recently presented 03/2020 with paroxysmal AF that resolved with diltiazem in the ED and did not require admission.  She reports that she was doing well until yesterday when she was doing light kalisthenics (she is fairly active) and had felt herself go into AF.  She seems quite aware of her AF and reports symptoms of fatigue, weakness, and awareness of an elevated heart rate that she can "hear in her ear."  She denies chest pain, shortness of breath, orthopnea, PND, fevers, chills, cough, n/v, dysuria.  She has been adherent to all of her dofetilide and takes diltiazem PRN which did not convert her this time.  She was started on diltiazem infusion in the ED after 10 mg loading bolus and has remained in AF w/ RVR.  She refuses anticoagulation after multiple episodes of epistaxis.  She reports having been on apixaban and had significant nosebleed with this.  She was then trialed on ASA 325 mg with significant epistaxis and does not want anticoagulation going forward.   Past Medical History   Past Medical History:  Diagnosis Date  . Atrial fibrillation (HCC)    mostly in SR on Tikosyn  . Cataract   . HTN (hypertension)   . Rosacea     Past Surgical History:  Procedure Laterality Date  . VARICOSE VEIN SURGERY        Allergies No Known Allergies  Home Medications    Prior to Admission medications   Medication Sig Start Date End Date Taking? Authorizing Provider  Cholecalciferol (VITAMIN D-3) 25 MCG (1000 UT) CAPS Take 1,000 Units by mouth daily.    Yes [provider]  diltiazem (CARDIZEM) 30 MG tablet Take 1 tablet every 4 hours AS NEEDED for AFIB heart rate >100 as long as blood pressure >100. 03/22/18  Yes Newman Nip, NP  dofetilide (TIKOSYN) 250 MCG capsule Take 1 capsule (250 mcg total) by mouth 2 (two) times daily. 02/24/20  Yes Newman Nip, NP  KLOR-CON M20 20 MEQ tablet TAKE ONE TABLET BY MOUTH DAILY Patient taking differently: Take 10 mEq by mouth 2 (two) times daily.  10/28/19  Yes Newman Nip, NP  lisinopril (ZESTRIL) 20 MG tablet Take 1 tablet (20 mg total) by mouth daily. Patient taking differently: Take 10-20 mg by mouth daily as needed ("for a Systolic reading of 170 or greater"). When blood pressure is "low" only take 1/2 tablet. 06/09/19  Yes Newman Nip, NP  MAGNESIUM PO Take 1 tablet by mouth daily as needed ("for low magnesium").   Yes [provider]  vitamin B-12 (CYANOCOBALAMIN) 100 MCG tablet Take 100 mcg by mouth daily.   Yes [provider]    Family History    Family History  Problem Relation Age of Onset  . Heart Problems Mother  She indicated that her mother is deceased. She indicated that her father is deceased. She indicated that her maternal grandmother is deceased. She indicated that her maternal grandfather is deceased. She indicated that her paternal grandmother is deceased. She indicated that her paternal grandfather is deceased.   Social History    Social History   Socioeconomic History  . Marital status: Widowed    Spouse name: Not on file  . Number of children: Not on file  . Years of education: Not on file  . Highest education level: Not on file  Occupational History  . Not on file  Tobacco Use  .  Smoking status: Never Smoker  . Smokeless tobacco: Never Used  Substance and Sexual Activity  . Alcohol use: No  . Drug use: No  . Sexual activity: Not Currently  Other Topics Concern  . Not on file  Social History Narrative  . Not on file   Social Determinants of Health   Financial Resource Strain:   . Difficulty of Paying Living Expenses:   Food Insecurity:   . Worried About Charity fundraiser in the Last Year:   . Arboriculturist in the Last Year:   Transportation Needs:   . Film/video editor (Medical):   Marland Kitchen Lack of Transportation (Non-Medical):   Physical Activity:   . Days of Exercise per Week:   . Minutes of Exercise per Session:   Stress:   . Feeling of Stress :   Social Connections:   . Frequency of Communication with Friends and Family:   . Frequency of Social Gatherings with Friends and Family:   . Attends Religious Services:   . Active Member of Clubs or Organizations:   . Attends Archivist Meetings:   Marland Kitchen Marital Status:   Intimate Partner Violence:   . Fear of Current or Ex-Partner:   . Emotionally Abused:   Marland Kitchen Physically Abused:   . Sexually Abused:      Review of Systems    General:  No chills, fever, night sweats or weight changes.  Cardiovascular:  No chest pain, dyspnea on exertion, edema, orthopnea, palpitations, paroxysmal nocturnal dyspnea. Dermatological: No rash, lesions/masses Respiratory: No cough, dyspnea Urologic: No hematuria, dysuria Abdominal:   No nausea, vomiting, diarrhea, bright red blood per rectum, melena, or hematemesis Neurologic:  No visual changes, wkns, changes in mental status. All other systems reviewed and are otherwise negative except as noted above.  Physical Exam    BP 115/83   Pulse (!) 39   Temp 98 F (36.7 C) (Oral)   Resp 19   Ht 5\' 6"  (1.676 m)   Wt 59 kg   SpO2 97%   BMI 20.98 kg/m  General: Alert, NAD HEENT: Normal  Neck: JVP 7 cm H20 Lungs:  Resp regular and unlabored, CTA  bilaterally. Heart: Regular rhythm, no s3, s4, or murmurs. Abdomen: Soft, non-tender, non-distended, BS +.  Extremities: Warm. No clubbing, cyanosis or edema. DP/PT/Radials 2+ and equal bilaterally. Psych: Normal affect. Neuro: Alert and oriented. No gross focal deficits. No abnormal movements.  Labs   CMP unremarkable Hgb 11.7, normocytic TSH pending SarsCOV2 negative    Radiology Studies    CXR FINDINGS: Low lung volumes. Normal heart size. Aortic tortuosity and atherosclerosis. No pulmonary edema. No focal airspace disease. No pleural fluid. No pneumothorax. Bones diffusely under mineralized.  IMPRESSION: 1. Low lung volumes without acute abnormality. 2. Aortic tortuosity and atherosclerosis. Aortic Atherosclerosis (ICD10-I70.0).  ECG & Cardiac Imaging  EKG: AF w/ controlled ventricular rate, nonspecific lateral ST changes TTE 07/02/17 Study Conclusions   - Left ventricle: Systolic function was mildly to moderately  reduced. The estimated ejection fraction was in the range of 40%  to 45%. Features are consistent with a pseudonormal left  ventricular filling pattern, with concomitant abnormal relaxation  and increased filling pressure (grade 2 diastolic dysfunction).  - Ventricular septum: Septal motion showed paradox. These changes  are consistent with a left bundle branch block.  - Left atrium: The atrium was mildly dilated.  - Pulmonary arteries: Systolic pressure was mildly increased. PA  peak pressure: 39 mm Hg (S).   Assessment & Plan    Ms. Marschner is an 86 year old female with paroxysmal atrial fibrillation presenting with AF w/ RVR that has persisted despite uninterrupted dofetilide in the setting of diltiazem infusion.  I reviewed her stroke risk with her and she is still recalcitrant to anticoagulation.  Despite this, she is quite symptomatic with her AF.  Legrand Rams continued observation with transition to standing oral diltiazem in addition to  her dofetilide once she has converted and can be transitioned to equivalent dosing of her infusion.  Alternative would be dofetilide washout with amiodarone initiation.  She understands that all of this is inadvisable without anticoagulation but is willing to accept risk.  #AF w/ rapid ventricular response not on anticoagulation - Continue dofetilide 250 mg BID - Continue dilitazem infusion; plan to convert to oral once converted - Not on anticoagulation as has been discussed with patient previously. - Repeat echocardiogram  #HTN - Hold home lisinopril to facilitate dilitiazem initiation (no clear alternative indication for ACEi  Nutrition: Regular diet DVT ppx: No anticoagulation as above Advanced Care Planning: Full code  Signed, Regino Schultze, MD 05/05/2020, 2:44 AM

## 2020-05-05 NOTE — ED Notes (Addendum)
Pt noted to be in NSR with irregular rate between 58-71. Cardizem discontinued, EKG fired and shown to EDP Dr Jeraldine Loots, listed doctors for patient messaged.

## 2020-05-05 NOTE — ED Notes (Signed)
ED TO INPATIENT HANDOFF REPORT  ED Nurse Name and Phone #: [phone] [name]., RN  S Name/Age/Gender Amanda Olsen 86 y.o. female Room/Bed: 024C/024C  Code Status   Code Status: Full Code  Home/SNF/Other Home Patient oriented to: self, place, time and situation Is this baseline? Yes   Triage Complete: Triage complete  Chief Complaint Atrial fibrillation with rapid ventricular response ([place]) [I48.91]  Triage Note Pt arrives via EMS from home with reports of palpitations and hx of Afib RVR. Pt found to be in afib RVR, 10 mg cardizem given around 18:12. HR from 160s to 120s.     Allergies No Known Allergies  Level of Care/Admitting Diagnosis ED Disposition    ED Disposition Condition Comment   Admit  Hospital Area: [facility] [[id number]]  Level of Care: Telemetry Cardiac [[id number]]  Covid Evaluation: Asymptomatic Screening Protocol (No Symptoms)  Diagnosis: Atrial fibrillation with rapid ventricular response [facility]) [[id number]]  Admitting Physician: Delight Hoh [[id number]]  Attending Physician: Iantha Fallen       B Medical/Surgery History Past Medical History:  Diagnosis Date  . Atrial fibrillation ([place])    mostly in SR on Tikosyn  . Cataract   . HTN (hypertension)   . Rosacea    Past Surgical History:  Procedure Laterality Date  . VARICOSE VEIN SURGERY       A IV Location/Drains/Wounds Patient Lines/Drains/Airways Status   Active Line/Drains/Airways    Name:   Placement date:   Placement time:   Site:   Days:   Peripheral IV 05/04/20 Right Antecubital   05/04/20    1906    Antecubital   1          Intake/Output Last 24 hours  Intake/Output Summary (Last 24 hours) at 05/05/2020 7353 Last data filed at 05/05/2020 0530 Gross per 24 hour  Intake --  Output 375 ml  Net -375 ml    Labs/Imaging Results for orders placed or performed during the hospital encounter of 05/04/20 (from the past 48 hour(s))  CBC with  Differential     Status: Abnormal   Collection Time: 05/04/20 11:10 PM  Result Value Ref Range   WBC 4.5 4.0 - 10.5 K/uL   RBC 3.88 3.87 - 5.11 MIL/uL   Hemoglobin 11.7 (L) 12.0 - 15.0 g/dL   HCT 35.2 (L) 36.0 - 46.0 %   MCV 90.7 80.0 - 100.0 fL   MCH 30.2 26.0 - 34.0 pg   MCHC 33.2 30.0 - 36.0 g/dL   RDW 12.9 11.5 - 15.5 %   Platelets 169 150 - 400 K/uL   nRBC 0.0 0.0 - 0.2 %   Neutrophils Relative % 58 %   Neutro Abs 2.6 1.7 - 7.7 K/uL   Lymphocytes Relative 28 %   Lymphs Abs 1.3 0.7 - 4.0 K/uL   Monocytes Relative 11 %   Monocytes Absolute 0.5 0.1 - 1.0 K/uL   Eosinophils Relative 2 %   Eosinophils Absolute 0.1 0.0 - 0.5 K/uL   Basophils Relative 1 %   Basophils Absolute 0.0 0.0 - 0.1 K/uL   Immature Granulocytes 0 %   Abs Immature Granulocytes 0.02 0.00 - 0.07 K/uL    Comment: Performed at [facility], [address], [place], [place] [address]  Comprehensive metabolic panel     Status: Abnormal   Collection Time: 05/04/20 11:10 PM  Result Value Ref Range   Sodium 139 135 - 145 mmol/L   Potassium 3.6 3.5 -  5.1 mmol/L   Chloride 107 98 - 111 mmol/L   CO2 23 22 - 32 mmol/L   Glucose, Bld 115 (H) 70 - 99 mg/dL    Comment: Glucose reference range applies only to samples taken after fasting for at least 8 hours.   BUN <5 (L) 8 - 23 mg/dL   Creatinine, Ser 6.23 0.44 - 1.00 mg/dL   Calcium 8.7 (L) 8.9 - 10.3 mg/dL   Total Protein 6.1 (L) 6.5 - 8.1 g/dL   Albumin 3.3 (L) 3.5 - 5.0 g/dL   AST 20 15 - 41 U/L   ALT 14 0 - 44 U/L   Alkaline Phosphatase 44 38 - 126 U/L   Total Bilirubin 0.4 0.3 - 1.2 mg/dL   GFR calc non Af Amer >60 >60 mL/min   GFR calc Af Amer >60 >60 mL/min   Anion gap 9 5 - 15    Comment: Performed at [facility], [address], [place], [place] [address]  SARS Coronavirus 2 by RT PCR (hospital order, performed in [facility] hospital lab) Nasopharyngeal Nasopharyngeal Swab     Status: None   Collection Time: 05/04/20 11:21 PM   Specimen:  Nasopharyngeal Swab  Result Value Ref Range   SARS Coronavirus 2 NEGATIVE NEGATIVE    Comment: (NOTE) SARS-CoV-2 target nucleic acids are NOT DETECTED. The SARS-CoV-2 RNA is generally detectable in upper and lower respiratory specimens during the acute phase of infection. The lowest concentration of SARS-CoV-2 viral copies this assay can detect is 250 copies / mL. A negative result does not preclude SARS-CoV-2 infection and should not be used as the sole basis for treatment or other patient management decisions.  A negative result may occur with improper specimen collection / handling, submission of specimen other than nasopharyngeal swab, presence of viral mutation(s) within the areas targeted by this assay, and inadequate number of viral copies (<250 copies / mL). A negative result must be combined with clinical observations, patient history, and epidemiological information. Fact Sheet for Patients:   BoilerBrush.com.cy Fact Sheet for Healthcare Providers: https://pope.com/ This test is not yet approved or cleared  by the [place] FDA and has been authorized for detection and/or diagnosis of SARS-CoV-2 by FDA under an Emergency Use Authorization (EUA).  This EUA will remain in effect (meaning this test can be used) for the duration of the COVID-19 declaration under Section 564(b)(1) of the Act, 21 U.S.C. section 360bbb-3(b)(1), unless the authorization is terminated or revoked sooner. Performed at [facility], [address] [place], [place] [address]   CBC     Status: Abnormal   Collection Time: 05/05/20  6:51 AM  Result Value Ref Range   WBC 3.8 (L) 4.0 - 10.5 K/uL   RBC 4.13 3.87 - 5.11 MIL/uL   Hemoglobin 12.2 12.0 - 15.0 g/dL   HCT 16.0 73.7 - 10.6 %   MCV 92.3 80.0 - 100.0 fL   MCH 29.5 26.0 - 34.0 pg   MCHC 32.0 30.0 - 36.0 g/dL   RDW 26.9 48.5 - 46.2 %   Platelets 180 150 - 400 K/uL   nRBC 0.0 0.0 - 0.2 %     Comment: Performed at [facility], [address], [place], [place] [address]   Portable chest x-ray 1 view  Result Date: 05/05/2020 CLINICAL DATA:  Shortness of breath. Palpitations. Atrial fibrillation with RVR. EXAM: PORTABLE CHEST 1 VIEW COMPARISON:  09/11/2017 FINDINGS: Low lung volumes. Normal heart size. Aortic tortuosity  and atherosclerosis. No pulmonary edema. No focal airspace disease. No pleural fluid. No pneumothorax. Bones diffusely under mineralized. IMPRESSION: 1. Low lung volumes without acute abnormality. 2. Aortic tortuosity and atherosclerosis. Aortic Atherosclerosis (ICD10-I70.0). Electronically Signed   By: Narda Rutherford M.D.   On: 05/05/2020 03:03    Pending Labs Unresulted Labs (From admission, onward)    Start     Ordered   05/05/20 0500  Basic metabolic panel  Tomorrow morning,   R     05/05/20 0243   05/05/20 0500  Protime-INR  Tomorrow morning,   R     05/05/20 0243   05/05/20 0241  Magnesium  Once,   STAT     05/05/20 0243   05/05/20 0241  TSH  Once,   STAT     05/05/20 0243   05/05/20 0241  Hepatic function panel  Once,   STAT     05/05/20 0243          Vitals/Pain Today's Vitals   05/05/20 0500 05/05/20 0530 05/05/20 0600 05/05/20 0630  BP: (!) 81/63 98/62 (!) 100/53 (!) 94/58  Pulse: (!) 41 (!) 58 69 (!) 38  Resp: 16 14 15 14   Temp:      TempSrc:      SpO2: 94% 95% 97% 98%  Weight:      Height:      PainSc:        Isolation Precautions No active isolations  Medications Medications  diltiazem (CARDIZEM) 1 mg/mL load via infusion 10 mg (10 mg Intravenous Bolus from Bag 05/04/20 2029)    And  diltiazem (CARDIZEM) 125 mg in dextrose 5% 125 mL (1 mg/mL) infusion (15 mg/hr Intravenous Rate/Dose Change 05/05/20 0232)  acetaminophen (TYLENOL) tablet 650 mg (has no administration in time range)  ondansetron (ZOFRAN) injection 4 mg (has no administration in time range)  dofetilide (TIKOSYN) capsule 250 mcg (250 mcg Oral Given 05/05/20  0538)  vitamin B-12 (CYANOCOBALAMIN) tablet 100 mcg (has no administration in time range)  cholecalciferol (VITAMIN D3) tablet 1,000 Units (has no administration in time range)  potassium chloride SA (KLOR-CON) CR tablet 20 mEq (has no administration in time range)    Mobility walks Moderate fall risk   Focused Assessments Cardiac Assessment Handoff:    Lab Results  Component Value Date   CKTOTAL 97 04/30/2009   CKMB 2.0 04/30/2009   TROPONINI <0.03 09/11/2017   Lab Results  Component Value Date   DDIMER 1.00 (H) 09/14/2012   Does the Patient currently have chest pain? No     R Recommendations: See Admitting Provider Note  Report given to:   Additional Notes:

## 2020-05-05 NOTE — Discharge Summary (Signed)
Discharge Summary    Patient ID: Amanda Olsen MRN: [id number]; DOB: 1934/11/16  Admit date: 05/04/2020 Discharge date: 05/05/2020  Primary Care Provider: [name], DO  Primary Cardiologist: [name], MD  Primary Electrophysiologist:  [name], MD   Discharge Diagnoses    Active Problems:   Atrial fibrillation with rapid ventricular response [facility])  Diagnostic Studies/Procedures    Echocardiogram 05/05/20:  Echo performed with no formal read. Dr. Harrell Gave personally read which she reports low normal LVEF and no other abnormalities. No medication changes needed.   History of Present Illness     Amanda Olsen is a 86 y.o. female with hx of Afib on Tikosyn (infrequent breakthroughs), off Eliquis due to Epistaxis, HTNwho presented with atrial fibrillation with rapid ventricular response  She has known PAF and most recently presented 03/2020 with paroxysmal AF that resolved with diltiazem in the ED and did not require admission.  She reported that she was doing well until one day prior to admission when she was doing light kalisthenics (she is fairly active) and had felt herself go into AF. She seemed quite aware of her AF and reported symptoms of fatigue, weakness, and awareness of an elevated heart rate that she can "hear in her ear."  She had been adherent to all of her dofetilide and takes diltiazem PRN which did not convert her this time.  She was started on diltiazem infusion in the ED after 10 mg loading bolus and has remained in AF w/ RVR.  She refuses anticoagulation after multiple episodes of epistaxis. She was then trialed on ASA 325 mg with significant epistaxis and does not want anticoagulation going forward.  Hospital Course     She spontaneously converted to sinus rhythm. Feels well and wants to go home. Discussed risk of stroke without anticoagulation, she declines and will discuss further with Dr. Curt Bears on follow up.  Plan:  Atrial  fibrillation with RVR:  -Not on anticoagulation despite tikosyn. Dr. Harrell Gave dicussed with Dr. Curt Bears who reported that it was acceptable to continue Tikosyn at current dose and he will discuss anticoagulation with her further at follow up -Mg 1.9, K 3.4. Attempting to supplement K, she initially declined -Continue PTA PRN diltiazem for elevated heart rate -Blood pressure has been low to low normal therefore will hold lisinopril for now. Educated on monitoring BP at home, call if rises off of lisinopril -Continue home potassium supplementation. -Echo to be performed prior to discharge.   Consultants: None   The patient was seen and examined by Dr. Harrell Gave who feels that she is stable and ready for discharge after echocardiogram with follow-up.  Did the patient have an acute coronary syndrome (MI, NSTEMI, STEMI, etc) this admission?:  No                               Did the patient have a percutaneous coronary intervention (stent / angioplasty)?:  No.   _____________  Discharge Vitals Blood pressure 116/69, pulse 69, temperature 98.5 F (36.9 C), temperature source Oral, resp. rate 20, height 5\' 6"  (1.676 m), weight 53.1 kg, SpO2 97 %.  Filed Weights   05/04/20 1907 05/05/20 0801  Weight: 59 kg 53.1 kg    Labs & Radiologic Studies    CBC Recent Labs    05/04/20 2310 05/05/20 0651  WBC 4.5 3.8*  NEUTROABS 2.6  --   HGB 11.7* 12.2  HCT 35.2* 38.1  MCV  90.7 92.3  PLT 169 180   Basic Metabolic Panel Recent Labs    29/47/65 2310 05/05/20 0651 05/05/20 0652  NA 139 139  --   K 3.6 3.4*  --   CL 107 105  --   CO2 23 24  --   GLUCOSE 115* 96  --   BUN <5* <5*  --   CREATININE 0.60 0.66  --   CALCIUM 8.7* 8.9  --   MG  --   --  1.9   Liver Function Tests Recent Labs    05/04/20 2310 05/05/20 0652  AST 20 21  ALT 14 13  ALKPHOS 44 46  BILITOT 0.4 0.6  PROT 6.1* 6.4*  ALBUMIN 3.3* 3.3*   No results for input(s): LIPASE, AMYLASE in the last 72  hours. High Sensitivity Troponin:   No results for input(s): TROPONINIHS in the last 720 hours.  BNP Invalid input(s): POCBNP D-Dimer No results for input(s): DDIMER in the last 72 hours. Hemoglobin A1C No results for input(s): HGBA1C in the last 72 hours. Fasting Lipid Panel No results for input(s): CHOL, HDL, LDLCALC, TRIG, CHOLHDL, LDLDIRECT in the last 72 hours. Thyroid Function Tests Recent Labs    05/05/20 0650  TSH 4.065   _____________  Portable chest x-ray 1 view  Result Date: 05/05/2020 CLINICAL DATA:  Shortness of breath. Palpitations. Atrial fibrillation with RVR. EXAM: PORTABLE CHEST 1 VIEW COMPARISON:  09/11/2017 FINDINGS: Low lung volumes. Normal heart size. Aortic tortuosity and atherosclerosis. No pulmonary edema. No focal airspace disease. No pleural fluid. No pneumothorax. Bones diffusely under mineralized. IMPRESSION: 1. Low lung volumes without acute abnormality. 2. Aortic tortuosity and atherosclerosis. Aortic Atherosclerosis (ICD10-I70.0). Electronically Signed   By: Narda Rutherford M.D.   On: 05/05/2020 03:03   Disposition   Pt is being discharged home today in good condition.  Follow-up Plans & Appointments   Follow-up Information    Sheilah Pigeon, PA-C Follow up on 05/19/2020.   Specialty: Cardiology Why: followup appointment 05/19/20 at 11am Contact information: [address] [place] [address] [phone]          Discharge Instructions    Amb referral to AFIB Clinic   Complete by: As directed    Call MD for:  difficulty breathing, headache or visual disturbances   Complete by: As directed    Call MD for:  extreme fatigue   Complete by: As directed    Call MD for:  hives   Complete by: As directed    Call MD for:  persistant dizziness or light-headedness   Complete by: As directed    Call MD for:  persistant nausea and vomiting   Complete by: As directed    Call MD for:  redness, tenderness, or signs of infection (pain,  swelling, redness, odor or green/yellow discharge around incision site)   Complete by: As directed    Call MD for:  severe uncontrolled pain   Complete by: As directed    Call MD for:  temperature >100.4   Complete by: As directed    Diet - low sodium heart healthy   Complete by: As directed    Increase activity slowly   Complete by: As directed      Discharge Medications   Allergies as of 05/05/2020   No Known Allergies     Medication List    STOP taking these medications   lisinopril 20 MG tablet Commonly known as: ZESTRIL     TAKE these  medications   diltiazem 30 MG tablet Commonly known as: Cardizem Take 1 tablet every 4 hours AS NEEDED for AFIB heart rate >100 as long as blood pressure >100.   dofetilide 250 MCG capsule Commonly known as: TIKOSYN Take 1 capsule (250 mcg total) by mouth 2 (two) times daily.   Klor-Con M20 20 MEQ tablet Generic drug: potassium chloride SA TAKE ONE TABLET BY MOUTH DAILY What changed:   how much to take  when to take this   MAGNESIUM PO Take 1 tablet by mouth daily as needed ("for low magnesium").   vitamin B-12 100 MCG tablet Commonly known as: CYANOCOBALAMIN Take 100 mcg by mouth daily.   Vitamin D-3 25 MCG (1000 UT) Caps Take 1,000 Units by mouth daily.       Outstanding Labs/Studies   None   Duration of Discharge Encounter   Greater than 30 minutes including physician time.  Signed, Georgie Chard, NP 05/05/2020, 2:25 PM

## 2020-05-05 NOTE — ED Provider Notes (Signed)
Care assumed from Dr. Dalene Seltzer, patient with paroxysmal atrial fibrillation and rapid ventricular response controlled on diltiazem drip.  Patient is not anticoagulated.  Plan was to admit to cardiology once labs were back.  Labs are unremarkable.  Case is discussed with Dr. Julianne Handler of cardiology service who agrees to admit the patient.  Results for orders placed or performed during the hospital encounter of 05/04/20  CBC with Differential  Result Value Ref Range   WBC 4.5 4.0 - 10.5 K/uL   RBC 3.88 3.87 - 5.11 MIL/uL   Hemoglobin 11.7 (L) 12.0 - 15.0 g/dL   HCT 63.0 (L) 16.0 - 10.9 %   MCV 90.7 80.0 - 100.0 fL   MCH 30.2 26.0 - 34.0 pg   MCHC 33.2 30.0 - 36.0 g/dL   RDW 32.3 55.7 - 32.2 %   Platelets 169 150 - 400 K/uL   nRBC 0.0 0.0 - 0.2 %   Neutrophils Relative % 58 %   Neutro Abs 2.6 1.7 - 7.7 K/uL   Lymphocytes Relative 28 %   Lymphs Abs 1.3 0.7 - 4.0 K/uL   Monocytes Relative 11 %   Monocytes Absolute 0.5 0.1 - 1.0 K/uL   Eosinophils Relative 2 %   Eosinophils Absolute 0.1 0.0 - 0.5 K/uL   Basophils Relative 1 %   Basophils Absolute 0.0 0.0 - 0.1 K/uL   Immature Granulocytes 0 %   Abs Immature Granulocytes 0.02 0.00 - 0.07 K/uL  Comprehensive metabolic panel  Result Value Ref Range   Sodium 139 135 - 145 mmol/L   Potassium 3.6 3.5 - 5.1 mmol/L   Chloride 107 98 - 111 mmol/L   CO2 23 22 - 32 mmol/L   Glucose, Bld 115 (H) 70 - 99 mg/dL   BUN <5 (L) 8 - 23 mg/dL   Creatinine, Ser 0.25 0.44 - 1.00 mg/dL   Calcium 8.7 (L) 8.9 - 10.3 mg/dL   Total Protein 6.1 (L) 6.5 - 8.1 g/dL   Albumin 3.3 (L) 3.5 - 5.0 g/dL   AST 20 15 - 41 U/L   ALT 14 0 - 44 U/L   Alkaline Phosphatase 44 38 - 126 U/L   Total Bilirubin 0.4 0.3 - 1.2 mg/dL   GFR calc non Af Amer >60 >60 mL/min   GFR calc Af Amer >60 >60 mL/min   Anion gap 9 5 - 15   No results found.   Dione Booze, MD 05/05/20 [phone]

## 2020-05-16 NOTE — Progress Notes (Signed)
Cardiology Office Note Date:  05/19/2020  Patient ID:  Amanda, Olsen 1934-01-23, MRN [id number] PCP:  [name], DO  Cardiologist:  Dr. Eden Emms EP: Dr. Elberta Fortis    Chief Complaint: post hospital  History of Present Illness: Amanda Olsen is a 86 y.o. female with history of HTN, LBBB, Afib.    She comes in today to be seen for Dr. Ella Bodo, last seen by him march 2019, at that time she reported a single approx 5hr episode of Afib since being in [place].  No changes were made.  Subsequentloy her Eliquis was stopped 2/2 epistaxis.  She had an ER visit 03/24/2020 with Afib episode that persisted.  She had spontaneous conversion in the ER.  Dr. Elberta Fortis saw her, she was compliant with her Tikosyn, though for some reason not her K+. "She presented in atrial fibrillation but converted on her own with postconversion pauses.  She also had wide-complex tachycardias which were irregular which were possibly atrial fibrillation with aberrancy.  Her potassium was low at 3.3 as well as magnesium of 1.9.  She would prefer to continue on her current dose of dofetilide.  Would replete potassium.  Would give 40 mEq more.  Planning to give 1 g of magnesium as well.  She already has follow-up in atrial fibrillation "  At her f/u in the AF clinic, looks like a/c was revisited, the pt was clear in her decision not to resume aware of her stroke risk.  She was hospitalized 5/25-26/2021 2/2 Afib w/RVR, started on dilt gtt in the ER and had spontaneous conversion to SR.  Her K+ again low at 3.4, the pt initially decline supplementation.  Her BP slightly low and her ACE stopped. Case was discussed with Dr. Elberta Fortis, planned to continue her Tikosyn w/o a/c to be revisited outpt.   The patient's primary language is [place], she moved her 2010, and declines translation.  She is asked if there is anything she is not fully understanding to let me know.  She is quite concerned about her BP today.  She reports  checking her BP at home, and 130 would be the higher end of her SBP at home. She is concerned about having had 2 AFib episodes so close together, that her AFib has been well controlled until of late.  She mentions she the last AFib episode occurred after her walk. She walks daily and this is the 1st time her AFib happened after exercise  She reports taking her medicines avery day as prescribed including her potassium. She walks briskly every day, at least an hour, sometimes 2 hours.  No CP, no SOB or unusual DOE She has good exertional capacity. No dizzy spells, near syncope or syncope.  She notes that when in AFib she is very aware of her heart beat in her abdomen and this also worries her.    AAD hx 2009, Flecainide >> LBBB >>stopped July 2018 July 2018, Tikosyn >> current  Hx of Eliquis stopped 2/2 epistaxis, notes report ENT unable to find source. Pt has declined a/c going forward  Past Medical History:  Diagnosis Date  . Atrial fibrillation (HCC)    mostly in SR on Tikosyn  . Cataract   . HTN (hypertension)   . Rosacea     Past Surgical History:  Procedure Laterality Date  . VARICOSE VEIN SURGERY      Current Outpatient Medications  Medication Sig Dispense Refill  . Cholecalciferol (VITAMIN D-3) 25 MCG (1000 UT) CAPS Take  1,000 Units by mouth daily.     Marland Kitchen diltiazem (CARDIZEM) 30 MG tablet Take 1 tablet every 4 hours AS NEEDED for AFIB heart rate >100 as long as blood pressure >100. 45 tablet 1  . dofetilide (TIKOSYN) 250 MCG capsule Take 1 capsule (250 mcg total) by mouth 2 (two) times daily. 60 capsule 6  . KLOR-CON M20 20 MEQ tablet TAKE ONE TABLET BY MOUTH DAILY (Patient taking differently: Take 10 mEq by mouth 2 (two) times daily. ) 90 tablet 3  . MAGNESIUM PO Take 1 tablet by mouth daily as needed ("for low magnesium").    . vitamin B-12 (CYANOCOBALAMIN) 100 MCG tablet Take 100 mcg by mouth daily.     No current facility-administered medications for this visit.     Allergies:   Patient has no known allergies.   Social History:  The patient  reports that she has never smoked. She has never used smokeless tobacco. She reports that she does not drink alcohol or use drugs.   Family History:  The patient's family history includes Heart Problems in her mother.  ROS:  Please see the history of present illness.  All other systems are reviewed and otherwise negative.   PHYSICAL EXAM:  VS:  BP (!) 174/82   Pulse 67   Ht 5\' 6"  (1.676 m)   Wt 119 lb (54 kg)   BMI 19.21 kg/m  BMI: Body mass index is 19.21 kg/m. Well nourished, well developed though thin body habitus, in no acute distress  HEENT: normocephalic, atraumatic  Neck: no JVD, carotid bruits or masses Cardiac:  RRR; no significant murmurs, no rubs, or gallops Lungs:  CTA b/l, no wheezing, rhonchi or rales  Abd: soft, non tender.  Palpable abdominal Ao, I do not appreciate widening Ao on exam, no brui appreciated MS: no deformity, age appropriately atrophy Ext: no edema  Skin: warm and dry, no rash Neuro:  No gross deficits appreciated Psych: euthymic mood, full affect   EKG:  Done today and reviewed by myself SR 67bpm, V couplet (multifocal), manually measured Qt , Qtc   05/05/2020: TTE IMPRESSIONS  1. Left ventricular ejection fraction, by estimation, is 50 to 55%. The  left ventricle has low normal function. The left ventricle has no regional  wall motion abnormalities. Left ventricular diastolic parameters are  consistent with Grade I diastolic  dysfunction (impaired relaxation).  2. Right ventricular systolic function is normal. The right ventricular  size is normal. There is normal pulmonary artery systolic pressure. The  estimated right ventricular systolic pressure is 19.6 mmHg.  3. The mitral valve is grossly normal. No evidence of mitral valve  regurgitation. No evidence of mitral stenosis.  4. The aortic valve is tricuspid. Aortic valve regurgitation is  not  visualized. Mild aortic valve sclerosis is present, with no evidence of  aortic valve stenosis.  5. The inferior vena cava is normal in size with greater than 50%  respiratory variability, suggesting right atrial pressure of 3 mmHg.   Comparison(s): No significant change from prior study.     Recent Labs: 05/05/2020: ALT 13; BUN <5; Creatinine, Ser 0.66; Hemoglobin 12.2; Magnesium 1.9; Platelets 180; Potassium 3.4; Sodium 139; TSH 4.065  No results found for requested labs within last 8760 hours.   Estimated Creatinine Clearance: 45.4 mL/min (by C-G formula based on SCr of 0.66 mg/dL).   Wt Readings from Last 3 Encounters:  05/19/20 119 lb (54 kg)  05/05/20 117 lb 1.6 oz (53.1 kg)  04/01/20  127 lb 9.6 oz (57.9 kg)     Other studies reviewed: Additional studies/records reviewed today include: summarized above  ASSESSMENT AND PLAN:  1. Paroxysmal Afib     CHA2DS2Vasc is 4, not on a/c as discussed above     On Tikosyn, QT looks OK      ? Increasing burden       recurrent hypokalemia  BMET and mag today Discussed with her if he AFib buren continue sto increase will need to consider alternative AAD.   Might consider amio Some concerns about her recurrent hypokalemia, she reported compliance with her supplementation   I revisited stroke risk with AFib and role of anticoagulation medicine.  She states understanding but continues to decline anticoagulation    2. HTN     Off her ACE at the last hospital stay with lower readings     recheck is 160/80     BP here is unusually high for her     She is quite worried     Will have her keep daily BP Log and I will see her back I a few weeks   3. Feels her heart beat in her abdomen     No clear exam findings of AAA     Will get [place]    Disposition: F/u as above    Current medicines are reviewed at length with the patient today.  The patient did not have any concerns regarding medicines.  Venetia Night,  PA-C 05/19/2020 11:34 AM     [address] [place] [address] [phone] (office)  [phone] (fax)

## 2020-05-19 ENCOUNTER — Ambulatory Visit (INDEPENDENT_AMBULATORY_CARE_PROVIDER_SITE_OTHER): Payer: Medicare HMO | Admitting: Physician Assistant

## 2020-05-19 ENCOUNTER — Other Ambulatory Visit: Payer: Self-pay

## 2020-05-19 VITALS — BP 174/82 | HR 67 | Ht 66.0 in | Wt 119.0 lb

## 2020-05-19 DIAGNOSIS — I10 Essential (primary) hypertension: Secondary | ICD-10-CM | POA: Diagnosis not present

## 2020-05-19 DIAGNOSIS — R0989 Other specified symptoms and signs involving the circulatory and respiratory systems: Secondary | ICD-10-CM | POA: Diagnosis not present

## 2020-05-19 DIAGNOSIS — Z5181 Encounter for therapeutic drug level monitoring: Secondary | ICD-10-CM | POA: Diagnosis not present

## 2020-05-19 DIAGNOSIS — I480 Paroxysmal atrial fibrillation: Secondary | ICD-10-CM

## 2020-05-19 DIAGNOSIS — Z79899 Other long term (current) drug therapy: Secondary | ICD-10-CM

## 2020-05-19 LAB — BASIC METABOLIC PANEL
BUN/Creatinine Ratio: 10 — ABNORMAL LOW (ref 12–28)
BUN: 7 mg/dL — ABNORMAL LOW (ref 8–27)
CO2: 23 mmol/L (ref 20–29)
Calcium: 9.4 mg/dL (ref 8.7–10.3)
Chloride: 101 mmol/L (ref 96–106)
Creatinine, Ser: 0.7 mg/dL (ref 0.57–1.00)
GFR calc Af Amer: 93 mL/min/{1.73_m2} (ref >59)
GFR calc non Af Amer: 80 mL/min/{1.73_m2} (ref >59)
Glucose: 94 mg/dL (ref 65–99)
Potassium: 4.6 mmol/L (ref 3.5–5.2)
Sodium: 136 mmol/L (ref 134–144)

## 2020-05-19 LAB — MAGNESIUM: Magnesium: 2 mg/dL (ref 1.6–2.3)

## 2020-05-19 NOTE — Patient Instructions (Signed)
Medication Instructions:   Your physician recommends that you continue on your current medications as directed. Please refer to the Current Medication list given to you today.  *If you need a refill on your cardiac medications before your next appointment, please call your pharmacy*   Lab Work:  BMET AND MAG TODAY   If you have labs (blood work) drawn today and your tests are completely normal, you will receive your results only by: Marland Kitchen MyChart Message (if you have MyChart) OR . A paper copy in the mail If you have any lab test that is abnormal or we need to change your treatment, we will call you to review the results.   Testing/Procedures: Your physician has requested that you have an abdominal aorta duplex. During this test, an ultrasound is used to evaluate the aorta. Allow 30 minutes for this exam. Do not eat after midnight the day before and avoid carbonated beverages  Follow-Up: At [facility], you and your health needs are our priority.  As part of our continuing mission to provide you with exceptional heart care, we have created designated Provider Care Teams.  These Care Teams include your primary Cardiologist (physician) and Advanced Practice Providers (APPs -  Physician Assistants and Nurse Practitioners) who all work together to provide you with the care you need, when you need it.  We recommend signing up for the patient portal called "MyChart".  Sign up information is provided on this After Visit Summary.  MyChart is used to connect with patients for Virtual Visits (Telemedicine).  Patients are able to view lab/test results, encounter notes, upcoming appointments, etc.  Non-urgent messages can be sent to your provider as well.   To learn more about what you can do with MyChart, go to [email or web address].    Your next appointment:   2 week(s)  The format for your next appointment:   In Person  Provider:    You may see    Francis Dowse, PA-C   Other  Instructions

## 2020-06-10 ENCOUNTER — Other Ambulatory Visit: Payer: Self-pay | Admitting: Physician Assistant

## 2020-06-10 ENCOUNTER — Other Ambulatory Visit: Payer: Self-pay

## 2020-06-10 ENCOUNTER — Ambulatory Visit (HOSPITAL_COMMUNITY)
Admission: RE | Admit: 2020-06-10 | Discharge: 2020-06-10 | Disposition: A | Discharge status: Home / Self Care | Payer: Medicare HMO | Source: Ambulatory Visit | Attending: Physician Assistant | Admitting: Physician Assistant

## 2020-06-10 DIAGNOSIS — R0989 Other specified symptoms and signs involving the circulatory and respiratory systems: Secondary | ICD-10-CM

## 2020-06-10 DIAGNOSIS — I7789 Other specified disorders of arteries and arterioles: Secondary | ICD-10-CM

## 2020-06-15 ENCOUNTER — Telehealth: Payer: Self-pay | Admitting: *Deleted

## 2020-06-15 NOTE — Telephone Encounter (Signed)
Lvm of results and clinic number if any questions  

## 2020-06-16 NOTE — Progress Notes (Signed)
Cardiology Office Note Date:  06/16/2020  Patient ID:  Jewels, Langone [birth year], MRN [id number] PCP:  [name], DO  Cardiologist:  Dr. Eden Emms EP: Dr. Elberta Fortis     Chief Complaint: planned f/u  History of Present Illness: JAMYA STARRY is a 86 y.o. female with history of HTN, LBBB, Afib.    She comes in today to be seen for Dr. Ella Bodo, last seen by him march 2019, at that time she reported a single approx 5hr episode of Afib since being in [place].  No changes were made.  Subsequentloy her Eliquis was stopped 2/2 epistaxis.  She had an ER visit 03/24/2020 with Afib episode that persisted.  She had spontaneous conversion in the ER.  Dr. Elberta Fortis saw her, she was compliant with her Tikosyn, though for some reason not her K+. "She presented in atrial fibrillation but converted on her own with postconversion pauses.  She also had wide-complex tachycardias which were irregular which were possibly atrial fibrillation with aberrancy.  Her potassium was low at 3.3 as well as magnesium of 1.9.  She would prefer to continue on her current dose of dofetilide.  Would replete potassium.  Would give 40 mEq more.  Planning to give 1 g of magnesium as well.  She already has follow-up in atrial fibrillation "  At her f/u in the AF clinic, looks like a/c was revisited, the pt was clear in her decision not to resume aware of her stroke risk.  She was hospitalized 5/25-26/2021 2/2 Afib w/RVR, started on dilt gtt in the ER and had spontaneous conversion to SR.  Her K+ again low at 3.4, the pt initially decline supplementation.  Her BP slightly low and her ACE stopped. Case was discussed with Dr. Elberta Fortis, planned to continue her Tikosyn w/o a/c to be revisited outpt.   I saw her 05/19/2020 The patient's primary language is [place], she moved her 2010, and declines translation.  She is asked if there is anything she is not fully understanding to let me know.  She is quite concerned about her BP  today.  She reports checking her BP at home, and 130 would be the higher end of her SBP at home. She is concerned about having had 2 AFib episodes so close together, that her AFib has been well controlled until of late.  She mentions she the last AFib episode occurred after her walk. She walks daily and this is the 1st time her AFib happened after exercise  She reports taking her medicines avery day as prescribed including her potassium. She walks briskly every day, at least an hour, sometimes 2 hours.  No CP, no SOB or unusual DOE She has good exertional capacity. No dizzy spells, near syncope or syncope.  She notes that when in AFib she is very aware of her heart beat in her abdomen and this also worries her.  We discussed indication for a/c and role of stroke reduction for her, she continued to decline a/c.  She was quite worried about her BP with some higher readings, noting off her ACE post discharge with some lower BP readings in the hospital. Planned to keep diary and send her for abd Ao [place].BMET done with concerns of her recurrent hypokalemia and Tikosyn  [place] was OK without evidence of aneurysm. Lytes were OK  TODAY She has self resumed her lisinopril 20mg  daily with high BP at home as high as 1260/90 pr so, sometimes associated with HR 80's that make her  feel very worried and tired.  She has had improved BP back on her lisinopril 130-140/80's and typically her HR 58-60's. No dizzy spells, near syncope or syncope, no CP or palpitations.  She very much worries about her BP and would like to be back on her lisinopril formally will need a new Rx.    AAD hx 2009, Flecainide >> LBBB >>stopped July 2018 July 2018, Tikosyn >> current  Hx of Eliquis stopped 2/2 epistaxis, notes report ENT unable to find source. Pt has declined a/c going forward  Past Medical History:  Diagnosis Date  . Atrial fibrillation (HCC)    mostly in SR on Tikosyn  . Cataract   . HTN (hypertension)   .  Rosacea     Past Surgical History:  Procedure Laterality Date  . VARICOSE VEIN SURGERY      Current Outpatient Medications  Medication Sig Dispense Refill  . Cholecalciferol (VITAMIN D-3) 25 MCG (1000 UT) CAPS Take 1,000 Units by mouth daily.     Marland Kitchen diltiazem (CARDIZEM) 30 MG tablet Take 1 tablet every 4 hours AS NEEDED for AFIB heart rate >100 as long as blood pressure >100. 45 tablet 1  . dofetilide (TIKOSYN) 250 MCG capsule Take 1 capsule (250 mcg total) by mouth 2 (two) times daily. 60 capsule 6  . KLOR-CON M20 20 MEQ tablet TAKE ONE TABLET BY MOUTH DAILY (Patient taking differently: Take 10 mEq by mouth 2 (two) times daily. ) 90 tablet 3  . MAGNESIUM PO Take 1 tablet by mouth daily as needed ("for low magnesium").    . vitamin B-12 (CYANOCOBALAMIN) 100 MCG tablet Take 100 mcg by mouth daily.     No current facility-administered medications for this visit.    Allergies:   Patient has no known allergies.   Social History:  The patient  reports that she has never smoked. She has never used smokeless tobacco. She reports that she does not drink alcohol and does not use drugs.   Family History:  The patient's family history includes Heart Problems in her mother.  ROS:  Please see the history of present illness.  All other systems are reviewed and otherwise negative.   PHYSICAL EXAM:  VS:  There were no vitals taken for this visit. BMI: There is no height or weight on file to calculate BMI. Well nourished, well developed though thin body habitus, in no acute distress  HEENT: normocephalic, atraumatic  Neck: no JVD, carotid bruits or masses Cardiac:  RRR; no significant murmurs, no rubs, or gallops Lungs:  CTA b/l, no wheezing, rhonchi or rales  Abd: soft, non tender.   MS: no deformity, age appropriately atrophy Ext: no edema  Skin: warm and dry, no rash Neuro:  No gross deficits appreciated Psych: euthymic mood, full affect   EKG:  Done today and reviewed by myself SR  66bpm, QTc stable 6/92021: SR 67bpm, V couplet (multifocal), manually measured Qt , Qtc   06/10/2020: Abd aorta US Summary:  Abdominal Aorta: No evidence of an abdominal aortic aneurysm was  visualized. The largest aortic measurement is 2.5 cm. The bilateral common  iliac arteries are ectatic, measuring 1.6 cm on the right and 1.7 cm on  the left.  Stenosis:  Mild aorta-iliac atherosclerosis, without focal stenosis.   IVC/Iliac: There is no evidence of thrombus involving the IVC.    05/05/2020: TTE IMPRESSIONS  1. Left ventricular ejection fraction, by estimation, is 50 to 55%. The  left ventricle has low normal function. The  left ventricle has no regional  wall motion abnormalities. Left ventricular diastolic parameters are  consistent with Grade I diastolic  dysfunction (impaired relaxation).  2. Right ventricular systolic function is normal. The right ventricular  size is normal. There is normal pulmonary artery systolic pressure. The  estimated right ventricular systolic pressure is 19.6 mmHg.  3. The mitral valve is grossly normal. No evidence of mitral valve  regurgitation. No evidence of mitral stenosis.  4. The aortic valve is tricuspid. Aortic valve regurgitation is not  visualized. Mild aortic valve sclerosis is present, with no evidence of  aortic valve stenosis.  5. The inferior vena cava is normal in size with greater than 50%  respiratory variability, suggesting right atrial pressure of 3 mmHg.   Comparison(s): No significant change from prior study.     Recent Labs: 05/05/2020: ALT 13; Hemoglobin 12.2; Platelets 180; TSH 4.065 05/19/2020: BUN 7; Creatinine, Ser 0.70; Magnesium 2.0; Potassium 4.6; Sodium 136  No results found for requested labs within last 8760 hours.   CrCl cannot be calculated (Patient's most recent lab result is older than the maximum 21 days allowed.).   Wt Readings from Last 3 Encounters:  05/19/20 119 lb (54 kg)  05/05/20  117 lb 1.6 oz (53.1 kg)  04/01/20 127 lb 9.6 oz (57.9 kg)     Other studies reviewed: Additional studies/records reviewed today include: summarized above  ASSESSMENT AND PLAN:  1. Paroxysmal Afib     CHA2DS2Vasc is 4, not on a/c as discussed above     On Tikosyn, QT looks OK      ? Increasing burden       recurrent hypokalemia  No symptoms of AFib  If burden goes up we may need to consider alternative AAD.   Might consider amio Lytes were OK at last check, she reports compliance with her supplementation has annual labs scheduled for next week via her PMD   I as well as other providers have discussed stroke risk with AFib and role of anticoagulation medicine.  She has stated understanding and has declined anticoagulation    2. HTN     Resume her lisinopril 20mg  daily     She reports only once since she started taking it again that her SBP was 100      Disposition: she has AFib clinic appt in Oct she will keep this.  She has labs next week by her PMD and an office visit in 2 weeks with the PMD.    Current medicines are reviewed at length with the patient today.  The patient did not have any concerns regarding medicines.  Nov, PA-C 06/16/2020 5:53 AM     [facility] [address] [place] [phone] (office)  [phone] (fax)

## 2020-06-17 ENCOUNTER — Other Ambulatory Visit: Payer: Self-pay

## 2020-06-17 ENCOUNTER — Ambulatory Visit (INDEPENDENT_AMBULATORY_CARE_PROVIDER_SITE_OTHER): Payer: Medicare HMO | Admitting: Physician Assistant

## 2020-06-17 VITALS — BP 118/86 | HR 66 | Ht 66.0 in | Wt 113.0 lb

## 2020-06-17 DIAGNOSIS — I480 Paroxysmal atrial fibrillation: Secondary | ICD-10-CM

## 2020-06-17 DIAGNOSIS — Z79899 Other long term (current) drug therapy: Secondary | ICD-10-CM

## 2020-06-17 DIAGNOSIS — Z5181 Encounter for therapeutic drug level monitoring: Secondary | ICD-10-CM

## 2020-06-17 DIAGNOSIS — I10 Essential (primary) hypertension: Secondary | ICD-10-CM

## 2020-06-17 MED ORDER — LISINOPRIL 20 MG PO TABS
20.0000 mg | ORAL_TABLET | Freq: Every day | ORAL | 3 refills | Status: DC
Start: 2020-06-17 — End: 2020-11-11

## 2020-06-17 NOTE — Patient Instructions (Signed)
Medication Instructions:   .START TAKING LISINOPRIL 20 MG ONCE A DAY   Your physician recommends that you continue on your current medications as directed. Please refer to the Current Medication list given to you today.  *If you need a refill on your cardiac medications before your next appointment, please call your pharmacy*   Lab Work: NONE ORDERED  TODAY   If you have labs (blood work) drawn today and your tests are completely normal, you will receive your results only by: Marland Kitchen MyChart Message (if you have MyChart) OR . A paper copy in the mail If you have any lab test that is abnormal or we need to change your treatment, we will call you to review the results.   Testing/Procedures: NONE ORDERED  TODAY     Follow-Up: At [facility], you and your health needs are our priority.  As part of our continuing mission to provide you with exceptional heart care, we have created designated Provider Care Teams.  These Care Teams include your primary Cardiologist (physician) and Advanced Practice Providers (APPs -  Physician Assistants and Nurse Practitioners) who all work together to provide you with the care you need, when you need it.  We recommend signing up for the patient portal called "MyChart".  Sign up information is provided on this After Visit Summary.  MyChart is used to connect with patients for Virtual Visits (Telemedicine).  Patients are able to view lab/test results, encounter notes, upcoming appointments, etc.  Non-urgent messages can be sent to your provider as well.   To learn more about what you can do with MyChart, go to [email or web address].    Your next appointment:  AS SCHEDULED   Other Instructions  KEEP BLOOD PRESSURE LOG

## 2020-06-18 NOTE — Addendum Note (Signed)
Addended by: Oleta Mouse on: 06/18/2020 12:36 PM   Modules accepted: Orders

## 2020-06-23 DIAGNOSIS — E559 Vitamin D deficiency, unspecified: Secondary | ICD-10-CM | POA: Diagnosis not present

## 2020-06-23 DIAGNOSIS — M810 Age-related osteoporosis without current pathological fracture: Secondary | ICD-10-CM | POA: Diagnosis not present

## 2020-06-23 DIAGNOSIS — K219 Gastro-esophageal reflux disease without esophagitis: Secondary | ICD-10-CM | POA: Diagnosis not present

## 2020-06-30 DIAGNOSIS — E559 Vitamin D deficiency, unspecified: Secondary | ICD-10-CM | POA: Diagnosis not present

## 2020-06-30 DIAGNOSIS — R946 Abnormal results of thyroid function studies: Secondary | ICD-10-CM | POA: Diagnosis not present

## 2020-06-30 DIAGNOSIS — Z0000 Encounter for general adult medical examination without abnormal findings: Secondary | ICD-10-CM | POA: Diagnosis not present

## 2020-06-30 DIAGNOSIS — M810 Age-related osteoporosis without current pathological fracture: Secondary | ICD-10-CM | POA: Diagnosis not present

## 2020-06-30 DIAGNOSIS — R7301 Impaired fasting glucose: Secondary | ICD-10-CM | POA: Diagnosis not present

## 2020-06-30 DIAGNOSIS — I480 Paroxysmal atrial fibrillation: Secondary | ICD-10-CM | POA: Diagnosis not present

## 2020-06-30 DIAGNOSIS — I10 Essential (primary) hypertension: Secondary | ICD-10-CM | POA: Diagnosis not present

## 2020-07-08 ENCOUNTER — Other Ambulatory Visit (HOSPITAL_COMMUNITY): Payer: Self-pay | Admitting: *Deleted

## 2020-07-08 MED ORDER — DOFETILIDE 250 MCG PO CAPS: 250.0000 ug | ORAL_CAPSULE | Freq: Two times a day (BID) | ORAL | 6 refills | Status: DC

## 2020-09-21 ENCOUNTER — Other Ambulatory Visit (HOSPITAL_COMMUNITY): Payer: Self-pay | Admitting: Nurse Practitioner

## 2020-09-30 ENCOUNTER — Other Ambulatory Visit: Payer: Self-pay

## 2020-09-30 ENCOUNTER — Encounter (HOSPITAL_COMMUNITY): Payer: Self-pay | Admitting: Nurse Practitioner

## 2020-09-30 ENCOUNTER — Ambulatory Visit (HOSPITAL_COMMUNITY)
Admission: RE | Admit: 2020-09-30 | Discharge: 2020-09-30 | Disposition: A | Discharge status: Home / Self Care | Payer: Medicare HMO | Source: Ambulatory Visit | Attending: Nurse Practitioner | Admitting: Nurse Practitioner

## 2020-09-30 VITALS — BP 168/84 | HR 65 | Ht 66.0 in | Wt 119.6 lb

## 2020-09-30 DIAGNOSIS — I480 Paroxysmal atrial fibrillation: Secondary | ICD-10-CM | POA: Diagnosis not present

## 2020-09-30 DIAGNOSIS — D6869 Other thrombophilia: Secondary | ICD-10-CM

## 2020-09-30 DIAGNOSIS — I519 Heart disease, unspecified: Secondary | ICD-10-CM | POA: Insufficient documentation

## 2020-09-30 DIAGNOSIS — Z8249 Family history of ischemic heart disease and other diseases of the circulatory system: Secondary | ICD-10-CM | POA: Diagnosis not present

## 2020-09-30 LAB — BASIC METABOLIC PANEL
Anion gap: 9 (ref 5–15)
BUN: 11 mg/dL (ref 8–23)
CO2: 23 mmol/L (ref 22–32)
Calcium: 9 mg/dL (ref 8.9–10.3)
Chloride: 104 mmol/L (ref 98–111)
Creatinine, Ser: 0.7 mg/dL (ref 0.44–1.00)
GFR, Estimated: >60 mL/min (ref >60)
Glucose, Bld: 136 mg/dL — ABNORMAL HIGH (ref 70–99)
Potassium: 3.9 mmol/L (ref 3.5–5.1)
Sodium: 136 mmol/L (ref 135–145)

## 2020-09-30 LAB — MAGNESIUM: Magnesium: 2.1 mg/dL (ref 1.7–2.4)

## 2020-09-30 NOTE — Progress Notes (Signed)
Primary Care Physician: [name], DO Referring Physician: [facility] ER f/u Cardiologist: Dr. Eden Emms EP: Dr. [name] is a 86 y.o. female with a h/o paroxysmal afib in the afib clinic for f/u  tikosyn use. Reports no afib. She feels well. She is not on anticoagulation per her choice as she had repeat nose bleeds despite having cautery x 2. She was told the risks of stroke being off anticoagulation and she was clear she did not want to be on blood thinners any longer.  F/u afib clinic, 4/22 for an ER visit last week for afib with RVR. She spontaneously converted with a cardizem drip. Her K+ was low at 3.3, pt had stopped taking and was placed back on K +. She remains off anticoagulation 2/2 issues with nosebleeds a while back and clear in her decision not to take anticoagulation. She is in SR today.   F/u in afib clinic, 09/30/20. Pt reports that she had an episode of afib in May and IV diltiazem was given to which pt converted quickly. She was d/c home. She  still refuses anticoagulation. She also reports that she  had 2 episodes in September that she treated at home because the IV Cardizem left her feeling so weak. She is in SR today.   Today, she denies symptoms of palpitations, chest pain, shortness of breath, orthopnea, PND, lower extremity edema, dizziness, presyncope, syncope, or neurologic sequela. The patient is tolerating medications without difficulties and is otherwise without complaint today.   Past Medical History:  Diagnosis Date  . Atrial fibrillation (HCC)    mostly in SR on Tikosyn  . Cataract   . HTN (hypertension)   . Rosacea    Past Surgical History:  Procedure Laterality Date  . VARICOSE VEIN SURGERY      Current Outpatient Medications  Medication Sig Dispense Refill  . Cholecalciferol (VITAMIN D-3) 25 MCG (1000 UT) CAPS Take 1,000 Units by mouth daily.     Marland Kitchen dofetilide (TIKOSYN) 250 MCG capsule Take 1 capsule (250 mcg total) by mouth 2 (two)  times daily. 60 capsule 6  . lisinopril (ZESTRIL) 20 MG tablet Take 1 tablet (20 mg total) by mouth daily. 90 tablet 3  . MAGNESIUM PO Take 1 tablet by mouth daily as needed ("for low magnesium").    . potassium chloride SA (KLOR-CON M20) 20 MEQ tablet Take 0.5 tablets (10 mEq total) by mouth 2 (two) times daily. (Patient taking differently: Take 20 mEq by mouth daily. ) 90 tablet 2  . vitamin B-12 (CYANOCOBALAMIN) 100 MCG tablet Take 100 mcg by mouth daily.    Marland Kitchen diltiazem (CARDIZEM) 30 MG tablet Take 1 tablet every 4 hours AS NEEDED for AFIB heart rate >100 as long as blood pressure >100. 45 tablet 1   No current facility-administered medications for this encounter.    No Known Allergies  Social History   Socioeconomic History  . Marital status: Widowed    Spouse name: Not on file  . Number of children: Not on file  . Years of education: Not on file  . Highest education level: Not on file  Occupational History  . Not on file  Tobacco Use  . Smoking status: Never Smoker  . Smokeless tobacco: Never Used  Vaping Use  . Vaping Use: Never used  Substance and Sexual Activity  . Alcohol use: No  . Drug use: No  . Sexual activity: Not Currently  Other Topics Concern  . Not on file  Social History Narrative  . Not on file   Social Determinants of Health   Financial Resource Strain:   . Difficulty of Paying Living Expenses: Not on file  Food Insecurity:   . Worried About Programme researcher, broadcasting/film/video in the Last Year: Not on file  . Ran Out of Food in the Last Year: Not on file  Transportation Needs:   . Lack of Transportation (Medical): Not on file  . Lack of Transportation (Non-Medical): Not on file  Physical Activity:   . Days of Exercise per Week: Not on file  . Minutes of Exercise per Session: Not on file  Stress:   . Feeling of Stress : Not on file  Social Connections:   . Frequency of Communication with Friends and Family: Not on file  . Frequency of Social Gatherings with  Friends and Family: Not on file  . Attends Religious Services: Not on file  . Active Member of Clubs or Organizations: Not on file  . Attends Banker Meetings: Not on file  . Marital Status: Not on file  Intimate Partner Violence:   . Fear of Current or Ex-Partner: Not on file  . Emotionally Abused: Not on file  . Physically Abused: Not on file  . Sexually Abused: Not on file    Family History  Problem Relation Age of Onset  . Heart Problems Mother     ROS- All systems are reviewed and negative except as per the HPI above  Physical Exam: Vitals:   09/30/20 1410  BP: (!) 168/84  Pulse: 65  Weight: 54.3 kg  Height: 5\' 6"  (1.676 m)   Wt Readings from Last 3 Encounters:  09/30/20 54.3 kg  06/17/20 51.3 kg  05/19/20 54 kg    Labs: Lab Results  Component Value Date   NA 136 05/19/2020   K 4.6 05/19/2020   CL 101 05/19/2020   CO2 23 05/19/2020   GLUCOSE 94 05/19/2020   BUN 7 (L) 05/19/2020   CREATININE 0.70 05/19/2020   CALCIUM 9.4 05/19/2020   MG 2.0 05/19/2020   Lab Results  Component Value Date   INR 1.1 05/05/2020   Lab Results  Component Value Date   CHOL  07/02/2008    130        ATP III CLASSIFICATION:  <200     mg/dL   Desirable  07/04/2008  mg/dL   Borderline High  891-694    mg/dL   High   HDL 42 >=503   LDLCALC  07/02/2008    71        Total Cholesterol/HDL:CHD Risk Coronary Heart Disease Risk Table                     Men   Women  1/2 Average Risk   3.4   3.3   TRIG 87 07/02/2008     GEN- The patient is well appearing, alert and oriented x 3 today.   Head- normocephalic, atraumatic Eyes-  Sclera clear, conjunctiva pink Ears- hearing intact Oropharynx- clear Neck- supple, no JVP Lymph- no cervical lymphadenopathy Lungs- Clear to ausculation bilaterally, normal work of breathing Heart- Regular rate and rhythm, no murmurs, rubs or gallops, PMI not laterally displaced GI- soft, NT, ND, + BS Extremities- no clubbing,  cyanosis, or edema MS- no significant deformity or atrophy Skin- no rash or lesion Psych- euthymic mood, full affect Neuro- strength and sensation are intact  EKG-NSR, LAD,  at 56  bpm, pr int 184  ms, qrs int 120  ms, qtc 482 ms    Assessment and Plan: 1. Afib  Staying in SR, occasional breakthrough Recent ER visit in May, converted with IV cardizem,  and d/c home   2 episodes she treated at home in September, not prolonged  Continue Tikosyn 250 mcg and Cardizem 30 mg prn Chadsvasc score of at least 4, has had 2 significant nosebleeds that ENT has not been able to identify a source, had even with ASA dn refuses to go back on anticoagulation  Pt is clear in her decision, knowing stroke risk, that she will no longer take anticoagulation Bmet/mag today   2. LV dysfunction EF returned to normal with SR   Afib clinic in 6 months  Lupita Leash C[facility] [address] [place], [place] [address] [phone]

## 2020-10-25 ENCOUNTER — Other Ambulatory Visit: Payer: Self-pay

## 2020-10-25 ENCOUNTER — Emergency Department (HOSPITAL_COMMUNITY)
Admission: EM | Admit: 2020-10-25 | Discharge: 2020-10-25 | Disposition: A | Discharge status: Home / Self Care | Payer: Medicare HMO | Attending: Emergency Medicine | Admitting: Emergency Medicine

## 2020-10-25 ENCOUNTER — Emergency Department (HOSPITAL_COMMUNITY): Payer: Medicare HMO

## 2020-10-25 DIAGNOSIS — R000 Tachycardia, unspecified: Secondary | ICD-10-CM | POA: Diagnosis not present

## 2020-10-25 DIAGNOSIS — I480 Paroxysmal atrial fibrillation: Secondary | ICD-10-CM

## 2020-10-25 DIAGNOSIS — I4891 Unspecified atrial fibrillation: Secondary | ICD-10-CM | POA: Diagnosis not present

## 2020-10-25 DIAGNOSIS — I10 Essential (primary) hypertension: Secondary | ICD-10-CM | POA: Diagnosis not present

## 2020-10-25 DIAGNOSIS — Z20822 Contact with and (suspected) exposure to covid-19: Secondary | ICD-10-CM | POA: Insufficient documentation

## 2020-10-25 DIAGNOSIS — E039 Hypothyroidism, unspecified: Secondary | ICD-10-CM | POA: Diagnosis not present

## 2020-10-25 DIAGNOSIS — R079 Chest pain, unspecified: Secondary | ICD-10-CM | POA: Diagnosis present

## 2020-10-25 DIAGNOSIS — Z79899 Other long term (current) drug therapy: Secondary | ICD-10-CM | POA: Insufficient documentation

## 2020-10-25 LAB — TSH: TSH: 5.291 u[IU]/mL — ABNORMAL HIGH (ref 0.350–4.500)

## 2020-10-25 LAB — BASIC METABOLIC PANEL
Anion gap: 8 (ref 5–15)
BUN: 12 mg/dL (ref 8–23)
CO2: 23 mmol/L (ref 22–32)
Calcium: 9.2 mg/dL (ref 8.9–10.3)
Chloride: 107 mmol/L (ref 98–111)
Creatinine, Ser: 0.72 mg/dL (ref 0.44–1.00)
GFR, Estimated: >60 mL/min (ref >60)
Glucose, Bld: 120 mg/dL — ABNORMAL HIGH (ref 70–99)
Potassium: 3.8 mmol/L (ref 3.5–5.1)
Sodium: 138 mmol/L (ref 135–145)

## 2020-10-25 LAB — CBC
HCT: 40.8 % (ref 36.0–46.0)
Hemoglobin: 12.8 g/dL (ref 12.0–15.0)
MCH: 29.1 pg (ref 26.0–34.0)
MCHC: 31.4 g/dL (ref 30.0–36.0)
MCV: 92.7 fL (ref 80.0–100.0)
Platelets: 151 10*3/uL (ref 150–400)
RBC: 4.4 MIL/uL (ref 3.87–5.11)
RDW: 13.2 % (ref 11.5–15.5)
WBC: 5.3 10*3/uL (ref 4.0–10.5)
nRBC: 0 % (ref 0.0–0.2)

## 2020-10-25 LAB — MAGNESIUM: Magnesium: 2.1 mg/dL (ref 1.7–2.4)

## 2020-10-25 LAB — RESPIRATORY PANEL BY RT PCR (FLU A&B, COVID)
Influenza A by PCR: NEGATIVE
Influenza B by PCR: NEGATIVE
SARS Coronavirus 2 by RT PCR: NEGATIVE

## 2020-10-25 MED ORDER — SODIUM CHLORIDE 0.9 % IV BOLUS
500.0000 mL | Freq: Once | INTRAVENOUS | Status: AC
  Administered 2020-10-25: 500 mL via INTRAVENOUS

## 2020-10-25 MED ORDER — DILTIAZEM HCL-DEXTROSE 125-5 MG/125ML-% IV SOLN (PREMIX)
5.0000 mg/h | INTRAVENOUS | Status: DC
  Administered 2020-10-25: 5 mg/h via INTRAVENOUS
  Filled 2020-10-25: qty 125

## 2020-10-25 MED ORDER — DOFETILIDE 250 MCG PO CAPS
250.0000 ug | ORAL_CAPSULE | Freq: Two times a day (BID) | ORAL | Status: DC
  Administered 2020-10-25: 250 ug via ORAL
  Filled 2020-10-25 (×2): qty 1

## 2020-10-25 MED ORDER — METOPROLOL TARTRATE 25 MG PO TABS: 25.0000 mg | ORAL_TABLET | ORAL | 6 refills | Status: DC | PRN

## 2020-10-25 NOTE — Discharge Instructions (Addendum)
You were in A. fib today in the ER but you converted back to a normal rhythm with the medications that we gave. You will need to follow-up in the A. fib clinic as recommended by the cardiologist and also take the metoprolol as needed as recommended as well. Follow-up with your primary care provider as well. Return to the ER if you start to experience chest pain, shortness of breath, worsening palpitations, leg swelling or loss of consciousness, lightheadedness.

## 2020-10-25 NOTE — ED Notes (Signed)
Pt d/c home per MD order. Discharge summary reviewed with pt, pt verbalizes understanding. Reports son is discharge ride home. No s/s of acute distress noted.

## 2020-10-25 NOTE — Consult Note (Addendum)
ELECTROPHYSIOLOGY CONSULT NOTE    Patient ID: Amanda Olsen MRN: [id number], DOB/AGE: 02-13-1934 86 y.o.  Admit date: 10/25/2020 Date of Consult: 10/25/2020  Primary Physician: [name], DO Primary Cardiologist: Charlton Haws, MD  Electrophysiologist: Dr. Elberta Fortis  Referring Provider: Dr Recardo Evangelist is a 86 y.o. female with a history of HTN and PAF who is being seen today for the evaluation of tachy-brady syndrome at the request of the Dr Bebe Shaggy.  HPI:  Amanda Olsen is a 86 y.o. female followed by Dr. Elberta Fortis and AF clinic on tikosyn. She has previously stopped OAC due to h/o nose bleeds despite cautery.   Last seen in AF clinic 09/30/20. She previously had an AF episode in May that responded quickly to IV diltiazem. She continued to refuse OAC. She felt as if she had 2 episodes in 08/2020 that resolved at home with po diltiazem (IV cardizem made her feel weak so she did not go to ED). She was in SR at that time.   Echo 04/2020 LVEF 50-55%.  She presented to [facility] after being awakened by a fluttering sensation in her chest. EMS arrived and found pt to be in AF with RVR 120-190s during transport. Given 2 boluses of 10 mg Cardizem and then placed on gtt.   Pt converted to sinus bradycardia on cardizem gtt with HRs as low as 38 immediately after, but then gradually back up into 50-60s (baseline) EP asked to see for plan and disposition.   She denies chest pain, palpitations, dyspnea, PND, orthopnea, nausea, vomiting, dizziness, syncope, edema, weight gain, or early satiety.  Past Medical History:  Diagnosis Date   Atrial fibrillation (HCC)    mostly in SR on Tikosyn   Cataract    HTN (hypertension)    Rosacea      Surgical History:  Past Surgical History:  Procedure Laterality Date   VARICOSE VEIN SURGERY       (Not in a hospital admission)   Inpatient Medications:   dofetilide  250 mcg Oral BID    Allergies:  Allergies  Allergen  Reactions   Other Itching and Other (See Comments)    Dust and seasonal allergies- itchy eyes, runny nose, etc..    Social History   Socioeconomic History   Marital status: Widowed    Spouse name: Not on file   Number of children: Not on file   Years of education: Not on file   Highest education level: Not on file  Occupational History   Not on file  Tobacco Use   Smoking status: Never Smoker   Smokeless tobacco: Never Used  Vaping Use   Vaping Use: Never used  Substance and Sexual Activity   Alcohol use: No   Drug use: No   Sexual activity: Not Currently  Other Topics Concern   Not on file  Social History Narrative   Not on file   Social Determinants of Health   Financial Resource Strain:    Difficulty of Paying Living Expenses: Not on file  Food Insecurity:    Worried About Running Out of Food in the Last Year: Not on file   Ran Out of Food in the Last Year: Not on file  Transportation Needs:    Lack of Transportation (Medical): Not on file   Lack of Transportation (Non-Medical): Not on file  Physical Activity:    Days of Exercise per Week: Not on file   Minutes of Exercise per Session: Not on file  Stress:    Feeling of Stress : Not on file  Social Connections:    Frequency of Communication with Friends and Family: Not on file   Frequency of Social Gatherings with Friends and Family: Not on file   Attends Religious Services: Not on file   Active Member of Clubs or Organizations: Not on file   Attends Banker Meetings: Not on file   Marital Status: Not on file  Intimate Partner Violence:    Fear of Current or Ex-Partner: Not on file   Emotionally Abused: Not on file   Physically Abused: Not on file   Sexually Abused: Not on file     Family History  Problem Relation Age of Onset   Heart Problems Mother      Review of Systems: All other systems reviewed and are otherwise negative except as noted above.  Physical Exam: Vitals:   10/25/20  0930 10/25/20 1000 10/25/20 1004 10/25/20 1015  BP: 102/74 112/62 114/78 125/77  Pulse: (!) 106 74 (!) 34 (!) 56  Resp: 13 11 12 15   Temp:      TempSrc:      SpO2: 98% 98% 97% 99%  Weight:      Height:        GEN- The patient is well appearing, alert and oriented x 3 today.   HEENT: normocephalic, atraumatic; sclera clear, conjunctiva pink; hearing intact; oropharynx clear; neck supple Lungs- Clear to ausculation bilaterally, normal work of breathing.  No wheezes, rales, rhonchi Heart- Regular rate and rhythm, no murmurs, rubs or gallops GI- soft, non-tender, non-distended, bowel sounds present Extremities- no clubbing, cyanosis, or edema; DP/PT/radial pulses 2+ bilaterally MS- no significant deformity or atrophy Skin- warm and dry, no rash or lesion Psych- euthymic mood, full affect Neuro- strength and sensation are intact  Labs:   Lab Results  Component Value Date   WBC 5.3 10/25/2020   HGB 12.8 10/25/2020   HCT 40.8 10/25/2020   MCV 92.7 10/25/2020   PLT 151 10/25/2020    Recent Labs  Lab 10/25/20 0601  NA 138  K 3.8  CL 107  CO2 23  BUN 12  CREATININE 0.72  CALCIUM 9.2  GLUCOSE 120*      Radiology/Studies: DG Chest Port 1 View  Result Date: 10/25/2020 CLINICAL DATA:  Atrial fibrillation EXAM: PORTABLE CHEST 1 VIEW COMPARISON:  05/05/2020 FINDINGS: The heart size and mediastinal contours are within normal limits. Both lungs are clear. The visualized skeletal structures are unremarkable. IMPRESSION: No active disease. Electronically Signed   By: 05/07/2020 MD   On: 10/25/2020 06:13    EKG: Most recent EKG today appears to show sinus brady at 51 bpm EKG on arrival showed AF with RVR at 152 bpm   (personally reviewed)  TELEMETRY: AF 140-150s and converted to sinus brady on cardizem gtt with sinus slowing that gradually improved into 50-60s (personally reviewed)  Assessment/Plan: 1.  Paroxysmal AF On tikosyn and prn diltiazem at home She has continued  to refuse OAC despite CHA2DS2VASC of 4 due to recurrent nose bleeds despite cauterization x 2 and no clear source.     STOP diltiazem as this has been ineffective in the past for her breakthrough AF.  We Purva Vessell give 25 mg lopressor to take as needed for breakthrough.  We discussed briefly switching to amiodarone. We would like to have one more change on tikosyn due to the difficulty of loading. QTc stable on EKG today.   2. HTN BP stable  in 90-130s. Latrisha Coiro plan on resuming lisinopril.   She is OK for home from EP perspective with close AF clinic follow up that has been scheduled. Dr. Elberta Fortis has seen the patient.   For questions or updates, please contact [facility] Please consult [email or web address] for contact info under Cardiology/STEMI.  Signed, Graciella Freer, PA-C  10/25/2020 10:29 AM   I have seen and examined this patient with Otilio Saber.  Agree with above, note added to reflect my findings.  On exam, RRR, no murmurs. Presented with rapid AF. Converted to sinus rhythm with IV diltiazem. Allora Bains arrange follow up in AF clinic and provide metoprolol to take PRN. Would continue her tikosyn at the current dose. If she has more frequent episodes of AF, may require switching to amiodarone.    Miaa Latterell M. Marlise Fahr MD 10/25/2020 11:43 AM

## 2020-10-25 NOTE — ED Notes (Signed)
EKG given to EDP Knapp, notified Cardizem stopped d/t to HR, EDP provider to bedside

## 2020-10-25 NOTE — ED Provider Notes (Signed)
[facility] EMERGENCY DEPARTMENT Provider Note   CSN: [id number] Arrival date & time: 10/25/20  4709     History Chief Complaint  Patient presents with  . Atrial Fibrillation    Amanda Olsen is a 86 y.o. female.   86 year old female with a history of hypertension and PAF (no longer chronically anticoagulated) presents to the emergency department from home.  Woke up from sleep feeling a fluttering sensation in her chest.  This has largely subsided spontaneously.  Notes no modifying factors of these symptoms.  No medications taken prior to arrival.  She denies lightheadedness, syncope, chest pain, shortness of breath, nausea, vomiting.  Was found to be in A. fib with RVR upon EMS arrival.  She was given 2 boluses of 10 mg Cardizem with no change.  Subsequently placed on a Cardizem drip at 5 mg/hr.  Heart rate ranging from 120-190bpm during transport.  The history is provided by the patient. No language interpreter was used.  Atrial Fibrillation       Past Medical History:  Diagnosis Date  . Atrial fibrillation (HCC)    mostly in SR on Tikosyn  . Cataract   . HTN (hypertension)   . Rosacea     Patient Active Problem List   Diagnosis Date Noted  . Atrial fibrillation with rapid ventricular response (HCC) 05/05/2020  . Visit for monitoring Tikosyn therapy 07/09/2017  . Hypothyroid 11/16/2011  . ELECTROCARDIOGRAM, ABNORMAL 05/02/2010  . ESSENTIAL HYPERTENSION, BENIGN 04/15/2009  . ROSACEA 04/15/2009  . PAF (paroxysmal atrial fibrillation) (HCC) 10/16/2008    Past Surgical History:  Procedure Laterality Date  . VARICOSE VEIN SURGERY       OB History   No obstetric history on file.     Family History  Problem Relation Age of Onset  . Heart Problems Mother     Social History   Tobacco Use  . Smoking status: Never Smoker  . Smokeless tobacco: Never Used  Vaping Use  . Vaping Use: Never used  Substance Use Topics  . Alcohol use: No  .  Drug use: No    Home Medications Prior to Admission medications   Medication Sig Start Date End Date Taking? Authorizing Provider  Cholecalciferol (VITAMIN D-3) 25 MCG (1000 UT) CAPS Take 1,000 Units by mouth daily.     [provider]  diltiazem (CARDIZEM) 30 MG tablet Take 1 tablet every 4 hours AS NEEDED for AFIB heart rate >100 as long as blood pressure >100. 03/22/18   Newman Nip, NP  dofetilide (TIKOSYN) 250 MCG capsule Take 1 capsule (250 mcg total) by mouth 2 (two) times daily. 07/08/20   Newman Nip, NP  lisinopril (ZESTRIL) 20 MG tablet Take 1 tablet (20 mg total) by mouth daily. 06/17/20 09/30/20  Sheilah Pigeon, PA-C  MAGNESIUM PO Take 1 tablet by mouth daily as needed ("for low magnesium").    [provider]  potassium chloride SA (KLOR-CON M20) 20 MEQ tablet Take 0.5 tablets (10 mEq total) by mouth 2 (two) times daily. Patient taking differently: Take 20 mEq by mouth daily.  09/21/20   Newman Nip, NP  vitamin B-12 (CYANOCOBALAMIN) 100 MCG tablet Take 100 mcg by mouth daily.    [provider]    Allergies    Patient has no known allergies.  Review of Systems   Review of Systems  Ten systems reviewed and are negative for acute change, except as noted in the HPI.    Physical Exam  Updated Vital Signs BP 131/89   Pulse (!) 123   Temp 98.3 F (36.8 C) (Oral)   Resp (!) 27   Ht 5\' 6"  (1.676 m)   Wt 58.5 kg   SpO2 97%   BMI 20.82 kg/m   Physical Exam Vitals and nursing note reviewed.  Constitutional:      General: She is not in acute distress.    Appearance: She is well-developed. She is not diaphoretic.     Comments: Nontoxic appearing and in NAD  HENT:     Head: Normocephalic and atraumatic.  Eyes:     General: No scleral icterus.    Conjunctiva/sclera: Conjunctivae normal.  Cardiovascular:     Rate and Rhythm: Tachycardia present. Rhythm irregularly irregular.     Pulses: Normal pulses.     Comments: HR  120-160bpm Pulmonary:     Effort: Pulmonary effort is normal. No respiratory distress.     Breath sounds: No stridor. No wheezing.     Comments: Lungs CTAB. Respirations even and unlabored. Musculoskeletal:        General: Normal range of motion.     Cervical back: Normal range of motion.  Skin:    General: Skin is warm and dry.     Coloration: Skin is not pale.     Findings: No erythema or rash.  Neurological:     Mental Status: She is alert and oriented to person, place, and time.     Coordination: Coordination normal.  Psychiatric:        Behavior: Behavior normal.     ED Results / Procedures / Treatments   Labs (all labs ordered are listed, but only abnormal results are displayed) Labs Reviewed  CBC  BASIC METABOLIC PANEL  MAGNESIUM  TSH    EKG EKG Interpretation  Date/Time:  Monday October 25 2020 05:42:31 EST Ventricular Rate:  152 PR Interval:    QRS Duration: 115 QT Interval:  312 QTC Calculation: 497 R Axis:   49 Text Interpretation: Atrial fibrillation with rapid V-rate Ventricular premature complex LVH with secondary repolarization abnormality Probable anterior infarct, old When compared with ECG of 09/30/2020, Atrial fibrillation with rapid ventricular response has replaced Sinus rhythm Confirmed by 10/02/2020 (Dione Booze) on 10/25/2020 5:48:48 AM   Radiology DG Chest Port 1 View  Result Date: 10/25/2020 CLINICAL DATA:  Atrial fibrillation EXAM: PORTABLE CHEST 1 VIEW COMPARISON:  05/05/2020 FINDINGS: The heart size and mediastinal contours are within normal limits. Both lungs are clear. The visualized skeletal structures are unremarkable. IMPRESSION: No active disease. Electronically Signed   By: 05/07/2020 MD   On: 10/25/2020 06:13    Procedures .Critical Care Performed by: 10/27/2020, PA-C Authorized by: Antony Madura, PA-C   Critical care provider statement:    Critical care time (minutes):  45   Critical care was necessary to treat or  prevent imminent or life-threatening deterioration of the following conditions: Afib w/RVR.   Critical care was time spent personally by me on the following activities:  Discussions with consultants, evaluation of patient's response to treatment, examination of patient, ordering and performing treatments and interventions, ordering and review of laboratory studies, ordering and review of radiographic studies, pulse oximetry, re-evaluation of patient's condition, obtaining history from patient or surrogate and review of old charts   (including critical care time)  Medications Ordered in ED Medications  diltiazem (CARDIZEM) 125 mg in dextrose 5% 125 mL (1 mg/mL) infusion (5 mg/hr Intravenous New Bag/Given 10/25/20 0612)  dofetilide (TIKOSYN) capsule 250  mcg (250 mcg Oral Given 10/25/20 1610)  sodium chloride 0.9 % bolus 500 mL (500 mLs Intravenous New Bag/Given 10/25/20 9604)    ED Course  I have reviewed the triage vital signs and the nursing notes.  Pertinent labs & imaging results that were available during my care of the patient were reviewed by me and considered in my medical decision making (see chart for details).    MDM Rules/Calculators/A&P                          86 year old female presenting in A. fib with RVR.  Awoke from sleep with a fluttering sensation in her chest.  Given two boluses of Cardizem by EMS in route to the ED.  Currently on a Cardizem drip to attempt rate control.  She is hemodynamically stable. AM Tikosyn ordered as patient takes this BID.  May require admission if A. fib with RVR persists.  She is not chronically anticoagulated and has been off anticoagulation for approximately a year.  Care signed out to [place], [place] at shift change.   Final Clinical Impression(s) / ED Diagnoses Final diagnoses:  Atrial fibrillation with RVR [facility])    Rx / DC Orders ED Discharge Orders    None       Antony Madura, PA-C 10/25/20 5409    Zadie Rhine, MD  10/26/20 [phone]

## 2020-10-25 NOTE — ED Provider Notes (Addendum)
Physical Exam  BP 102/69   Pulse (!) 55   Temp 98.3 F (36.8 C) (Oral)   Resp (!) 21   Ht 5\' 6"  (1.676 m)   Wt 58.5 kg   SpO2 98%   BMI 20.82 kg/m   Physical Exam Vitals and nursing note reviewed.  Constitutional:      General: She is not in acute distress.    Appearance: She is well-developed. She is not diaphoretic.  HENT:     Head: Normocephalic and atraumatic.  Eyes:     General: No scleral icterus.    Conjunctiva/sclera: Conjunctivae normal.  Pulmonary:     Effort: Pulmonary effort is normal. No respiratory distress.  Musculoskeletal:     Cervical back: Normal range of motion.  Skin:    Findings: No rash.  Neurological:     Mental Status: She is alert.     ED Course/Procedures   Clinical Course as of Oct 25 1112  Mon Oct 25, 2020  Oct 27, 2020 Pulse Rate(!): 117 [HK]  0701 Magnesium: 2.1 [HK]  0701 Potassium: 3.8 [HK]  0753 Pulse Rate(!): 115 [HK]  0825 Pulse Rate(!): 124 [HK]  1012 Pulse Rate(!): 55 [HK]    Clinical Course User Index [HK] 6073, PA-C    Procedures  MDM   Care of patient assumed from PA Humes at 6:30 AM.  Agree with history, physical exam and plan.  See their note for further details.  Briefly, 86 y.o. female with PMH/PSH as below who presents with palpitations that began when she woke up from her sleep.  Reports fluttering sensation in her chest.  No chest pain, shortness of breath, lightheadedness, syncope.  She was found to be in A. fib with RVR upon EMS arrival and given a total of 20 mg of Cardizem without change.  She was placed on a Cardizem drip and remained in A. fib with RVR during transport with heart rates in the 120-190 upon arrival to the ER. Patient has been off of her anticoagulation for about a year due to frequent nosebleeds. Reports compliance with her other medications. EKG here shows A. fib with RVR. CXR unremarkable.  Past Medical History:  Diagnosis Date  . Atrial fibrillation (HCC)    mostly in SR on Tikosyn   . Cataract   . HTN (hypertension)   . Rosacea    Past Surgical History:  Procedure Laterality Date  . VARICOSE VEIN SURGERY       Current Plan: Follow-up on remainder of lab work.  Reassess after labs.  Consider admission if she does not convert to sinus rhythm with the Cardizem drip.   MDM/ED Course: 6:58 AM Lab work including CBC, BMP and magnesium unremarkable.  TSH is pending.  Assessed patient at the bedside and continues to have no change in her symptoms and remains in A. fib with RVR with a heart rate in 120s. Chart review shows that patient had a similar episode about 6 months ago requiring admission for about 1 day before she converted to sinus rhythm.  At the time she continued to adamantly refuse anticoagulation despite risks.  7:26 AM TSH slightly elevated at 5.2, has been elevated in the past.  Continues to be in A. fib with heart rates up to the 140s.  She will be increased to 15 mg cardizem gtt.  7:53 AM Patient remains in A. fib with RVR.  At this point Cardizem has been increased to 50 mg.  Will admit to cardiology for further management of  her A. fib with RVR. Patient updated with plan of care and she agrees.  She remains otherwise hemodynamically stable.  10:12 AM Patient appears to be in sinus rhythm on monitor.  Heart rates have improved to 70s.  11:10 AM Patient evaluated at the bedside by cardiology.  They recommend discharge home and follow-up in A. fib clinic.  They will add as needed metoprolol for her to take.  She remains in sinus rhythm with heart rates in the 60s.  She is comfortable with going home and she would prefer this.  She will continue taking her medications as previously prescribed and follow-up in A. fib clinic. Appreciate cardiology evaluating patient at bedside.  Consults: Cardiology- Dr. Elberta Fortis   Significant labs/images:  Labs Reviewed  BASIC METABOLIC PANEL - Abnormal; Notable for the following components:      Result Value    Glucose, Bld 120 (*)    All other components within normal limits  TSH - Abnormal; Notable for the following components:   TSH 5.291 (*)    All other components within normal limits  RESPIRATORY PANEL BY RT PCR (FLU A&B, COVID)  MAGNESIUM  CBC     I personally reviewed and interpreted all labs.  All imaging, if done today, including plain films, CT scans, and ultrasounds, independently reviewed by me, and interpretations confirmed via formal radiology reads.  Patient is hemodynamically stable, in NAD. Evaluation does not show pathology that would require ongoing emergent intervention or inpatient treatment. I explained the diagnosis to the patient. Pain has been managed and has no complaints prior to discharge. Patient is comfortable with above plan and is stable for discharge at this time. All questions were answered prior to disposition. Strict return precautions for returning to the ED were discussed. Encouraged follow up with PCP.   An After Visit Summary was printed and given to the patient.   Portions of this note were generated with Scientist, clinical (histocompatibility and immunogenetics). Dictation errors may occur despite best attempts at proofreading.        Dietrich Pates, PA-C 10/25/20 1116    Zadie Rhine, MD 10/26/20 [phone]

## 2020-10-25 NOTE — ED Provider Notes (Signed)
Patient seen/examined in the Emergency Department in conjunction with Advanced Practice Provider [facility] Patient reports atrial fibrillation with long h/o similar episodes.  History limited due to language barrier Exam : awake/alert, no distress, tachycardic and irregular Plan: will start rate control with cardizem    Zadie Rhine, MD 10/26/20 [phone]

## 2020-10-25 NOTE — ED Notes (Signed)
Cardiology at bedside.

## 2020-10-25 NOTE — ED Notes (Signed)
Pt HR noted to be 38 on EKG monitor. Pt remains alert and oriented. EKG captured. Cardizem stopped. Primary RN notified.

## 2020-10-25 NOTE — ED Triage Notes (Signed)
BIB [facility] from Home. Pt has history of afib. Woke up from her sleep with a flutter feeling in her chest. No complaints of chest pain or SOB. EMS gave 2 bolus of Cardizem and no changes. EMS started pt on 5mg /hr Cardizem drip. Heart rate with EMS is going between 120-190.

## 2020-11-01 ENCOUNTER — Ambulatory Visit (HOSPITAL_COMMUNITY)
Admit: 2020-11-01 | Discharge: 2020-11-01 | Disposition: A | Discharge status: Home / Self Care | Payer: Medicare HMO | Attending: Physician Assistant | Admitting: Physician Assistant

## 2020-11-01 ENCOUNTER — Encounter (HOSPITAL_COMMUNITY): Payer: Self-pay | Admitting: Physician Assistant

## 2020-11-01 ENCOUNTER — Other Ambulatory Visit: Payer: Self-pay

## 2020-11-01 VITALS — BP 170/88 | HR 69 | Ht 66.0 in | Wt 121.2 lb

## 2020-11-01 DIAGNOSIS — I10 Essential (primary) hypertension: Secondary | ICD-10-CM | POA: Diagnosis not present

## 2020-11-01 DIAGNOSIS — Z5329 Procedure and treatment not carried out because of patient's decision for other reasons: Secondary | ICD-10-CM | POA: Diagnosis not present

## 2020-11-01 DIAGNOSIS — D6869 Other thrombophilia: Secondary | ICD-10-CM | POA: Diagnosis not present

## 2020-11-01 DIAGNOSIS — I480 Paroxysmal atrial fibrillation: Secondary | ICD-10-CM | POA: Insufficient documentation

## 2020-11-01 DIAGNOSIS — R040 Epistaxis: Secondary | ICD-10-CM | POA: Diagnosis not present

## 2020-11-01 DIAGNOSIS — Z79899 Other long term (current) drug therapy: Secondary | ICD-10-CM | POA: Diagnosis not present

## 2020-11-01 NOTE — Progress Notes (Signed)
Primary Care Physician: [name], DO Referring Physician: [facility] ER f/u Cardiologist: Dr. Eden Emms EP: Dr. [name] is a 86 y.o. female with a h/o paroxysmal afib in the afib clinic for f/u  tikosyn use. Reports no afib. She feels well. She is not on anticoagulation per her choice as she had repeat nose bleeds despite having cautery x 2. She was told the risks of stroke being off anticoagulation and she was clear she did not want to be on blood thinners any longer.  F/u afib clinic, 4/22 for an ER visit last week for afib with RVR. She spontaneously converted with a cardizem drip. Her K+ was low at 3.3, pt had stopped taking and was placed back on K +. She remains off anticoagulation 2/2 issues with nosebleeds a while back and clear in her decision not to take anticoagulation. She is in SR today.   F/u in afib clinic, 09/30/20. Pt reports that she had an episode of afib in May and IV diltiazem was given to which pt converted quickly. She was d/c home. She  still refuses anticoagulation. She also reports that she  had 2 episodes in September that she treated at home because the IV Cardizem left her feeling so weak. She is in SR today.   Follow up in the AF clinic 11/01/20. Patient was seen at the ED 10/25/20 for a fluttering sensation in her chest. She ws in afib with RVR, converted with bolus of diltiazem. Her PRN diltiazem was changed to metoprolol. She denies any further episodes of afib.   Today, she denies symptoms of palpitations, chest pain, shortness of breath, orthopnea, PND, lower extremity edema, dizziness, presyncope, syncope, or neurologic sequela. The patient is tolerating medications without difficulties and is otherwise without complaint today.   Past Medical History:  Diagnosis Date  . Atrial fibrillation (HCC)    mostly in SR on Tikosyn  . Cataract   . HTN (hypertension)   . Rosacea    Past Surgical History:  Procedure Laterality Date  . VARICOSE VEIN  SURGERY      Current Outpatient Medications  Medication Sig Dispense Refill  . Cholecalciferol (VITAMIN D-3) 25 MCG (1000 UT) CAPS Take 1,000 Units by mouth daily.     Marland Kitchen dofetilide (TIKOSYN) 250 MCG capsule Take 1 capsule (250 mcg total) by mouth 2 (two) times daily. 60 capsule 6  . lisinopril (ZESTRIL) 20 MG tablet Take 1 tablet (20 mg total) by mouth daily. (Patient taking differently: Take 20 mg by mouth every evening. ) 90 tablet 3  . MAGNESIUM PO Take 1 tablet by mouth daily as needed ("for low magnesium").    . metoprolol tartrate (LOPRESSOR) 25 MG tablet Take 1 tablet (25 mg total) by mouth as needed (for fast heart rates / breakthrough atrial fibrillation. May take a second tab 30 minutes later if initial dose does not improve heart rate). 30 tablet 6  . Polyethyl Glycol-Propyl Glycol (SYSTANE HYDRATION PF OP) Place 1 drop into both eyes 3 (three) times daily as needed (for dryness).    . potassium chloride SA (KLOR-CON M20) 20 MEQ tablet Take 0.5 tablets (10 mEq total) by mouth 2 (two) times daily. (Patient taking differently: Take 20 mEq by mouth in the morning. ) 90 tablet 2  . VALERIAN ROOT PO Take 1 capsule by mouth daily as needed (to help improve sleep, promote relaxation, and/or reduce anxiety).    . vitamin B-12 (CYANOCOBALAMIN) 100 MCG tablet Take 100 mcg  by mouth daily.     No current facility-administered medications for this visit.    Allergies  Allergen Reactions  . Other Itching and Other (See Comments)    Dust and seasonal allergies- itchy eyes, runny nose, etc..    Social History   Socioeconomic History  . Marital status: Widowed    Spouse name: Not on file  . Number of children: Not on file  . Years of education: Not on file  . Highest education level: Not on file  Occupational History  . Not on file  Tobacco Use  . Smoking status: Never Smoker  . Smokeless tobacco: Never Used  Vaping Use  . Vaping Use: Never used  Substance and Sexual Activity  .  Alcohol use: No  . Drug use: No  . Sexual activity: Not Currently  Other Topics Concern  . Not on file  Social History Narrative  . Not on file   Social Determinants of Health   Financial Resource Strain:   . Difficulty of Paying Living Expenses: Not on file  Food Insecurity:   . Worried About Programme researcher, broadcasting/film/video in the Last Year: Not on file  . Ran Out of Food in the Last Year: Not on file  Transportation Needs:   . Lack of Transportation (Medical): Not on file  . Lack of Transportation (Non-Medical): Not on file  Physical Activity:   . Days of Exercise per Week: Not on file  . Minutes of Exercise per Session: Not on file  Stress:   . Feeling of Stress : Not on file  Social Connections:   . Frequency of Communication with Friends and Family: Not on file  . Frequency of Social Gatherings with Friends and Family: Not on file  . Attends Religious Services: Not on file  . Active Member of Clubs or Organizations: Not on file  . Attends Banker Meetings: Not on file  . Marital Status: Not on file  Intimate Partner Violence:   . Fear of Current or Ex-Partner: Not on file  . Emotionally Abused: Not on file  . Physically Abused: Not on file  . Sexually Abused: Not on file    Family History  Problem Relation Age of Onset  . Heart Problems Mother     ROS- All systems are reviewed and negative except as per the HPI above  Physical Exam: There were no vitals filed for this visit. Wt Readings from Last 3 Encounters:  10/25/20 58.5 kg  09/30/20 54.3 kg  06/17/20 51.3 kg    Labs: Lab Results  Component Value Date   NA 138 10/25/2020   K 3.8 10/25/2020   CL 107 10/25/2020   CO2 23 10/25/2020   GLUCOSE 120 (H) 10/25/2020   BUN 12 10/25/2020   CREATININE 0.72 10/25/2020   CALCIUM 9.2 10/25/2020   MG 2.1 10/25/2020   Lab Results  Component Value Date   INR 1.1 05/05/2020   Lab Results  Component Value Date   CHOL  07/02/2008    130        ATP III  CLASSIFICATION:  <200     mg/dL   Desirable  097-353  mg/dL   Borderline High  >=299    mg/dL   High   HDL 42 24/26/8341   LDLCALC  07/02/2008    71        Total Cholesterol/HDL:CHD Risk Coronary Heart Disease Risk Table  Men   Women  1/2 Average Risk   3.4   3.3   TRIG 87 07/02/2008    GEN- The patient is well appearing elderly female, alert and oriented x 3 today.   HEENT-head normocephalic, atraumatic, sclera clear, conjunctiva pink, hearing intact, trachea midline. Lungs- Clear to ausculation bilaterally, normal work of breathing Heart- Regular rate and rhythm, no murmurs, rubs or gallops  GI- soft, NT, ND, + BS Extremities- no clubbing, cyanosis, or edema MS- no significant deformity or atrophy Skin- no rash or lesion Psych- euthymic mood, full affect Neuro- strength and sensation are intact   EKG- SR HR 69, LBBB, PVC, PR 178, QRS 122, QTc 488   Assessment and Plan: 1. Paroxysmal Afib  Recently seen at the ED 10/25/20 Continue Tikosyn 250 mcg. QT stable. May need to consider switching to amiodarone if afib becomes more persistent.  Continue Lopressor 25 mg PRN for heart racing. Chadsvasc score of at least 4, has had 2 significant nosebleeds that ENT has not been able to identify a source. Patient continues to decline anticoagulation.   2. LV dysfunction EF returned to normal with SR No signs or symptoms of fluid overload.  3. HTN Elevated today, patient reports it has been WNL at home until this morning. I have asked her to keep a BP log for review. May need to increase ACE-I.   Follow up with Rudi Coco per recall.   Jorja Loa PA-C Afib Clinic [facility] [address] [place], [place] [address] [phone]

## 2020-11-03 DIAGNOSIS — E860 Dehydration: Secondary | ICD-10-CM | POA: Diagnosis not present

## 2020-11-03 DIAGNOSIS — R531 Weakness: Secondary | ICD-10-CM | POA: Diagnosis not present

## 2020-11-03 DIAGNOSIS — I499 Cardiac arrhythmia, unspecified: Secondary | ICD-10-CM | POA: Diagnosis not present

## 2020-11-03 DIAGNOSIS — R000 Tachycardia, unspecified: Secondary | ICD-10-CM | POA: Diagnosis not present

## 2020-11-03 DIAGNOSIS — I4891 Unspecified atrial fibrillation: Secondary | ICD-10-CM | POA: Diagnosis not present

## 2020-11-11 ENCOUNTER — Telehealth (HOSPITAL_COMMUNITY): Payer: Self-pay | Admitting: *Deleted

## 2020-11-11 MED ORDER — LISINOPRIL 20 MG PO TABS: 20.0000 mg | ORAL_TABLET | Freq: Two times a day (BID) | ORAL | 3 refills | Status: DC

## 2020-11-11 NOTE — Telephone Encounter (Signed)
Patient called in stating her BPs have been running in the 180s/100s at home the last few days feeling intermittently weak.  Per Rudi Coco NP will increase lisinopril to 40mg  a day - pt would prefer 20mg  BID. Will bring in next week for BP/BMET. Pt verbalized understanding.

## 2020-11-18 ENCOUNTER — Ambulatory Visit (HOSPITAL_COMMUNITY)
Admission: RE | Admit: 2020-11-18 | Discharge: 2020-11-18 | Disposition: A | Discharge status: Home / Self Care | Payer: Medicare HMO | Source: Ambulatory Visit | Attending: Physician Assistant | Admitting: Physician Assistant

## 2020-11-18 ENCOUNTER — Other Ambulatory Visit (HOSPITAL_COMMUNITY): Payer: Self-pay | Admitting: *Deleted

## 2020-11-18 ENCOUNTER — Other Ambulatory Visit: Payer: Self-pay

## 2020-11-18 VITALS — BP 146/70

## 2020-11-18 DIAGNOSIS — I480 Paroxysmal atrial fibrillation: Secondary | ICD-10-CM | POA: Insufficient documentation

## 2020-11-18 DIAGNOSIS — D6869 Other thrombophilia: Secondary | ICD-10-CM

## 2020-11-18 DIAGNOSIS — I10 Essential (primary) hypertension: Secondary | ICD-10-CM | POA: Diagnosis not present

## 2020-11-18 DIAGNOSIS — I519 Heart disease, unspecified: Secondary | ICD-10-CM | POA: Insufficient documentation

## 2020-11-18 LAB — HEPATIC FUNCTION PANEL
ALT: 20 U/L (ref 0–44)
AST: 27 U/L (ref 15–41)
Albumin: 3.9 g/dL (ref 3.5–5.0)
Alkaline Phosphatase: 60 U/L (ref 38–126)
Bilirubin, Direct: <0.1 mg/dL (ref 0.0–0.2)
Total Bilirubin: 0.6 mg/dL (ref 0.3–1.2)
Total Protein: 7.2 g/dL (ref 6.5–8.1)

## 2020-11-18 LAB — BASIC METABOLIC PANEL
Anion gap: 8 (ref 5–15)
BUN: 15 mg/dL (ref 8–23)
CO2: 24 mmol/L (ref 22–32)
Calcium: 9.2 mg/dL (ref 8.9–10.3)
Chloride: 104 mmol/L (ref 98–111)
Creatinine, Ser: 0.74 mg/dL (ref 0.44–1.00)
GFR, Estimated: >60 mL/min (ref >60)
Glucose, Bld: 86 mg/dL (ref 70–99)
Potassium: 4.5 mmol/L (ref 3.5–5.1)
Sodium: 136 mmol/L (ref 135–145)

## 2020-11-18 LAB — TSH: TSH: 4.546 u[IU]/mL — ABNORMAL HIGH (ref 0.350–4.500)

## 2020-11-18 LAB — T4, FREE: Free T4: 0.78 ng/dL (ref 0.61–1.12)

## 2020-11-18 MED ORDER — LISINOPRIL 20 MG PO TABS: 20.0000 mg | ORAL_TABLET | Freq: Two times a day (BID) | ORAL | 1 refills | Status: DC

## 2020-11-18 NOTE — Progress Notes (Signed)
Primary Care Physician: [name], DO Referring Physician: [facility] ER f/u Cardiologist: Dr. Eden Emms EP: Dr. [name] is a 86 y.o. female with a h/o paroxysmal afib in the afib clinic for f/u  tikosyn use. Reports no afib. She feels well. She is not on anticoagulation per her choice as she had repeat nose bleeds despite having cautery x 2. She was told the risks of stroke being off anticoagulation and she was clear she did not want to be on blood thinners any longer.  F/u afib clinic, 4/22 for an ER visit last week for afib with RVR. She spontaneously converted with a cardizem drip. Her K+ was low at 3.3, pt had stopped taking and was placed back on K +. She remains off anticoagulation 2/2 issues with nosebleeds a while back and clear in her decision not to take anticoagulation. She is in SR today.   F/u in afib clinic, 09/30/20. Pt reports that she had an episode of afib in May and IV diltiazem was given to which pt converted quickly. She was d/c home. She  still refuses anticoagulation. She also reports that she  had 2 episodes in September that she treated at home because the IV Cardizem left her feeling so weak. She is in SR today.   Follow up in the AF clinic 11/01/20. Patient was seen at the ED 10/25/20 for a fluttering sensation in her chest. She ws in afib with RVR, converted with bolus of diltiazem. Her PRN diltiazem was changed to metoprolol. She denies any further episodes of afib.   Follow up in the AF clinic 11/18/20. Since her last visit, she has had two additional episodes of afib on 11/24 and 12/5. She called EMS on 11/24 and rhythm strips confirmed afib with RVR (personally reviewed). She opted not to go to the ED at that time. The episode on 12/5 lasted about 18 hours. There were no triggers she could identify.   Today, she denies symptoms of chest pain, shortness of breath, orthopnea, PND, lower extremity edema, dizziness, presyncope, syncope, or neurologic  sequela. The patient is tolerating medications without difficulties and is otherwise without complaint today.   Past Medical History:  Diagnosis Date  . Atrial fibrillation (HCC)    mostly in SR on Tikosyn  . Cataract   . HTN (hypertension)   . Rosacea    Past Surgical History:  Procedure Laterality Date  . VARICOSE VEIN SURGERY      Current Outpatient Medications  Medication Sig Dispense Refill  . Cholecalciferol (VITAMIN D-3) 25 MCG (1000 UT) CAPS Take 1,000 Units by mouth daily.     Marland Kitchen dofetilide (TIKOSYN) 250 MCG capsule Take 1 capsule (250 mcg total) by mouth 2 (two) times daily. 60 capsule 6  . lisinopril (ZESTRIL) 20 MG tablet Take 1 tablet (20 mg total) by mouth in the morning and at bedtime. 90 tablet 3  . MAGNESIUM PO Take 1 tablet by mouth daily as needed ("for low magnesium").    . metoprolol tartrate (LOPRESSOR) 25 MG tablet Take 1 tablet (25 mg total) by mouth as needed (for fast heart rates / breakthrough atrial fibrillation. May take a second tab 30 minutes later if initial dose does not improve heart rate). 30 tablet 6  . Polyethyl Glycol-Propyl Glycol (SYSTANE HYDRATION PF OP) Place 1 drop into both eyes 3 (three) times daily as needed (for dryness).    . potassium chloride SA (KLOR-CON M20) 20 MEQ tablet Take 0.5 tablets (10  mEq total) by mouth 2 (two) times daily. 90 tablet 2  . VALERIAN ROOT PO Take 1 capsule by mouth daily as needed (to help improve sleep, promote relaxation, and/or reduce anxiety).    . vitamin B-12 (CYANOCOBALAMIN) 100 MCG tablet Take 100 mcg by mouth daily.     No current facility-administered medications for this encounter.    Allergies  Allergen Reactions  . Other Itching and Other (See Comments)    Dust and seasonal allergies- itchy eyes, runny nose, etc..    Social History   Socioeconomic History  . Marital status: Widowed    Spouse name: Not on file  . Number of children: Not on file  . Years of education: Not on file  . Highest  education level: Not on file  Occupational History  . Not on file  Tobacco Use  . Smoking status: Never Smoker  . Smokeless tobacco: Never Used  Vaping Use  . Vaping Use: Never used  Substance and Sexual Activity  . Alcohol use: No  . Drug use: No  . Sexual activity: Not Currently  Other Topics Concern  . Not on file  Social History Narrative  . Not on file   Social Determinants of Health   Financial Resource Strain: Not on file  Food Insecurity: Not on file  Transportation Needs: Not on file  Physical Activity: Not on file  Stress: Not on file  Social Connections: Not on file  Intimate Partner Violence: Not on file    Family History  Problem Relation Age of Onset  . Heart Problems Mother     ROS- All systems are reviewed and negative except as per the HPI above  Physical Exam: Vitals:   11/18/20 1416  BP: (!) 146/70   Wt Readings from Last 3 Encounters:  11/01/20 55 kg  10/25/20 58.5 kg  09/30/20 54.3 kg    Labs: Lab Results  Component Value Date   NA 138 10/25/2020   K 3.8 10/25/2020   CL 107 10/25/2020   CO2 23 10/25/2020   GLUCOSE 120 (H) 10/25/2020   BUN 12 10/25/2020   CREATININE 0.72 10/25/2020   CALCIUM 9.2 10/25/2020   MG 2.1 10/25/2020   Lab Results  Component Value Date   INR 1.1 05/05/2020   Lab Results  Component Value Date   CHOL  07/02/2008    130        ATP III CLASSIFICATION:  <200     mg/dL   Desirable  941-740  mg/dL   Borderline High  >=814    mg/dL   High   HDL 42 48/18/5631   LDLCALC  07/02/2008    71        Total Cholesterol/HDL:CHD Risk Coronary Heart Disease Risk Table                     Men   Women  1/2 Average Risk   3.4   3.3   TRIG 87 07/02/2008    GEN- The patient is well appearing elderly female, alert and oriented x 3 today.   HEENT-head normocephalic, atraumatic, sclera clear, conjunctiva pink, hearing intact, trachea midline. Lungs- Clear to ausculation bilaterally, normal work of breathing Heart-  Regular rate and rhythm, no murmurs, rubs or gallops  GI- soft, NT, ND, + BS Extremities- no clubbing, cyanosis, or edema MS- no significant deformity or atrophy Skin- no rash or lesion Psych- euthymic mood, full affect Neuro- strength and sensation are intact   EKG- not  ordered today   Assessment and Plan: 1. Paroxysmal Afib  Patient having more frequent episodes of symptomatic afib.  We discussed therapeutic options today including switching to amiodarone. Her TSH was elevated at the ED visit on 11/15. Will recheck this before considering changing. ? If this is making her afib more persistent.  Continue Tikosyn 250 mcg for now. Continue Lopressor 25 mg PRN for heart racing. Chadsvasc score of at least 4, has had 2 significant nosebleeds that ENT has not been able to identify a source. Patient continues to decline anticoagulation.   2. LV dysfunction EF returned to normal with SR No signs or symptoms of fluid overload.  3. HTN Better today with increased lisinopril. Bmet today.   Follow up in the AF clinic next week.    Jorja Loa PA-C Afib Clinic [facility] [address] [place], [place] [address] [phone]

## 2020-11-24 ENCOUNTER — Ambulatory Visit (HOSPITAL_COMMUNITY)
Admission: RE | Admit: 2020-11-24 | Discharge: 2020-11-24 | Disposition: A | Discharge status: Home / Self Care | Payer: Medicare HMO | Source: Ambulatory Visit | Attending: Nurse Practitioner | Admitting: Nurse Practitioner

## 2020-11-24 ENCOUNTER — Encounter (HOSPITAL_COMMUNITY): Payer: Self-pay | Admitting: Nurse Practitioner

## 2020-11-24 ENCOUNTER — Other Ambulatory Visit: Payer: Self-pay

## 2020-11-24 VITALS — BP 190/80 | HR 62 | Ht 66.0 in | Wt 121.2 lb

## 2020-11-24 DIAGNOSIS — Z79899 Other long term (current) drug therapy: Secondary | ICD-10-CM | POA: Insufficient documentation

## 2020-11-24 DIAGNOSIS — I480 Paroxysmal atrial fibrillation: Secondary | ICD-10-CM | POA: Diagnosis not present

## 2020-11-24 DIAGNOSIS — Z5329 Procedure and treatment not carried out because of patient's decision for other reasons: Secondary | ICD-10-CM | POA: Insufficient documentation

## 2020-11-24 DIAGNOSIS — I10 Essential (primary) hypertension: Secondary | ICD-10-CM | POA: Diagnosis not present

## 2020-11-24 DIAGNOSIS — D6869 Other thrombophilia: Secondary | ICD-10-CM

## 2020-11-24 MED ORDER — DILTIAZEM HCL 30 MG PO TABS: ORAL_TABLET | ORAL | 1 refills | Status: DC

## 2020-11-24 NOTE — Progress Notes (Signed)
Primary Care Physician: [name], DO Referring Physician: [facility] ER f/u Cardiologist: Dr. Eden Emms EP: Dr. [name] is a 86 y.o. female with a h/o paroxysmal afib in the afib clinic for f/u  tikosyn use. Reports no afib. She feels well. She is not on anticoagulation per her choice as she had repeat nose bleeds despite having cautery x 2. She was told the risks of stroke being off anticoagulation and she was clear she did not want to be on blood thinners any longer.  F/u afib clinic, 4/22 for an ER visit last week for afib with RVR. She spontaneously converted with a cardizem drip. Her K+ was low at 3.3, pt had stopped taking and was placed back on K +. She remains off anticoagulation 2/2 issues with nosebleeds a while back and clear in her decision not to take anticoagulation. She is in SR today.   F/u in afib clinic, 09/30/20. Pt reports that she had an episode of afib in May and IV diltiazem was given to which pt converted quickly. She was d/c home. She  still refuses anticoagulation. She also reports that she  had 2 episodes in September that she treated at home because the IV Cardizem left her feeling so weak. She is in SR today.   Pt  in back in the afib clinic, 11/24/20,  for increase in afib burden and issues with elevated BP. Her lisinopril was recently increased and she had been doing well with BP and no afib this week until one hour before this visit, her BP became elevated and was 190/80 on presentation. Rechecked during visit it lowered to 148/70. She had been having more afib but seems to have settled down. I discussed with her if afib excalates, may have to switch to amiodarone form tikosyn.  Today, she denies symptoms of palpitations, chest pain, shortness of breath, orthopnea, PND, lower extremity edema, dizziness, presyncope, syncope, or neurologic sequela. The patient is tolerating medications without difficulties and is otherwise without complaint today.    Past Medical History:  Diagnosis Date  . Atrial fibrillation (HCC)    mostly in SR on Tikosyn  . Cataract   . HTN (hypertension)   . Rosacea    Past Surgical History:  Procedure Laterality Date  . VARICOSE VEIN SURGERY      Current Outpatient Medications  Medication Sig Dispense Refill  . Cholecalciferol (VITAMIN D-3) 25 MCG (1000 UT) CAPS Take 1,000 Units by mouth daily.     Marland Kitchen dofetilide (TIKOSYN) 250 MCG capsule Take 1 capsule (250 mcg total) by mouth 2 (two) times daily. 60 capsule 6  . lisinopril (ZESTRIL) 20 MG tablet Take 1 tablet (20 mg total) by mouth in the morning and at bedtime. 180 tablet 1  . MAGNESIUM PO Take 1 tablet by mouth daily as needed ("for low magnesium").    . mupirocin ointment (BACTROBAN) 2 % 1 application    . Polyethyl Glycol-Propyl Glycol (SYSTANE HYDRATION PF OP) Place 1 drop into both eyes 3 (three) times daily as needed (for dryness).    . potassium chloride SA (KLOR-CON M20) 20 MEQ tablet Take 0.5 tablets (10 mEq total) by mouth 2 (two) times daily. 90 tablet 2  . vitamin B-12 (CYANOCOBALAMIN) 100 MCG tablet Take 100 mcg by mouth daily.    Marland Kitchen diltiazem (CARDIZEM) 30 MG tablet Take 1/2-1 tablet every 4 hours AS NEEDED for heart rate >100 45 tablet 1  . VALERIAN ROOT PO Take 1 capsule by  mouth daily as needed (to help improve sleep, promote relaxation, and/or reduce anxiety). (Patient not taking: Reported on 11/24/2020)     No current facility-administered medications for this encounter.    Allergies  Allergen Reactions  . Other Itching and Other (See Comments)    Dust and seasonal allergies- itchy eyes, runny nose, etc..    Social History   Socioeconomic History  . Marital status: Widowed    Spouse name: Not on file  . Number of children: Not on file  . Years of education: Not on file  . Highest education level: Not on file  Occupational History  . Not on file  Tobacco Use  . Smoking status: Never Smoker  . Smokeless tobacco: Never  Used  Vaping Use  . Vaping Use: Never used  Substance and Sexual Activity  . Alcohol use: No  . Drug use: No  . Sexual activity: Not Currently  Other Topics Concern  . Not on file  Social History Narrative  . Not on file   Social Determinants of Health   Financial Resource Strain: Not on file  Food Insecurity: Not on file  Transportation Needs: Not on file  Physical Activity: Not on file  Stress: Not on file  Social Connections: Not on file  Intimate Partner Violence: Not on file    Family History  Problem Relation Age of Onset  . Heart Problems Mother     ROS- All systems are reviewed and negative except as per the HPI above  Physical Exam: Vitals:   11/24/20 1327  BP: (!) 190/80  Pulse: 62  Weight: 55 kg  Height: 5\' 6"  (1.676 m)   Wt Readings from Last 3 Encounters:  11/24/20 55 kg  11/01/20 55 kg  10/25/20 58.5 kg    Labs: Lab Results  Component Value Date   NA 136 11/18/2020   K 4.5 11/18/2020   CL 104 11/18/2020   CO2 24 11/18/2020   GLUCOSE 86 11/18/2020   BUN 15 11/18/2020   CREATININE 0.74 11/18/2020   CALCIUM 9.2 11/18/2020   MG 2.1 10/25/2020   Lab Results  Component Value Date   INR 1.1 05/05/2020   Lab Results  Component Value Date   CHOL  07/02/2008    130        ATP III CLASSIFICATION:  <200     mg/dL   Desirable  07/04/2008  mg/dL   Borderline High  836-629    mg/dL   High   HDL 42 >=476   LDLCALC  07/02/2008    71        Total Cholesterol/HDL:CHD Risk Coronary Heart Disease Risk Table                     Men   Women  1/2 Average Risk   3.4   3.3   TRIG 87 07/02/2008     GEN- The patient is well appearing, alert and oriented x 3 today.   Head- normocephalic, atraumatic Eyes-  Sclera clear, conjunctiva pink Ears- hearing intact Oropharynx- clear Neck- supple, no JVP Lymph- no cervical lymphadenopathy Lungs- Clear to ausculation bilaterally, normal work of breathing Heart- Regular rate and rhythm, no murmurs, rubs or  gallops, PMI not laterally displaced GI- soft, NT, ND, + BS Extremities- no clubbing, cyanosis, or edema MS- no significant deformity or atrophy Skin- no rash or lesion Psych- euthymic mood, full affect Neuro- strength and sensation are intact  EKG-NSR, with pac's,  at 62  bpm,  pr int 184 ms, qrs int 120  ms, qtc 482 ms    Assessment and Plan: 1. Afib  Staying in SR, recent increase in  Breakthrough If continues consider stopping Tikosyn and using amiodarone  Continue Tikosyn 250 mcg and Cardizem 30 mg prn Chadsvasc score of at least 4, has had 2 significant nosebleeds that ENT has not been able to identify a source, had even with ASA dn refuses to go back on anticoagulation  Pt is clear in her decision, knowing stroke risk, that she will no longer take anticoagulation    2. HTN Better regulated with recent increase in lisinopril  Discussed with pt that she could try 1/2-1 tab of cardizem as well if BP is sustained over 160 systolic for more than 1-2 hours   F/u in afib clinic in 3 weeks    Lupita Leash C. Matthew Folks Afib [facility] [address] [place], [place] [address] [phone]

## 2020-11-24 NOTE — Patient Instructions (Signed)
If you should go into afib and your heart rate is staying over 100 -- can use cardizem (diltiazem) tablet every 4 hours as needed.   If you should have a increase in blood pressure (top number staying above 160) can use cardizem (diltiazem) 1/2 tablet every 4 hours as needed

## 2020-12-23 ENCOUNTER — Encounter (HOSPITAL_COMMUNITY): Payer: Self-pay | Admitting: Nurse Practitioner

## 2020-12-23 ENCOUNTER — Ambulatory Visit (HOSPITAL_COMMUNITY)
Admission: RE | Admit: 2020-12-23 | Discharge: 2020-12-23 | Disposition: A | Discharge status: Home / Self Care | Payer: Medicare HMO | Source: Ambulatory Visit | Attending: Nurse Practitioner | Admitting: Nurse Practitioner

## 2020-12-23 ENCOUNTER — Other Ambulatory Visit: Payer: Self-pay

## 2020-12-23 VITALS — BP 176/72 | HR 66 | Ht 66.0 in | Wt 123.0 lb

## 2020-12-23 DIAGNOSIS — I480 Paroxysmal atrial fibrillation: Secondary | ICD-10-CM

## 2020-12-23 DIAGNOSIS — Z8249 Family history of ischemic heart disease and other diseases of the circulatory system: Secondary | ICD-10-CM | POA: Insufficient documentation

## 2020-12-23 DIAGNOSIS — I10 Essential (primary) hypertension: Secondary | ICD-10-CM | POA: Diagnosis not present

## 2020-12-23 DIAGNOSIS — Z79899 Other long term (current) drug therapy: Secondary | ICD-10-CM | POA: Insufficient documentation

## 2020-12-23 NOTE — Progress Notes (Signed)
Primary Care Physician: Amanda Reichmann, DO Referring Physician: [facility] ER f/u Cardiologist: Dr. Eden Olsen [name] is a 87 y.o. female with a h/o paroxysmal afib in the afib clinic for f/u  tikosyn use. Reports no afib. She feels well. She is not on anticoagulation per her choice as she had repeat nose bleeds despite having cautery x 2. She was told the risks of stroke being off anticoagulation and she was clear she did not want to be on blood thinners any longer.  F/u afib clinic, 4/22 for an ER visit last week for afib with RVR. She spontaneously converted with a cardizem drip. Her K+ was low at 3.3, pt had stopped taking and was placed back on K +. She remains off anticoagulation 2/2 issues with nosebleeds a while back and clear in her decision not to take anticoagulation. She is in SR today.   F/u in afib clinic, 09/30/20. Pt reports that she had an episode of afib in May and IV diltiazem was given to which pt converted quickly. She was d/c home. She  still refuses anticoagulation. She also reports that she  had 2 episodes in September that she treated at home because the IV Cardizem left her feeling so weak. She is in SR today.   Pt  in back in the afib clinic, 11/24/20,  for increase in afib burden and issues with elevated BP. Her lisinopril was recently increased and she had been doing well with BP and no afib this week until one hour before this visit, her BP became elevated and was 190/80 on presentation. Rechecked during visit it lowered to 148/70. She had been having more afib but seems to have settled down. I discussed with her if afib excalates, may have to switch to amiodarone form tikosyn.   F/u in afib clinic,12/23/20. She states that she has been good at home for the last several weeks. No afib and BP at home is stable. Elevated on presentation today. Mild cough since increase of lisinopril. But states that she doe not feel a need for change in medication .    Today, she denies symptoms of palpitations, chest pain, shortness of breath, orthopnea, PND, lower extremity edema, dizziness, presyncope, syncope, or neurologic sequela. The patient is tolerating medications without difficulties and is otherwise without complaint today.   Past Medical History:  Diagnosis Date  . Atrial fibrillation (HCC)    mostly in SR on Tikosyn  . Cataract   . HTN (hypertension)   . Rosacea    Past Surgical History:  Procedure Laterality Date  . VARICOSE VEIN SURGERY      Current Outpatient Medications  Medication Sig Dispense Refill  . Cholecalciferol (VITAMIN D-3) 25 MCG (1000 UT) CAPS Take 1,000 Units by mouth daily.     Marland Kitchen diltiazem (CARDIZEM) 30 MG tablet Take 1/2-1 tablet every 4 hours AS NEEDED for heart rate >100 45 tablet 1  . dofetilide (TIKOSYN) 250 MCG capsule Take 1 capsule (250 mcg total) by mouth 2 (two) times daily. 60 capsule 6  . lisinopril (ZESTRIL) 20 MG tablet Take 1 tablet (20 mg total) by mouth in the morning and at bedtime. 180 tablet 1  . MAGNESIUM PO Take 1 tablet by mouth daily as needed ("for low magnesium").    . mupirocin ointment (BACTROBAN) 2 % 1 application    . Polyethyl Glycol-Propyl Glycol (SYSTANE HYDRATION PF OP) Place 1 drop into both eyes 3 (three) times daily as needed (for  dryness).    . potassium chloride SA (KLOR-CON M20) 20 MEQ tablet Take 0.5 tablets (10 mEq total) by mouth 2 (two) times daily. (Patient taking differently: Take 20 mEq by mouth daily in the afternoon.) 90 tablet 2  . VALERIAN ROOT PO Take 1 capsule by mouth daily as needed (to help improve sleep, promote relaxation, and/or reduce anxiety).    . vitamin B-12 (CYANOCOBALAMIN) 100 MCG tablet Take 100 mcg by mouth daily.     No current facility-administered medications for this encounter.    Allergies  Allergen Reactions  . Other Itching and Other (See Comments)    Dust and seasonal allergies- itchy eyes, runny nose, etc..    Social History    Socioeconomic History  . Marital status: Widowed    Spouse name: Not on file  . Number of children: Not on file  . Years of education: Not on file  . Highest education level: Not on file  Occupational History  . Not on file  Tobacco Use  . Smoking status: Never Smoker  . Smokeless tobacco: Never Used  Vaping Use  . Vaping Use: Never used  Substance and Sexual Activity  . Alcohol use: No  . Drug use: No  . Sexual activity: Not Currently  Other Topics Concern  . Not on file  Social History Narrative  . Not on file   Social Determinants of Health   Financial Resource Strain: Not on file  Food Insecurity: Not on file  Transportation Needs: Not on file  Physical Activity: Not on file  Stress: Not on file  Social Connections: Not on file  Intimate Partner Violence: Not on file    Family History  Problem Relation Age of Onset  . Heart Problems Mother     ROS- All systems are reviewed and negative except as per the HPI above  Physical Exam: Vitals:   12/23/20 1424  BP: (!) 176/72  Pulse: 66  Weight: 55.8 kg  Height: 5\' 6"  (1.676 m)   Wt Readings from Last 3 Encounters:  12/23/20 55.8 kg  11/24/20 55 kg  11/01/20 55 kg    Labs: Lab Results  Component Value Date   NA 136 11/18/2020   K 4.5 11/18/2020   CL 104 11/18/2020   CO2 24 11/18/2020   GLUCOSE 86 11/18/2020   BUN 15 11/18/2020   CREATININE 0.74 11/18/2020   CALCIUM 9.2 11/18/2020   MG 2.1 10/25/2020   Lab Results  Component Value Date   INR 1.1 05/05/2020   Lab Results  Component Value Date   CHOL  07/02/2008    130        ATP III CLASSIFICATION:  <200     mg/dL   Desirable  07/04/2008  mg/dL   Borderline High  400-867    mg/dL   High   HDL 42 >=619   LDLCALC  07/02/2008    71        Total Cholesterol/HDL:CHD Risk Coronary Heart Disease Risk Table                     Men   Women  1/2 Average Risk   3.4   3.3   TRIG 87 07/02/2008     GEN- The patient is well appearing, alert and  oriented x 3 today.   Head- normocephalic, atraumatic Eyes-  Sclera clear, conjunctiva pink Ears- hearing intact Oropharynx- clear Neck- supple, no JVP Lymph- no cervical lymphadenopathy Lungs- Clear to ausculation bilaterally, normal work  of breathing Heart- Regular rate and rhythm, no murmurs, rubs or gallops, PMI not laterally displaced GI- soft, NT, ND, + BS Extremities- no clubbing, cyanosis, or edema MS- no significant deformity or atrophy Skin- no rash or lesion Psych- euthymic mood, full affect Neuro- strength and sensation are intact  EKG-NSR, with pac's, LBBB  at 66  bpm, pr int 180 ms, qrs int 124  ms, qtc 473 ms    Assessment and Plan: 1. Afib  Staying in SR,  Quiet over the last several weeeks  Continue Tikosyn 250 mcg and Cardizem 30 mg prn Chadsvasc score of at least 4, has had 2 significant nosebleeds that ENT has not been able to identify a source, had even with ASA and refuses to go back on anticoagulation  Pt is clear in her decision, knowing stroke risk, that she will no longer take anticoagulation    2. HTN Elevated on presentation here but controlled at home  Discussed with pt that she could try 1/2-1 tab of cardizem as well if BP is sustained over 160 systolic for more than 1-2 hours   F/u in afib clinic in 2 months    Lupita Leash [facility] [address] [place], [place] [address] [phone]

## 2021-01-09 ENCOUNTER — Emergency Department (HOSPITAL_COMMUNITY)
Admission: EM | Admit: 2021-01-09 | Discharge: 2021-01-09 | Disposition: A | Discharge status: Home / Self Care | Payer: Medicare HMO | Attending: Emergency Medicine | Admitting: Emergency Medicine

## 2021-01-09 ENCOUNTER — Emergency Department (HOSPITAL_COMMUNITY): Payer: Medicare HMO

## 2021-01-09 ENCOUNTER — Encounter (HOSPITAL_COMMUNITY): Payer: Self-pay | Admitting: Emergency Medicine

## 2021-01-09 ENCOUNTER — Other Ambulatory Visit: Payer: Self-pay

## 2021-01-09 DIAGNOSIS — R0989 Other specified symptoms and signs involving the circulatory and respiratory systems: Secondary | ICD-10-CM | POA: Diagnosis not present

## 2021-01-09 DIAGNOSIS — E039 Hypothyroidism, unspecified: Secondary | ICD-10-CM | POA: Insufficient documentation

## 2021-01-09 DIAGNOSIS — R000 Tachycardia, unspecified: Secondary | ICD-10-CM | POA: Diagnosis not present

## 2021-01-09 DIAGNOSIS — Z79899 Other long term (current) drug therapy: Secondary | ICD-10-CM | POA: Diagnosis not present

## 2021-01-09 DIAGNOSIS — R002 Palpitations: Secondary | ICD-10-CM | POA: Diagnosis present

## 2021-01-09 DIAGNOSIS — I10 Essential (primary) hypertension: Secondary | ICD-10-CM | POA: Insufficient documentation

## 2021-01-09 DIAGNOSIS — R531 Weakness: Secondary | ICD-10-CM | POA: Diagnosis not present

## 2021-01-09 DIAGNOSIS — I4891 Unspecified atrial fibrillation: Secondary | ICD-10-CM | POA: Insufficient documentation

## 2021-01-09 LAB — BASIC METABOLIC PANEL
Anion gap: 11 (ref 5–15)
BUN: 13 mg/dL (ref 8–23)
CO2: 24 mmol/L (ref 22–32)
Calcium: 9.2 mg/dL (ref 8.9–10.3)
Chloride: 105 mmol/L (ref 98–111)
Creatinine, Ser: 0.75 mg/dL (ref 0.44–1.00)
GFR, Estimated: >60 mL/min (ref >60)
Glucose, Bld: 131 mg/dL — ABNORMAL HIGH (ref 70–99)
Potassium: 4.1 mmol/L (ref 3.5–5.1)
Sodium: 140 mmol/L (ref 135–145)

## 2021-01-09 LAB — CBC
HCT: 42.1 % (ref 36.0–46.0)
Hemoglobin: 13.8 g/dL (ref 12.0–15.0)
MCH: 29.9 pg (ref 26.0–34.0)
MCHC: 32.8 g/dL (ref 30.0–36.0)
MCV: 91.1 fL (ref 80.0–100.0)
Platelets: 184 10*3/uL (ref 150–400)
RBC: 4.62 MIL/uL (ref 3.87–5.11)
RDW: 13.2 % (ref 11.5–15.5)
WBC: 4.8 10*3/uL (ref 4.0–10.5)
nRBC: 0 % (ref 0.0–0.2)

## 2021-01-09 LAB — TROPONIN I (HIGH SENSITIVITY)
Troponin I (High Sensitivity): 16 ng/L (ref <18)
Troponin I (High Sensitivity): 12 ng/L (ref <18)

## 2021-01-09 LAB — MAGNESIUM: Magnesium: 1.9 mg/dL (ref 1.7–2.4)

## 2021-01-09 MED ORDER — DILTIAZEM HCL 30 MG PO TABS
30.0000 mg | ORAL_TABLET | Freq: Once | ORAL | Status: AC
  Administered 2021-01-09: 30 mg via ORAL
  Filled 2021-01-09: qty 1

## 2021-01-09 MED ORDER — ASPIRIN 81 MG PO CHEW
81.0000 mg | CHEWABLE_TABLET | Freq: Once | ORAL | Status: AC
  Administered 2021-01-09: 81 mg via ORAL
  Filled 2021-01-09: qty 1

## 2021-01-09 MED ORDER — DILTIAZEM LOAD VIA INFUSION
10.0000 mg | Freq: Once | INTRAVENOUS | Status: DC
  Filled 2021-01-09: qty 10

## 2021-01-09 MED ORDER — DILTIAZEM HCL-DEXTROSE 125-5 MG/125ML-% IV SOLN (PREMIX)
5.0000 mg/h | INTRAVENOUS | Status: DC
  Filled 2021-01-09: qty 125

## 2021-01-09 NOTE — ED Provider Notes (Signed)
[facility] Provider Note   CSN: [id number] Arrival date & time: 01/09/21  1134     History Chief Complaint  Patient presents with  . Atrial Fibrillation    Amanda Olsen is a 87 y.o. female.  HPI    87 year old female comes in a chief complaint of A. fib.  She has history of paroxysmal A. fib on Tikosyn and diltiazem.  The diltiazem was taken as needed.  She is not on any anticoagulation given previous history of heavy nosebleeds.  Patient is not taking any antiplatelet agent either.  Patient reports that over the last 10 days this is her third episode of palpitations.  The previous to episode lasted for at least 10 hours.  Those episodes converted spontaneously.  Today she decided to come to the ER.  She did take her as needed diltiazem prior to ER arrival.  Patient is having palpitations and feeling sick to her stomach.  She has no chest pain, dizziness, shortness of breath.  She has been taking her medications as prescribed.  No sick contacts.  Past Medical History:  Diagnosis Date  . Atrial fibrillation (HCC)    mostly in SR on Tikosyn  . Cataract   . HTN (hypertension)   . Rosacea     Patient Active Problem List   Diagnosis Date Noted  . Secondary hypercoagulable state (HCC) 11/01/2020  . Atrial fibrillation with rapid ventricular response (HCC) 05/05/2020  . Visit for monitoring Tikosyn therapy 07/09/2017  . Hypothyroid 11/16/2011  . ELECTROCARDIOGRAM, ABNORMAL 05/02/2010  . ESSENTIAL HYPERTENSION, BENIGN 04/15/2009  . ROSACEA 04/15/2009  . Paroxysmal atrial fibrillation (HCC) 10/16/2008    Past Surgical History:  Procedure Laterality Date  . VARICOSE VEIN SURGERY       OB History   No obstetric history on file.     Family History  Problem Relation Age of Onset  . Heart Problems Mother     Social History   Tobacco Use  . Smoking status: Never Smoker  . Smokeless tobacco: Never Used  Vaping Use  . Vaping  Use: Never used  Substance Use Topics  . Alcohol use: No  . Drug use: No    Home Medications Prior to Admission medications   Medication Sig Start Date End Date Taking? Authorizing Provider  Cholecalciferol (VITAMIN D-3) 25 MCG (1000 UT) CAPS Take 1,000 Units by mouth daily.     [provider]  diltiazem (CARDIZEM) 30 MG tablet Take 1/2-1 tablet every 4 hours AS NEEDED for heart rate >100 11/24/20   Newman Nip, NP  dofetilide (TIKOSYN) 250 MCG capsule Take 1 capsule (250 mcg total) by mouth 2 (two) times daily. 07/08/20   Newman Nip, NP  lisinopril (ZESTRIL) 20 MG tablet Take 1 tablet (20 mg total) by mouth in the morning and at bedtime. 11/18/20 02/16/21  Newman Nip, NP  MAGNESIUM PO Take 1 tablet by mouth daily as needed ("for low magnesium").    [provider]  mupirocin ointment (BACTROBAN) 2 % 1 application 12/24/18   [provider]  Polyethyl Glycol-Propyl Glycol (SYSTANE HYDRATION PF OP) Place 1 drop into both eyes 3 (three) times daily as needed (for dryness).    [provider]  potassium chloride SA (KLOR-CON M20) 20 MEQ tablet Take 0.5 tablets (10 mEq total) by mouth 2 (two) times daily. Patient taking differently: Take 20 mEq by mouth daily in the afternoon. 09/21/20   Newman Nip, NP  Gerarda Fraction  ROOT PO Take 1 capsule by mouth daily as needed (to help improve sleep, promote relaxation, and/or reduce anxiety).    [provider]  vitamin B-12 (CYANOCOBALAMIN) 100 MCG tablet Take 100 mcg by mouth daily.    [provider]    Allergies    Other  Review of Systems   Review of Systems  Constitutional: Positive for activity change.  Respiratory: Negative for shortness of breath.   Cardiovascular: Positive for palpitations.  Neurological: Negative for dizziness.  Hematological: Does not bruise/bleed easily.  All other systems reviewed and are negative.   Physical Exam Updated Vital Signs BP 121/82    Pulse (!) 59   Temp 98.4 F (36.9 C) (Oral)   Resp 15   SpO2 100%   Physical Exam Vitals and nursing note reviewed.  Constitutional:      Appearance: She is well-developed.  HENT:     Head: Normocephalic and atraumatic.  Eyes:     Extraocular Movements: EOM normal.  Cardiovascular:     Rate and Rhythm: Tachycardia present. Rhythm irregular.  Pulmonary:     Effort: Pulmonary effort is normal.  Abdominal:     General: Bowel sounds are normal.  Musculoskeletal:     Cervical back: Normal range of motion and neck supple.  Skin:    General: Skin is warm and dry.  Neurological:     Mental Status: She is alert and oriented to person, place, and time.     ED Results / Procedures / Treatments   Labs (all labs ordered are listed, but only abnormal results are displayed) Labs Reviewed  BASIC METABOLIC PANEL - Abnormal; Notable for the following components:      Result Value   Glucose, Bld 131 (*)    All other components within normal limits  SARS CORONAVIRUS 2 (TAT 6-24 HRS)  CBC  MAGNESIUM  TROPONIN I (HIGH SENSITIVITY)  TROPONIN I (HIGH SENSITIVITY)    EKG EKG Interpretation  Date/Time:  Sunday January 09 2021 12:32:35 EST Ventricular Rate:  137 PR Interval:    QRS Duration: 114 QT Interval:  340 QTC Calculation: 513 R Axis:   68 Text Interpretation: Atrial fibrillation with rapid ventricular response Incomplete left bundle branch block Minimal voltage criteria for LVH, may be normal variant ( Cornell product ) ST & T wave abnormality, consider inferolateral ischemia Abnormal ECG Af with RVR is new Confirmed by Derwood Kaplan [phone]) on 01/09/2021 1:31:00 PM   EKG Interpretation  Date/Time:  Sunday January 09 2021 14:55:10 EST Ventricular Rate:  59 PR Interval:    QRS Duration: 94 QT Interval:  434 QTC Calculation: 430 R Axis:   19 Text Interpretation: Sinus rhythm Ventricular premature complex Minimal ST elevation, anterior leads No acute changes sinus restored  Confirmed by Derwood Kaplan [phone]) on 01/09/2021 3:12:38 PM        Radiology DG Chest 2 View  Result Date: 01/09/2021 CLINICAL DATA:  Atrial fibrillation.  Generalized weakness. EXAM: CHEST - 2 VIEW COMPARISON:  Chest x-ray dated 10/25/2020. FINDINGS: Heart size and mediastinal contours are stable. Lungs are clear. No pleural effusion. No acute appearing osseous abnormality. IMPRESSION: No active cardiopulmonary disease. No evidence of pneumonia or pulmonary edema. Electronically Signed   By: Bary Richard M.D.   On: 01/09/2021 13:36    Procedures .Critical Care Performed by: Derwood Kaplan, MD Authorized by: Derwood Kaplan, MD   Critical care provider statement:    Critical care time (minutes):  36   Critical care was necessary to  treat or prevent imminent or life-threatening deterioration of the following conditions:  Circulatory failure   Critical care was time spent personally by me on the following activities:  Discussions with consultants, evaluation of patient's response to treatment, examination of patient, ordering and performing treatments and interventions, ordering and review of laboratory studies, ordering and review of radiographic studies, pulse oximetry, re-evaluation of patient's condition, obtaining history from patient or surrogate and review of old charts     Medications Ordered in ED Medications  diltiazem (CARDIZEM) 1 mg/mL load via infusion 10 mg (0 mg Intravenous Hold 01/09/21 1404)    And  diltiazem (CARDIZEM) 125 mg in dextrose 5% 125 mL (1 mg/mL) infusion (0 mg/hr Intravenous Hold 01/09/21 1405)  diltiazem (CARDIZEM) tablet 30 mg (30 mg Oral Given 01/09/21 1502)  aspirin chewable tablet 81 mg (81 mg Oral Given 01/09/21 1455)    ED Course  I have reviewed the triage vital signs and the nursing notes.  Pertinent labs & imaging results that were available during my care of the patient were reviewed by me and considered in my medical decision making (see  chart for details).    MDM Rules/Calculators/A&P                          This patients CHA2DS2-VASc Score and unadjusted Ischemic Stroke Rate (% per year) is equal to 4.8 % stroke rate/year from a score of 4  Above score calculated as 1 point each if present [CHF, HTN, DM, Vascular=MI/PAD/Aortic Plaque, Age if 65-74, or Female] Above score calculated as 2 points each if present [Age > 75, or Stroke/TIA/TE]   88 year old female comes in with chief complaint of palpitations.  She is noted to be in A. fib with RVR.  History and exam are not suggestive of any reversible secondary cause.  She has been compliant with her medications.  It appears that over the last 10 days, this is her third episode of having A. fib with RVR.  Plan is to initiate a diltiazem drip.  She is not on any anticoagulation and not a candidate for cardioversion for her symptomatic A. Fib.  Reassess: Patient converted to normal sinus rhythm.  Oral diltiazem given.  Might not need admission to the [facility].  Reassessment: Patient now monitor for more than an hour since her spontaneous conversion.  She continues to be in sinus rhythm.  I have sent her information to A. fib clinic for a prompt follow-up.  She is comfortable going home.  Advised that she start taking baby aspirin for now.  Recommended further discussion with A. fib clinic on adding antiplatelet agent.  Final Clinical Impression(s) / ED Diagnoses Final diagnoses:  Atrial fibrillation with RVR [facility])    Rx / DC Orders ED Discharge Orders         Ordered    Amb referral to AFIB Clinic        01/09/21 1339           Derwood Kaplan, MD 01/09/21 1519

## 2021-01-09 NOTE — ED Triage Notes (Signed)
Pt to triage via [facility] from home.  Reports flutter in chest since 5am and generalized weakness.  Pt in AFIB.  EMS administered Cardizem 5mg  x 2 and started a gtt PTA.  States pt received approx 22mg  of Cardizem.  Pt denies pain.  18g LAC.

## 2021-01-09 NOTE — ED Notes (Signed)
Called radiology, pt is in X-ray at this time.

## 2021-01-09 NOTE — Discharge Instructions (Addendum)
You are seen in the ER for A. fib.  Your A. fib converted back to normal rhythm on its own.  Please follow-up with the A. fib clinic.  They should call you tomorrow for an appointment - but if they don't please call yourself.

## 2021-01-11 ENCOUNTER — Encounter (HOSPITAL_COMMUNITY): Payer: Self-pay | Admitting: Nurse Practitioner

## 2021-01-11 ENCOUNTER — Other Ambulatory Visit: Payer: Self-pay

## 2021-01-11 ENCOUNTER — Ambulatory Visit (HOSPITAL_COMMUNITY)
Admission: RE | Admit: 2021-01-11 | Discharge: 2021-01-11 | Disposition: A | Discharge status: Home / Self Care | Payer: Medicare HMO | Source: Ambulatory Visit | Attending: Nurse Practitioner | Admitting: Nurse Practitioner

## 2021-01-11 VITALS — BP 174/84 | HR 63 | Ht 66.0 in | Wt 121.4 lb

## 2021-01-11 DIAGNOSIS — I480 Paroxysmal atrial fibrillation: Secondary | ICD-10-CM | POA: Diagnosis not present

## 2021-01-11 DIAGNOSIS — I10 Essential (primary) hypertension: Secondary | ICD-10-CM | POA: Diagnosis not present

## 2021-01-11 DIAGNOSIS — D6869 Other thrombophilia: Secondary | ICD-10-CM | POA: Diagnosis not present

## 2021-01-11 MED ORDER — LISINOPRIL 20 MG PO TABS: 20.0000 mg | ORAL_TABLET | Freq: Every day | ORAL | 1 refills | Status: DC

## 2021-01-11 MED ORDER — ASPIRIN EC 81 MG PO TBEC
81.0000 mg | DELAYED_RELEASE_TABLET | Freq: Every day | ORAL | 2 refills | Status: DC
Start: 2021-01-11 — End: 2021-03-22

## 2021-01-11 MED ORDER — DILTIAZEM HCL ER COATED BEADS 120 MG PO CP24: 120.0000 mg | ORAL_CAPSULE | Freq: Every day | ORAL | 1 refills | Status: DC

## 2021-01-11 NOTE — Progress Notes (Signed)
Primary Care Physician: [name], DO Referring Physician: [facility] ER f/u Cardiologist: Dr. Eden Emms EP: Dr. [name] is a 87 y.o. female with a h/o paroxysmal afib in the afib clinic for f/u  tikosyn use. Reports no afib. She feels well. She is not on anticoagulation per her choice as she had repeat nose bleeds despite having cautery x 2. She was told the risks of stroke being off anticoagulation and she was clear she did not want to be on blood thinners any longer.  F/u afib clinic, 4/22 for an ER visit last week for afib with RVR. She spontaneously converted with a cardizem drip. Her K+ was low at 3.3, pt had stopped taking and was placed back on K +. She remains off anticoagulation 2/2 issues with nosebleeds a while back and clear in her decision not to take anticoagulation. She is in SR today.   F/u in afib clinic, 09/30/20. Pt reports that she had an episode of afib in May and IV diltiazem was given to which pt converted quickly. She was d/c home. She  still refuses anticoagulation. She also reports that she  had 2 episodes in September that she treated at home because the IV Cardizem left her feeling so weak. She is in SR today.   Pt  in back in the afib clinic, 11/24/20,  for increase in afib burden and issues with elevated BP. Her lisinopril was recently increased and she had been doing well with BP and no afib this week until one hour before this visit, her BP became elevated and was 190/80 on presentation. Rechecked during visit it lowered to 148/70. She had been having more afib but seems to have settled down. I discussed with her if afib excalates, may have to switch to amiodarone form tikosyn.   F/u in afib clinic,12/23/20. She states that she has been good at home for the last several weeks. No afib and BP at home is stable. Elevated on presentation today. Mild cough since increase of lisinopril. But states that she doe not feel a need for change in medication .    F/u in afib clinic for an ER visit with afib with RVR. She is fast in rhythm with a rate induced LBBB. She tries to handle at home but after 3rd episode in a day, she presented to ER. She broke shortly after that. The ER MD did discuss with her daily diltiazem with tikosyn. Daily meds were stopped a while back due to bradycardia. But can try this again as pt does not want to try amiodarone. She is willing to try to take ASA with h/o nose bleeds. Still prefers not to take DOAC.   Today, she denies symptoms of palpitations, chest pain, shortness of breath, orthopnea, PND, lower extremity edema, dizziness, presyncope, syncope, or neurologic sequela. The patient is tolerating medications without difficulties and is otherwise without complaint today.   Past Medical History:  Diagnosis Date  . Atrial fibrillation (HCC)    mostly in SR on Tikosyn  . Cataract   . HTN (hypertension)   . Rosacea    Past Surgical History:  Procedure Laterality Date  . VARICOSE VEIN SURGERY      Current Outpatient Medications  Medication Sig Dispense Refill  . dofetilide (TIKOSYN) 250 MCG capsule Take 1 capsule (250 mcg total) by mouth 2 (two) times daily. 60 capsule 6  . Cholecalciferol (VITAMIN D-3) 25 MCG (1000 UT) CAPS Take 1,000 Units by mouth daily.     Marland Kitchen  diltiazem (CARDIZEM) 30 MG tablet Take 1/2-1 tablet every 4 hours AS NEEDED for heart rate >100 45 tablet 1  . lisinopril (ZESTRIL) 20 MG tablet Take 1 tablet (20 mg total) by mouth in the morning and at bedtime. 180 tablet 1  . MAGNESIUM PO Take 1 tablet by mouth daily as needed ("for low magnesium").    . mupirocin ointment (BACTROBAN) 2 % 1 application    . Polyethyl Glycol-Propyl Glycol (SYSTANE HYDRATION PF OP) Place 1 drop into both eyes 3 (three) times daily as needed (for dryness).    . potassium chloride SA (KLOR-CON M20) 20 MEQ tablet Take 0.5 tablets (10 mEq total) by mouth 2 (two) times daily. (Patient taking differently: Take 20 mEq by mouth  daily in the afternoon.) 90 tablet 2  . VALERIAN ROOT PO Take 1 capsule by mouth daily as needed (to help improve sleep, promote relaxation, and/or reduce anxiety).    . vitamin B-12 (CYANOCOBALAMIN) 100 MCG tablet Take 100 mcg by mouth daily.     No current facility-administered medications for this encounter.    Allergies  Allergen Reactions  . Other Itching and Other (See Comments)    Dust and seasonal allergies- itchy eyes, runny nose, etc..    Social History   Socioeconomic History  . Marital status: Widowed    Spouse name: Not on file  . Number of children: Not on file  . Years of education: Not on file  . Highest education level: Not on file  Occupational History  . Not on file  Tobacco Use  . Smoking status: Never Smoker  . Smokeless tobacco: Never Used  Vaping Use  . Vaping Use: Never used  Substance and Sexual Activity  . Alcohol use: No  . Drug use: No  . Sexual activity: Not Currently  Other Topics Concern  . Not on file  Social History Narrative  . Not on file   Social Determinants of Health   Financial Resource Strain: Not on file  Food Insecurity: Not on file  Transportation Needs: Not on file  Physical Activity: Not on file  Stress: Not on file  Social Connections: Not on file  Intimate Partner Violence: Not on file    Family History  Problem Relation Age of Onset  . Heart Problems Mother     ROS- All systems are reviewed and negative except as per the HPI above  Physical Exam: Vitals:   01/11/21 1024  BP: (!) 174/84  Pulse: 63  Weight: 55.1 kg  Height: 5\' 6"  (1.676 m)   Wt Readings from Last 3 Encounters:  01/11/21 55.1 kg  12/23/20 55.8 kg  11/24/20 55 kg    Labs: Lab Results  Component Value Date   NA 140 01/09/2021   K 4.1 01/09/2021   CL 105 01/09/2021   CO2 24 01/09/2021   GLUCOSE 131 (H) 01/09/2021   BUN 13 01/09/2021   CREATININE 0.75 01/09/2021   CALCIUM 9.2 01/09/2021   MG 1.9 01/09/2021   Lab Results   Component Value Date   INR 1.1 05/05/2020   Lab Results  Component Value Date   CHOL  07/02/2008    130        ATP III CLASSIFICATION:  <200     mg/dL   Desirable  07/04/2008  mg/dL   Borderline High  194-174    mg/dL   High   HDL 42 >=081   LDLCALC  07/02/2008    71  Total Cholesterol/HDL:CHD Risk Coronary Heart Disease Risk Table                     Men   Women  1/2 Average Risk   3.4   3.3   TRIG 87 07/02/2008     GEN- The patient is well appearing, alert and oriented x 3 today.   Head- normocephalic, atraumatic Eyes-  Sclera clear, conjunctiva pink Ears- hearing intact Oropharynx- clear Neck- supple, no JVP Lymph- no cervical lymphadenopathy Lungs- Clear to ausculation bilaterally, normal work of breathing Heart- Regular rate and rhythm, no murmurs, rubs or gallops, PMI not laterally displaced GI- soft, NT, ND, + BS Extremities- no clubbing, cyanosis, or edema MS- no significant deformity or atrophy Skin- no rash or lesion Psych- euthymic mood, full affect Neuro- strength and sensation are intact  EKG-NSR, 63  bpm, pr int 170 ms, qrs int 84  ms, qtc 423 ms    Assessment and Plan: 1. Afib  Recent ER visit for afib, increase in burden over the last several months   Pt does not want to try amiodarone Will try adding in daily cardizem 120 mg qd, but will have to watch HR carefully as she had bradycardia in the past with daily rate control  Continue Tikosyn 250 mcg  Chadsvasc score of at least 4, has had 2 significant nosebleeds that ENT has not been able to identify a source, had even with ASA and refused to go back on anticoagulation  Pt is willing to try asa again  Pt is clear in her decision, knowing stroke risk, that she will no longer take DOAC    2. HTN Elevated on presentation here but controlled at home  Will  lower lisinopril to once a day with cardizem daily   F/u in afib clinic in one week    Lupita Leash C. [facility] [address] [place], [place] [address] [phone]

## 2021-01-11 NOTE — Patient Instructions (Signed)
Start Aspirin 81mg  once a day  Start Cardizem (Diltiazem) 120mg  once a day at bedtime  Decrease Lisinopril to 20mg  once a day (in the morning)

## 2021-01-19 ENCOUNTER — Other Ambulatory Visit: Payer: Self-pay

## 2021-01-19 ENCOUNTER — Other Ambulatory Visit (HOSPITAL_COMMUNITY): Payer: Self-pay | Admitting: *Deleted

## 2021-01-19 ENCOUNTER — Ambulatory Visit (HOSPITAL_COMMUNITY)
Admission: RE | Admit: 2021-01-19 | Discharge: 2021-01-19 | Disposition: A | Discharge status: Home / Self Care | Payer: Medicare HMO | Source: Ambulatory Visit | Attending: Nurse Practitioner | Admitting: Nurse Practitioner

## 2021-01-19 ENCOUNTER — Encounter (HOSPITAL_COMMUNITY): Payer: Self-pay | Admitting: Nurse Practitioner

## 2021-01-19 VITALS — BP 170/88 | HR 55 | Ht 66.0 in | Wt 122.6 lb

## 2021-01-19 DIAGNOSIS — D6869 Other thrombophilia: Secondary | ICD-10-CM | POA: Diagnosis not present

## 2021-01-19 DIAGNOSIS — I480 Paroxysmal atrial fibrillation: Secondary | ICD-10-CM | POA: Diagnosis not present

## 2021-01-19 DIAGNOSIS — I10 Essential (primary) hypertension: Secondary | ICD-10-CM | POA: Diagnosis not present

## 2021-01-19 MED ORDER — MAGNESIUM OXIDE 400 MG PO CAPS
400.0000 mg | ORAL_CAPSULE | Freq: Every day | ORAL | 0 refills | Status: AC
Start: 2021-01-19 — End: ?

## 2021-01-19 NOTE — Progress Notes (Signed)
Primary Care Physician: [name], DO Referring Physician: [facility] ER f/u Cardiologist: Dr. Eden Emms EP: Dr. [name] is a 87 y.o. female with a h/o paroxysmal afib in the afib clinic for f/u  tikosyn use. Reports no afib. She feels well. She is not on anticoagulation per her choice as she had repeat nose bleeds despite having cautery x 2. She was told the risks of stroke being off anticoagulation and she was clear she did not want to be on blood thinners any longer.  F/u afib clinic, 4/22 for an ER visit last week for afib with RVR. She spontaneously converted with a cardizem drip. Her K+ was low at 3.3, pt had stopped taking and was placed back on K +. She remains off anticoagulation 2/2 issues with nosebleeds a while back and clear in her decision not to take anticoagulation. She is in SR today.   F/u in afib clinic, 09/30/20. Pt reports that she had an episode of afib in May and IV diltiazem was given to which pt converted quickly. She was d/c home. She  still refuses anticoagulation. She also reports that she  had 2 episodes in September that she treated at home because the IV Cardizem left her feeling so weak. She is in SR today.   Pt  in back in the afib clinic, 11/24/20,  for increase in afib burden and issues with elevated BP. Her lisinopril was recently increased and she had been doing well with BP and no afib this week until one hour before this visit, her BP became elevated and was 190/80 on presentation. Rechecked during visit it lowered to 148/70. She had been having more afib but seems to have settled down. I discussed with her if afib excalates, may have to switch to amiodarone form tikosyn.   F/u in afib clinic,12/23/20. She states that she has been good at home for the last several weeks. No afib and BP at home is stable. Elevated on presentation today. Mild cough since increase of lisinopril. But states that she doe not feel a need for change in medication .    F/u in afib clinic for an ER visit with afib with RVR. She is fast in rhythm with a rate induced LBBB. She tries to handle at home but after 3rd episode in a day, she presented to ER. She broke shortly after that. The ER MD did discuss with her daily diltiazem with tikosyn. Daily meds were stopped a while back due to bradycardia. But can try this again as pt does not want to try amiodarone. She is willing to try to take ASA with h/o nose bleeds. Still prefers not to take DOAC.   F/u in afib clinic 01/19/21. She is doing well staying in SR since addition of diltiazem. Her HR is mid 50's but not symptomatic. She is tolerating asa without nosebleed.   Today, she denies symptoms of palpitations, chest pain, shortness of breath, orthopnea, PND, lower extremity edema, dizziness, presyncope, syncope, or neurologic sequela. The patient is tolerating medications without difficulties and is otherwise without complaint today.   Past Medical History:  Diagnosis Date  . Atrial fibrillation (HCC)    mostly in SR on Tikosyn  . Cataract   . HTN (hypertension)   . Rosacea    Past Surgical History:  Procedure Laterality Date  . VARICOSE VEIN SURGERY      Current Outpatient Medications  Medication Sig Dispense Refill  . aspirin EC 81 MG tablet  Take 1 tablet (81 mg total) by mouth daily. Swallow whole. 30 tablet 2  . Cholecalciferol (VITAMIN D-3) 25 MCG (1000 UT) CAPS Take 1,000 Units by mouth daily.     Marland Kitchen diltiazem (CARDIZEM CD) 120 MG 24 hr capsule Take 1 capsule (120 mg total) by mouth daily. 30 capsule 1  . diltiazem (CARDIZEM) 30 MG tablet Take 1/2-1 tablet every 4 hours AS NEEDED for heart rate >100 45 tablet 1  . dofetilide (TIKOSYN) 250 MCG capsule Take 1 capsule (250 mcg total) by mouth 2 (two) times daily. 60 capsule 6  . lisinopril (ZESTRIL) 20 MG tablet Take 1 tablet (20 mg total) by mouth daily. 180 tablet 1  . MAGNESIUM PO Take 1 tablet by mouth daily as needed ("for low magnesium").    .  mupirocin ointment (BACTROBAN) 2 % 1 application    . Polyethyl Glycol-Propyl Glycol (SYSTANE HYDRATION PF OP) Place 1 drop into both eyes 3 (three) times daily as needed (for dryness).    . potassium chloride SA (KLOR-CON M20) 20 MEQ tablet Take 0.5 tablets (10 mEq total) by mouth 2 (two) times daily. (Patient taking differently: Take 20 mEq by mouth daily in the afternoon.) 90 tablet 2  . VALERIAN ROOT PO Take 1 capsule by mouth daily as needed (to help improve sleep, promote relaxation, and/or reduce anxiety).    . vitamin B-12 (CYANOCOBALAMIN) 100 MCG tablet Take 100 mcg by mouth daily.     No current facility-administered medications for this encounter.    Allergies  Allergen Reactions  . Other Itching and Other (See Comments)    Dust and seasonal allergies- itchy eyes, runny nose, etc..    Social History   Socioeconomic History  . Marital status: Widowed    Spouse name: Not on file  . Number of children: Not on file  . Years of education: Not on file  . Highest education level: Not on file  Occupational History  . Not on file  Tobacco Use  . Smoking status: Never Smoker  . Smokeless tobacco: Never Used  Vaping Use  . Vaping Use: Never used  Substance and Sexual Activity  . Alcohol use: No  . Drug use: No  . Sexual activity: Not Currently  Other Topics Concern  . Not on file  Social History Narrative  . Not on file   Social Determinants of Health   Financial Resource Strain: Not on file  Food Insecurity: Not on file  Transportation Needs: Not on file  Physical Activity: Not on file  Stress: Not on file  Social Connections: Not on file  Intimate Partner Violence: Not on file    Family History  Problem Relation Age of Onset  . Heart Problems Mother     ROS- All systems are reviewed and negative except as per the HPI above  Physical Exam: Vitals:   01/19/21 0954  BP: (!) 170/88  Pulse: (!) 55  Weight: 55.6 kg  Height: 5\' 6"  (1.676 m)   Wt Readings  from Last 3 Encounters:  01/19/21 55.6 kg  01/11/21 55.1 kg  12/23/20 55.8 kg    Labs: Lab Results  Component Value Date   NA 140 01/09/2021   K 4.1 01/09/2021   CL 105 01/09/2021   CO2 24 01/09/2021   GLUCOSE 131 (H) 01/09/2021   BUN 13 01/09/2021   CREATININE 0.75 01/09/2021   CALCIUM 9.2 01/09/2021   MG 1.9 01/09/2021   Lab Results  Component Value Date   INR 1.1  05/05/2020   Lab Results  Component Value Date   CHOL  07/02/2008    130        ATP III CLASSIFICATION:  <200     mg/dL   Desirable  975-883  mg/dL   Borderline High  >=254    mg/dL   High   HDL 42 98/26/4158   LDLCALC  07/02/2008    71        Total Cholesterol/HDL:CHD Risk Coronary Heart Disease Risk Table                     Men   Women  1/2 Average Risk   3.4   3.3   TRIG 87 07/02/2008     GEN- The patient is well appearing, alert and oriented x 3 today.   Head- normocephalic, atraumatic Eyes-  Sclera clear, conjunctiva pink Ears- hearing intact Oropharynx- clear Neck- supple, no JVP Lymph- no cervical lymphadenopathy Lungs- Clear to ausculation bilaterally, normal work of breathing Heart- Regular rate and rhythm, no murmurs, rubs or gallops, PMI not laterally displaced GI- soft, NT, ND, + BS Extremities- no clubbing, cyanosis, or edema MS- no significant deformity or atrophy Skin- no rash or lesion Psych- euthymic mood, full affect Neuro- strength and sensation are intact  EKG-NSR, 55 bpm, pr int 180 ms, qrs int 84  ms, qtc 438 ms   Assessment and Plan: 1. Afib  Recent ER visit for afib, increase in burden over the last several months   Pt does not want to try amiodarone Continue daily cardizem 120 mg qd, but will have to watch HR carefully as she had bradycardia in the past with daily rate control, acceptable today in the 50's  Continue Tikosyn 250 mcg  Chadsvasc score of at least 4, has had 2 significant nosebleeds that ENT has not been able to identify a source, had even with ASA  and refused to go back on anticoagulation  Pt is willing to try asa again, so far tolerating  Pt is clear in her decision, knowing stroke risk, that she will no longer take DOAC   2. HTN Elevated on presentation here but controlled at home. Pt reassured me again today, that her systolic BP is 100-140 systolic at home, she checks  2x a day  Continue lisinopril to once a day with cardizem added  daily   F/u in afib clinic in April as scheduled    Lupita Leash C. [facility] [address] [place], [place] [address] [phone]

## 2021-01-27 ENCOUNTER — Encounter (HOSPITAL_COMMUNITY): Payer: Self-pay

## 2021-02-04 ENCOUNTER — Encounter (HOSPITAL_COMMUNITY): Payer: Self-pay | Admitting: Nurse Practitioner

## 2021-02-04 ENCOUNTER — Other Ambulatory Visit: Payer: Self-pay

## 2021-02-04 ENCOUNTER — Ambulatory Visit (HOSPITAL_COMMUNITY)
Admission: RE | Admit: 2021-02-04 | Discharge: 2021-02-04 | Disposition: A | Discharge status: Home / Self Care | Payer: Medicare HMO | Source: Ambulatory Visit | Attending: Nurse Practitioner | Admitting: Nurse Practitioner

## 2021-02-04 VITALS — BP 150/80 | HR 59 | Ht 66.0 in | Wt 121.6 lb

## 2021-02-04 DIAGNOSIS — D6869 Other thrombophilia: Secondary | ICD-10-CM | POA: Diagnosis not present

## 2021-02-04 DIAGNOSIS — I480 Paroxysmal atrial fibrillation: Secondary | ICD-10-CM

## 2021-02-04 DIAGNOSIS — Z7982 Long term (current) use of aspirin: Secondary | ICD-10-CM | POA: Diagnosis not present

## 2021-02-04 DIAGNOSIS — I10 Essential (primary) hypertension: Secondary | ICD-10-CM | POA: Diagnosis not present

## 2021-02-04 DIAGNOSIS — Z79899 Other long term (current) drug therapy: Secondary | ICD-10-CM | POA: Insufficient documentation

## 2021-02-04 MED ORDER — DILTIAZEM HCL ER COATED BEADS 120 MG PO CP24: ORAL_CAPSULE | ORAL | 1 refills | Status: DC

## 2021-02-04 MED ORDER — AMIODARONE HCL 200 MG PO TABS: ORAL_TABLET | ORAL | 0 refills | Status: DC

## 2021-02-04 NOTE — Patient Instructions (Addendum)
On Monday February 28th -- STOP tikosyn (dofetilide)     START cardizem (diltiazem) 120mg  once a day until Thursday then stop.  On Thursday, March 3rd -- START Amiodarone 200mg  twice a day with food

## 2021-02-04 NOTE — Progress Notes (Addendum)
Primary Care Physician: [name], DO Referring Physician: [facility] ER f/u Cardiologist: Dr. Eden Emms [name] is a 87 y.o. female with a h/o paroxysmal afib in the afib clinic for f/u  tikosyn use. Reports no afib. She feels well. She is not on anticoagulation per her choice as she had repeat nose bleeds despite having cautery x 2. She was told the risks of stroke being off anticoagulation and she was clear she did not want to be on blood thinners any longer.  F/u afib clinic, 4/22 for an ER visit last week for afib with RVR. She spontaneously converted with a cardizem drip. Her K+ was low at 3.3, pt had stopped taking and was placed back on K +. She remains off anticoagulation 2/2 issues with nosebleeds a while back and clear in her decision not to take anticoagulation. She is in SR today.   F/u in afib clinic, 09/30/20. Pt reports that she had an episode of afib in May and IV diltiazem was given to which pt converted quickly. She was d/c home. She  still refuses anticoagulation. She also reports that she  had 2 episodes in September that she treated at home because the IV Cardizem left her feeling so weak. She is in SR today.   Pt  in back in the afib clinic, 11/24/20,  for increase in afib burden and issues with elevated BP. Her lisinopril was recently increased and she had been doing well with BP and no afib this week until one hour before this visit, her BP became elevated and was 190/80 on presentation. Rechecked during visit it lowered to 148/70. She had been having more afib but seems to have settled down. I discussed with her if afib excalates, may have to switch to amiodarone form tikosyn.   F/u in afib clinic,12/23/20. She states that she has been good at home for the last several weeks. No afib and BP at home is stable. Elevated on presentation today. Mild cough since increase of lisinopril. But states that she doe not feel a need for change in medication .    F/u in afib clinic for an ER visit with afib with RVR. She is fast in rhythm with a rate induced LBBB. She tries to handle at home but after 3rd episode in a day, she presented to ER. She broke shortly after that. The ER MD did discuss with her daily diltiazem with tikosyn. Daily meds were stopped a while back due to bradycardia. But can try this again as pt does not want to try amiodarone. She is willing to try to take ASA with h/o nose bleeds. Still prefers not to take DOAC.   F/u in afib clinic 01/19/21. She is doing well staying in SR since addition of diltiazem. Her HR is mid 50's but not symptomatic. She is tolerating asa without nosebleed.   F/u in afib clinic, 02/05/21, with her DIL.  She called the office and reported that she stopped daily Cardizem as it was making her feel fatigued and her heart rate slow. She is here to discuss other options. I have discussed amiodarone in the past but she has been adamant that she did not want to take this med 2/2 SE's and concerns with her thyroid. I discussed with Dr. Elberta Fortis and he suggested amio with close watch of her thyroid vrs PPM for her tachy/brady and then she can continue Tikosyn with ability to add more rate control. I discussed with  her again amiodarone or PPM and  after much discussion decided that she would try the amiodarone. Her TSH was mildly elevated in the recent  past but her Free T4 was normal. She states that she had thyroid nodules 15-20 years ago. She denies any lung issues or shortness of breath. She is having frequent breakthrough afib. Liver panel is normal.   Today, she denies symptoms of palpitations, chest pain, shortness of breath, orthopnea, PND, lower extremity edema, dizziness, presyncope, syncope, or neurologic sequela. The patient is tolerating medications without difficulties and is otherwise without complaint today.   Past Medical History:  Diagnosis Date  . Atrial fibrillation (HCC)    mostly in SR on Tikosyn  .  Cataract   . HTN (hypertension)   . Rosacea    Past Surgical History:  Procedure Laterality Date  . VARICOSE VEIN SURGERY      Current Outpatient Medications  Medication Sig Dispense Refill  . aspirin EC 81 MG tablet Take 1 tablet (81 mg total) by mouth daily. Swallow whole. 30 tablet 2  . Cholecalciferol (VITAMIN D-3) 25 MCG (1000 UT) CAPS Take 1,000 Units by mouth daily.     Marland Kitchen diltiazem (CARDIZEM CD) 120 MG 24 hr capsule Take 1 capsule (120 mg total) by mouth daily. 30 capsule 1  . diltiazem (CARDIZEM) 30 MG tablet Take 1/2-1 tablet every 4 hours AS NEEDED for heart rate >100 45 tablet 1  . dofetilide (TIKOSYN) 250 MCG capsule Take 1 capsule (250 mcg total) by mouth 2 (two) times daily. 60 capsule 6  . lisinopril (ZESTRIL) 20 MG tablet Take 1 tablet (20 mg total) by mouth daily. 180 tablet 1  . Magnesium Oxide 400 MG CAPS Take 1 capsule (400 mg total) by mouth daily. Nature Made Brand  0  . mupirocin ointment (BACTROBAN) 2 % 1 application    . Polyethyl Glycol-Propyl Glycol (SYSTANE HYDRATION PF OP) Place 1 drop into both eyes 3 (three) times daily as needed (for dryness).    . potassium chloride SA (KLOR-CON M20) 20 MEQ tablet Take 0.5 tablets (10 mEq total) by mouth 2 (two) times daily. (Patient taking differently: Take 20 mEq by mouth daily in the afternoon.) 90 tablet 2  . VALERIAN ROOT PO Take 1 capsule by mouth daily as needed (to help improve sleep, promote relaxation, and/or reduce anxiety).    . vitamin B-12 (CYANOCOBALAMIN) 100 MCG tablet Take 100 mcg by mouth daily.     No current facility-administered medications for this encounter.    Allergies  Allergen Reactions  . Other Itching and Other (See Comments)    Dust and seasonal allergies- itchy eyes, runny nose, etc..    Social History   Socioeconomic History  . Marital status: Widowed    Spouse name: Not on file  . Number of children: Not on file  . Years of education: Not on file  . Highest education level: Not  on file  Occupational History  . Not on file  Tobacco Use  . Smoking status: Never Smoker  . Smokeless tobacco: Never Used  Vaping Use  . Vaping Use: Never used  Substance and Sexual Activity  . Alcohol use: No  . Drug use: No  . Sexual activity: Not Currently  Other Topics Concern  . Not on file  Social History Narrative  . Not on file   Social Determinants of Health   Financial Resource Strain: Not on file  Food Insecurity: Not on file  Transportation Needs: Not on file  Physical Activity: Not on file  Stress: Not on file  Social Connections: Not on file  Intimate Partner Violence: Not on file    Family History  Problem Relation Age of Onset  . Heart Problems Mother     ROS- All systems are reviewed and negative except as per the HPI above  Physical Exam: There were no vitals filed for this visit. Wt Readings from Last 3 Encounters:  01/19/21 55.6 kg  01/11/21 55.1 kg  12/23/20 55.8 kg    Labs: Lab Results  Component Value Date   NA 140 01/09/2021   K 4.1 01/09/2021   CL 105 01/09/2021   CO2 24 01/09/2021   GLUCOSE 131 (H) 01/09/2021   BUN 13 01/09/2021   CREATININE 0.75 01/09/2021   CALCIUM 9.2 01/09/2021   MG 1.9 01/09/2021   Lab Results  Component Value Date   INR 1.1 05/05/2020   Lab Results  Component Value Date   CHOL  07/02/2008    130        ATP III CLASSIFICATION:  <200     mg/dL   Desirable  818-563  mg/dL   Borderline High  >=149    mg/dL   High   HDL 42 70/26/3785   LDLCALC  07/02/2008    71        Total Cholesterol/HDL:CHD Risk Coronary Heart Disease Risk Table                     Men   Women  1/2 Average Risk   3.4   3.3   TRIG 87 07/02/2008     GEN- The patient is well appearing, alert and oriented x 3 today.   Head- normocephalic, atraumatic Eyes-  Sclera clear, conjunctiva pink Ears- hearing intact Oropharynx- clear Neck- supple, no JVP Lymph- no cervical lymphadenopathy Lungs- Clear to ausculation bilaterally,  normal work of breathing Heart- Regular rate and rhythm, no murmurs, rubs or gallops, PMI not laterally displaced GI- soft, NT, ND, + BS Extremities- no clubbing, cyanosis, or edema MS- no significant deformity or atrophy Skin- no rash or lesion Psych- euthymic mood, full affect Neuro- strength and sensation are intact  EKG-NSR, 55 bpm, pr int 180 ms, qrs int 84  ms, qtc 438 ms    Assessment and Plan: 1. Afib  Recent ER visit for afib, increase in burden over the last several months   Discussed with pt amiodarone vrs PPM with ability to increase rate control with Tikosyn   She wants to try amiodarone after full discussion of risk vrs benefit of each She will stop tikosyn on Monday x 3 days and then start amiodarone 200 mg bid Thursday  and will return to clinic next Wednesday fore EKG. While off tikosyn, take diltiazem 120 mg daily and then stop drug when starting amiodarone to prevent bradycardia  Chadsvasc score of at least 4, has had 2 significant nosebleeds that ENT has not been able to identify a source, had even with ASA and refused to go back on anticoagulation  Pt is back on  asa again, so far tolerating  Pt is clear in her decision, knowing stroke risk, that she will no longer take DOAC   2. HTN Stable   F/u in afib clinic in mid week for EKG/BP check   Lupita Leash [facility] [address] [place], [place] [address] [phone]

## 2021-02-09 ENCOUNTER — Encounter (HOSPITAL_COMMUNITY): Payer: Medicare HMO | Admitting: Nurse Practitioner

## 2021-02-16 ENCOUNTER — Other Ambulatory Visit: Payer: Self-pay

## 2021-02-16 ENCOUNTER — Ambulatory Visit (HOSPITAL_COMMUNITY)
Admission: RE | Admit: 2021-02-16 | Discharge: 2021-02-16 | Disposition: A | Discharge status: Home / Self Care | Payer: Medicare HMO | Source: Ambulatory Visit | Attending: Nurse Practitioner | Admitting: Nurse Practitioner

## 2021-02-16 VITALS — BP 160/90 | HR 51

## 2021-02-16 DIAGNOSIS — Z79899 Other long term (current) drug therapy: Secondary | ICD-10-CM | POA: Diagnosis not present

## 2021-02-16 DIAGNOSIS — I480 Paroxysmal atrial fibrillation: Secondary | ICD-10-CM

## 2021-02-16 DIAGNOSIS — I4891 Unspecified atrial fibrillation: Secondary | ICD-10-CM | POA: Diagnosis not present

## 2021-02-16 NOTE — Progress Notes (Signed)
Pt in for ekg after starting amiodarone 200 mg bid, last Thursday  after Tikosyn stopped x 3 days prior. She has not noted any afib. HR's in the low 50's. She will continue 200 mg bid for 14 days then reduce to 1 a day. I will see back 3/24. PR int 190 ms, qrs int 130 ms, qtc 468 ms.

## 2021-02-16 NOTE — Patient Instructions (Signed)
On March 17th reduce amiodarone 200mg  once a day

## 2021-02-18 ENCOUNTER — Telehealth (HOSPITAL_COMMUNITY): Payer: Self-pay | Admitting: *Deleted

## 2021-02-18 MED ORDER — LISINOPRIL 20 MG PO TABS: 20.0000 mg | ORAL_TABLET | Freq: Two times a day (BID) | ORAL | 1 refills | Status: DC

## 2021-02-18 MED ORDER — AMLODIPINE BESYLATE 2.5 MG PO TABS: 2.5000 mg | ORAL_TABLET | Freq: Every day | ORAL | 3 refills | Status: DC

## 2021-02-18 MED ORDER — AMIODARONE HCL 200 MG PO TABS: 200.0000 mg | ORAL_TABLET | Freq: Every day | ORAL | 1 refills | Status: DC

## 2021-02-18 NOTE — Telephone Encounter (Addendum)
Patient's daughter in law called in stating pt heart rate is running lower and her BP is running high. HR 38-40 BP 182/100.  Discussed with Rudi Coco NP will decrease amiodarone to 200mg  once a day and add norvasc 2.5mg  once a day. Call with update of response to medication changes next week. DIL in agreement and verbalized understanding of medication changes.

## 2021-02-24 ENCOUNTER — Telehealth (HOSPITAL_COMMUNITY): Payer: Self-pay | Admitting: *Deleted

## 2021-02-24 MED ORDER — AMIODARONE HCL 200 MG PO TABS: 100.0000 mg | ORAL_TABLET | Freq: Every day | ORAL | 1 refills | Status: DC

## 2021-02-24 NOTE — Telephone Encounter (Signed)
Patient's daughter in law called in stating patient is having intermittent episodes of shortness of breath in the daytime - during those episodes if they check her HR its 42-45 otherwise HR 52-55. Discussed with Jorja Loa PA will decrease amiodarone to 100mg  daily and call with update Monday. DIL verbalized understanding.

## 2021-03-03 ENCOUNTER — Ambulatory Visit (HOSPITAL_COMMUNITY)
Admission: RE | Admit: 2021-03-03 | Discharge: 2021-03-03 | Disposition: A | Discharge status: Home / Self Care | Payer: Medicare HMO | Source: Ambulatory Visit | Attending: Nurse Practitioner | Admitting: Nurse Practitioner

## 2021-03-03 ENCOUNTER — Encounter (HOSPITAL_COMMUNITY): Payer: Self-pay | Admitting: Nurse Practitioner

## 2021-03-03 ENCOUNTER — Other Ambulatory Visit: Payer: Self-pay

## 2021-03-03 VITALS — BP 142/74 | HR 60 | Ht 66.0 in | Wt 121.2 lb

## 2021-03-03 DIAGNOSIS — I4891 Unspecified atrial fibrillation: Secondary | ICD-10-CM | POA: Insufficient documentation

## 2021-03-03 DIAGNOSIS — Z7982 Long term (current) use of aspirin: Secondary | ICD-10-CM | POA: Insufficient documentation

## 2021-03-03 DIAGNOSIS — I480 Paroxysmal atrial fibrillation: Secondary | ICD-10-CM

## 2021-03-03 DIAGNOSIS — Z5329 Procedure and treatment not carried out because of patient's decision for other reasons: Secondary | ICD-10-CM | POA: Insufficient documentation

## 2021-03-03 DIAGNOSIS — I10 Essential (primary) hypertension: Secondary | ICD-10-CM | POA: Insufficient documentation

## 2021-03-03 DIAGNOSIS — Z79899 Other long term (current) drug therapy: Secondary | ICD-10-CM | POA: Insufficient documentation

## 2021-03-03 MED ORDER — LISINOPRIL 20 MG PO TABS: 20.0000 mg | ORAL_TABLET | Freq: Two times a day (BID) | ORAL | 2 refills | Status: DC

## 2021-03-03 NOTE — Progress Notes (Signed)
Primary Care Physician: [name], DO Referring Physician: [facility] ER f/u Cardiologist: Dr. Eden Emms [name] is a 87 y.o. female with a h/o paroxysmal afib in the afib clinic for f/u  tikosyn use. Reports no afib. She feels well. She is not on anticoagulation per her choice as she had repeat nose bleeds despite having cautery x 2. She was told the risks of stroke being off anticoagulation and she was clear she did not want to be on blood thinners any longer.  F/u afib clinic, 4/22 for an ER visit last week for afib with RVR. She spontaneously converted with a cardizem drip. Her K+ was low at 3.3, pt had stopped taking and was placed back on K +. She remains off anticoagulation 2/2 issues with nosebleeds a while back and clear in her decision not to take anticoagulation. She is in SR today.   F/u in afib clinic, 09/30/20. Pt reports that she had an episode of afib in May and IV diltiazem was given to which pt converted quickly. She was d/c home. She  still refuses anticoagulation. She also reports that she  had 2 episodes in September that she treated at home because the IV Cardizem left her feeling so weak. She is in SR today.   Pt  in back in the afib clinic, 11/24/20,  for increase in afib burden and issues with elevated BP. Her lisinopril was recently increased and she had been doing well with BP and no afib this week until one hour before this visit, her BP became elevated and was 190/80 on presentation. Rechecked during visit it lowered to 148/70. She had been having more afib but seems to have settled down. I discussed with her if afib excalates, may have to switch to amiodarone form tikosyn.   F/u in afib clinic,12/23/20. She states that she has been good at home for the last several weeks. No afib and BP at home is stable. Elevated on presentation today. Mild cough since increase of lisinopril. But states that she doe not feel a need for change in medication .    F/u in afib clinic for an ER visit with afib with RVR. She is fast in rhythm with a rate induced LBBB. She tries to handle at home but after 3rd episode in a day, she presented to ER. She broke shortly after that. The ER MD did discuss with her daily diltiazem with tikosyn. Daily meds were stopped a while back due to bradycardia. But can try this again as pt does not want to try amiodarone. She is willing to try to take ASA with h/o nose bleeds. Still prefers not to take DOAC.   F/u in afib clinic 01/19/21. She is doing well staying in SR since addition of diltiazem. Her HR is mid 50's but not symptomatic. She is tolerating asa without nosebleed.   F/u in afib clinic, 02/05/21, with her DIL.  She called the office and reported that she stopped daily Cardizem as it was making her feel fatigued and her heart rate slow. She is here to discuss other options. I have discussed amiodarone in the past but she has been adamant that she did not want to take this med 2/2 SE's and concerns with her thyroid. I discussed with Dr. Elberta Fortis and he suggested amio with close watch of her thyroid vrs PPM for her tachy/brady and then she can continue Tikosyn with ability to add more rate control. I discussed with  her again amiodarone or PPM and  after much discussion decided that she would try the amiodarone. Her TSH was mildly elevated in the recent  past but her Free T4 was normal. She states that she had thyroid nodules 15-20 years ago. She denies any lung issues or shortness of breath. She is having frequent breakthrough afib. Liver panel is normal.   F/u in afib clinic, 4/25,  one month starting amiodarone. She was on 200 mg bid x 7-10 days and it was reduced as she noted slow heart rates at home. Then she called in this week with some shortness of breath and the amiodarone was reduced to 100 mg daily. Now she mentions that she eats breakfast at 8:00 am and takes amio at 9:30 am and will feel nauseated for a short while  afterward. She has noted some intermittent drops of HR and sounds as she is describing PC's. Her BP has been better controlled with the addition of amlodipine to lisinopril.  Today, she denies symptoms of palpitations, chest pain, shortness of breath, orthopnea, PND, lower extremity edema, dizziness, presyncope, syncope, or neurologic sequela. The patient is tolerating medications without difficulties and is otherwise without complaint today.   Past Medical History:  Diagnosis Date  . Atrial fibrillation (HCC)    mostly in SR on Tikosyn  . Cataract   . HTN (hypertension)   . Rosacea    Past Surgical History:  Procedure Laterality Date  . VARICOSE VEIN SURGERY      Current Outpatient Medications  Medication Sig Dispense Refill  . amiodarone (PACERONE) 200 MG tablet Take 0.5 tablets (100 mg total) by mouth daily. 30 tablet 1  . amLODipine (NORVASC) 2.5 MG tablet Take 1 tablet (2.5 mg total) by mouth daily after lunch. 30 tablet 3  . aspirin EC 81 MG tablet Take 1 tablet (81 mg total) by mouth daily. Swallow whole. 30 tablet 2  . Cholecalciferol (VITAMIN D-3) 25 MCG (1000 UT) CAPS Take 1,000 Units by mouth daily.     Marland Kitchen diltiazem (CARDIZEM) 30 MG tablet Take 1/2-1 tablet every 4 hours AS NEEDED for heart rate >100 45 tablet 1  . Magnesium Oxide 400 MG CAPS Take 1 capsule (400 mg total) by mouth daily. Nature Made Brand  0  . potassium chloride SA (KLOR-CON M20) 20 MEQ tablet Take 0.5 tablets (10 mEq total) by mouth 2 (two) times daily. (Patient taking differently: Take 20 mEq by mouth daily in the afternoon.) 90 tablet 2  . VALERIAN ROOT PO Take 1 capsule by mouth daily as needed (to help improve sleep, promote relaxation, and/or reduce anxiety).    . vitamin B-12 (CYANOCOBALAMIN) 100 MCG tablet Take 100 mcg by mouth daily.    Marland Kitchen lisinopril (ZESTRIL) 20 MG tablet Take 1 tablet (20 mg total) by mouth 2 (two) times daily. 180 tablet 2  . mupirocin ointment (BACTROBAN) 2 % 1 application    .  Polyethyl Glycol-Propyl Glycol (SYSTANE HYDRATION PF OP) Place 1 drop into both eyes 3 (three) times daily as needed (for dryness).     No current facility-administered medications for this encounter.    Allergies  Allergen Reactions  . Other Itching and Other (See Comments)    Dust and seasonal allergies- itchy eyes, runny nose, etc..    Social History   Socioeconomic History  . Marital status: Widowed    Spouse name: Not on file  . Number of children: Not on file  . Years of education: Not on file  .  Highest education level: Not on file  Occupational History  . Not on file  Tobacco Use  . Smoking status: Never Smoker  . Smokeless tobacco: Never Used  Vaping Use  . Vaping Use: Never used  Substance and Sexual Activity  . Alcohol use: No  . Drug use: No  . Sexual activity: Not Currently  Other Topics Concern  . Not on file  Social History Narrative  . Not on file   Social Determinants of Health   Financial Resource Strain: Not on file  Food Insecurity: Not on file  Transportation Needs: Not on file  Physical Activity: Not on file  Stress: Not on file  Social Connections: Not on file  Intimate Partner Violence: Not on file    Family History  Problem Relation Age of Onset  . Heart Problems Mother     ROS- All systems are reviewed and negative except as per the HPI above  Physical Exam: Vitals:   03/03/21 1434  BP: (!) 142/74  Pulse: 60  Weight: 55 kg  Height: 5\' 6"  (1.676 m)   Wt Readings from Last 3 Encounters:  03/03/21 55 kg  02/04/21 55.2 kg  01/19/21 55.6 kg    Labs: Lab Results  Component Value Date   NA 140 01/09/2021   K 4.1 01/09/2021   CL 105 01/09/2021   CO2 24 01/09/2021   GLUCOSE 131 (H) 01/09/2021   BUN 13 01/09/2021   CREATININE 0.75 01/09/2021   CALCIUM 9.2 01/09/2021   MG 1.9 01/09/2021   Lab Results  Component Value Date   INR 1.1 05/05/2020   Lab Results  Component Value Date   CHOL  07/02/2008    130        ATP  III CLASSIFICATION:  <200     mg/dL   Desirable  07/04/2008  mg/dL   Borderline High  607-371    mg/dL   High   HDL 42 >=062   LDLCALC  07/02/2008    71        Total Cholesterol/HDL:CHD Risk Coronary Heart Disease Risk Table                     Men   Women  1/2 Average Risk   3.4   3.3   TRIG 87 07/02/2008     GEN- The patient is well appearing, alert and oriented x 3 today.   Head- normocephalic, atraumatic Eyes-  Sclera clear, conjunctiva pink Ears- hearing intact Oropharynx- clear Neck- supple, no JVP Lymph- no cervical lymphadenopathy Lungs- Clear to ausculation bilaterally, normal work of breathing Heart- Regular rate and rhythm, no murmurs, rubs or gallops, PMI not laterally displaced GI- soft, NT, ND, + BS Extremities- no clubbing, cyanosis, or edema MS- no significant deformity or atrophy Skin- no rash or lesion Psych- euthymic mood, full affect Neuro- strength and sensation are intact  EKG-NSR, 60  bpm, pr int 190 ms, qrs int 128 ms, qtc 438 ms    Assessment and Plan: 1. Afib  Recent ER visit for afib, increase in burden over the last several months   Discussed with pt amiodarone vrs PPM with ability to increase rate control with Tikosyn   She wanted to proceed with amiodarone She stopped  tikosyn x 3 days and then started amiodarone 200 mg bid x 7-10 days and then was reduced to 200 mg daily. Now she is on 100 mg daily for some spells of shortness of breath and bradycardia. HR 60 bpm  in office today, and describes something similiar to PC's. Will place a one week monitor.  Also describing some nausea. Taking amiodarone  1/1/2 hours after breakfast. Was instructed to try to take with breakfast.  Chadsvasc score of at least 4, has had 2 significant nosebleeds that ENT has not been able to identify a source, had even with ASA and refused to go back on anticoagulation  Pt is back on  asa again, so far tolerating  Pt is clear in her decision, knowing stroke risk, that  she will no longer take DOAC   2. HTN Stable  Better sine adding amlodipine Continues lisinopril bid  F/u in afib clinic  In 2 weeks    Lupita Leash C[facility] [address] [place], [place] [address] [phone]

## 2021-03-04 ENCOUNTER — Other Ambulatory Visit (HOSPITAL_COMMUNITY): Payer: Self-pay | Admitting: Nurse Practitioner

## 2021-03-14 ENCOUNTER — Telehealth: Payer: Self-pay | Admitting: Cardiology

## 2021-03-14 DIAGNOSIS — I480 Paroxysmal atrial fibrillation: Secondary | ICD-10-CM | POA: Diagnosis not present

## 2021-03-14 NOTE — Telephone Encounter (Signed)
Called by monitoring company that pt had 5.8 sec pause on 03/05/21.  - I will send info to Rudi Coco  With a fib clinic.

## 2021-03-17 ENCOUNTER — Encounter (HOSPITAL_COMMUNITY): Payer: Self-pay | Admitting: Nurse Practitioner

## 2021-03-17 ENCOUNTER — Other Ambulatory Visit: Payer: Self-pay

## 2021-03-17 ENCOUNTER — Ambulatory Visit (HOSPITAL_COMMUNITY)
Admission: RE | Admit: 2021-03-17 | Discharge: 2021-03-17 | Disposition: A | Discharge status: Home / Self Care | Payer: Medicare HMO | Source: Ambulatory Visit | Attending: Nurse Practitioner | Admitting: Nurse Practitioner

## 2021-03-17 VITALS — BP 138/70 | HR 50 | Ht 66.0 in | Wt 125.2 lb

## 2021-03-17 DIAGNOSIS — Z7982 Long term (current) use of aspirin: Secondary | ICD-10-CM | POA: Insufficient documentation

## 2021-03-17 DIAGNOSIS — Z79899 Other long term (current) drug therapy: Secondary | ICD-10-CM | POA: Insufficient documentation

## 2021-03-17 DIAGNOSIS — I495 Sick sinus syndrome: Secondary | ICD-10-CM

## 2021-03-17 DIAGNOSIS — I10 Essential (primary) hypertension: Secondary | ICD-10-CM | POA: Diagnosis not present

## 2021-03-17 DIAGNOSIS — Z5320 Procedure and treatment not carried out because of patient's decision for unspecified reasons: Secondary | ICD-10-CM | POA: Insufficient documentation

## 2021-03-17 DIAGNOSIS — I480 Paroxysmal atrial fibrillation: Secondary | ICD-10-CM | POA: Insufficient documentation

## 2021-03-17 LAB — COMPREHENSIVE METABOLIC PANEL
ALT: 18 U/L (ref 0–44)
AST: 29 U/L (ref 15–41)
Albumin: 3.7 g/dL (ref 3.5–5.0)
Alkaline Phosphatase: 58 U/L (ref 38–126)
Anion gap: 6 (ref 5–15)
BUN: 18 mg/dL (ref 8–23)
CO2: 26 mmol/L (ref 22–32)
Calcium: 8.7 mg/dL — ABNORMAL LOW (ref 8.9–10.3)
Chloride: 100 mmol/L (ref 98–111)
Creatinine, Ser: 0.87 mg/dL (ref 0.44–1.00)
GFR, Estimated: >60 mL/min (ref >60)
Glucose, Bld: 98 mg/dL (ref 70–99)
Potassium: 4 mmol/L (ref 3.5–5.1)
Sodium: 132 mmol/L — ABNORMAL LOW (ref 135–145)
Total Bilirubin: 0.5 mg/dL (ref 0.3–1.2)
Total Protein: 6.7 g/dL (ref 6.5–8.1)

## 2021-03-17 LAB — TSH: TSH: 24.498 u[IU]/mL — ABNORMAL HIGH (ref 0.350–4.500)

## 2021-03-17 NOTE — Addendum Note (Signed)
Encounter addended by: Newman Nip, NP on: 03/17/2021 3:00 PM  Actions taken: Clinical Note Signed

## 2021-03-17 NOTE — Progress Notes (Addendum)
Primary Care Physician: [name], DO Referring Physician: [facility] ER f/u Cardiologist: Dr. Eden Emms EP: Dr. [name] is a 87 y.o. female with a h/o paroxysmal afib in the afib clinic for f/u  tikosyn use. Reports no afib. She feels well. She is not on anticoagulation per her choice as she had repeat nose bleeds despite having cautery x 2. She was told the risks of stroke being off anticoagulation and she was clear she did not want to be on blood thinners any longer.  F/u afib clinic, 4/22 for an ER visit last week for afib with RVR. She spontaneously converted with a cardizem drip. Her K+ was low at 3.3, pt had stopped taking and was placed back on K +. She remains off anticoagulation 2/2 issues with nosebleeds a while back and clear in her decision not to take anticoagulation. She is in SR today.   F/u in afib clinic, 09/30/20. Pt reports that she had an episode of afib in May and IV diltiazem was given to which pt converted quickly. She was d/c home. She  still refuses anticoagulation. She also reports that she  had 2 episodes in September that she treated at home because the IV Cardizem left her feeling so weak. She is in SR today.   Pt  in back in the afib clinic, 11/24/20,  for increase in afib burden and issues with elevated BP. Her lisinopril was recently increased and she had been doing well with BP and no afib this week until one hour before this visit, her BP became elevated and was 190/80 on presentation. Rechecked during visit it lowered to 148/70. She had been having more afib but seems to have settled down. I discussed with her if afib excalates, may have to switch to amiodarone form tikosyn.   F/u in afib clinic,12/23/20. She states that she has been good at home for the last several weeks. No afib and BP at home is stable. Elevated on presentation today. Mild cough since increase of lisinopril. But states that she doe not feel a need for change in medication .    F/u in afib clinic for an ER visit with afib with RVR. She is fast in rhythm with a rate induced LBBB. She tries to handle at home but after 3rd episode in a day, she presented to ER. She broke shortly after that. The ER MD did discuss with her daily diltiazem with tikosyn. Daily meds were stopped a while back due to bradycardia. But can try this again as pt does not want to try amiodarone. She is willing to try to take ASA with h/o nose bleeds. Still prefers not to take DOAC.   F/u in afib clinic 01/19/21. She is doing well staying in SR since addition of diltiazem. Her HR is mid 50's but not symptomatic. She is tolerating asa without nosebleed.   F/u in afib clinic, 02/05/21, with her DIL.  She called the office and reported that she stopped daily Cardizem as it was making her feel fatigued and her heart rate slow. She is here to discuss other options. I have discussed amiodarone in the past but she has been adamant that she did not want to take this med 2/2 SE's and concerns with her thyroid. I discussed with Dr. Elberta Fortis and he suggested amio with close watch of her thyroid vrs PPM for her tachy/brady and then she can continue Tikosyn with ability to add more rate control. I discussed with  her again amiodarone or PPM and  after much discussion decided that she would try the amiodarone. Her TSH was mildly elevated in the recent  past but her Free T4 was normal. She states that she had thyroid nodules 15-20 years ago. She denies any lung issues or shortness of breath. She is having frequent breakthrough afib. Liver panel is normal.   F/u in afib clinic, 4/25,  one month starting amiodarone. She was on 200 mg bid x 7-10 days and it was reduced as she noted slow heart rates at home. Then she called in this week with some shortness of breath and the amiodarone was reduced to 100 mg daily. Now she mentions that she eats breakfast at 8:00 am and takes amio at 9:30 am and will feel nauseated for a short while  afterward. She has noted some intermittent drops of HR and sounds as she is describing PC's. Her BP has been better controlled with the addition of amlodipine to lisinopril.  F/u in afib clinic, 03/17/21. She feels better with nausea resolved now with amio at 100 mg daily. However, the monitor I placed last time showed 16 significant pauses up to 5.8 sec, mostly nocturnal, minimal HR of 29 bpm. She feels sinking episodes at times. She is not on any rate control other than  100 mg amiodarone. The afib is improved on amiodarone.   Today, she denies symptoms of palpitations, chest pain, shortness of breath, orthopnea, PND, lower extremity edema, dizziness, presyncope, syncope, or neurologic sequela. The patient is tolerating medications without difficulties and is otherwise without complaint today.   Past Medical History:  Diagnosis Date  . Atrial fibrillation (HCC)    mostly in SR on Tikosyn  . Cataract   . HTN (hypertension)   . Rosacea    Past Surgical History:  Procedure Laterality Date  . VARICOSE VEIN SURGERY      Current Outpatient Medications  Medication Sig Dispense Refill  . amiodarone (PACERONE) 200 MG tablet Take 0.5 tablets (100 mg total) by mouth daily. 45 tablet 1  . amLODipine (NORVASC) 2.5 MG tablet Take 1 tablet (2.5 mg total) by mouth daily after lunch. 30 tablet 3  . aspirin EC 81 MG tablet Take 1 tablet (81 mg total) by mouth daily. Swallow whole. 30 tablet 2  . lisinopril (ZESTRIL) 20 MG tablet Take 1 tablet (20 mg total) by mouth 2 (two) times daily. 180 tablet 2  . Magnesium Oxide 400 MG CAPS Take 1 capsule (400 mg total) by mouth daily. Nature Made Brand  0  . potassium chloride SA (KLOR-CON M20) 20 MEQ tablet Take 0.5 tablets (10 mEq total) by mouth 2 (two) times daily. (Patient taking differently: Take 20 mEq by mouth daily in the afternoon.) 90 tablet 2  . VALERIAN ROOT PO Take 1 capsule by mouth daily as needed (to help improve sleep, promote relaxation, and/or  reduce anxiety).    . vitamin B-12 (CYANOCOBALAMIN) 100 MCG tablet Take 100 mcg by mouth daily.    . Cholecalciferol (VITAMIN D-3) 25 MCG (1000 UT) CAPS Take 1,000 Units by mouth daily.     Marland Kitchen diltiazem (CARDIZEM) 30 MG tablet Take 1/2-1 tablet every 4 hours AS NEEDED for heart rate >100 45 tablet 1  . mupirocin ointment (BACTROBAN) 2 % 1 application    . Polyethyl Glycol-Propyl Glycol (SYSTANE HYDRATION PF OP) Place 1 drop into both eyes 3 (three) times daily as needed (for dryness).     No current facility-administered medications for this  encounter.    Allergies  Allergen Reactions  . Other Itching and Other (See Comments)    Dust and seasonal allergies- itchy eyes, runny nose, etc..    Social History   Socioeconomic History  . Marital status: Widowed    Spouse name: Not on file  . Number of children: Not on file  . Years of education: Not on file  . Highest education level: Not on file  Occupational History  . Not on file  Tobacco Use  . Smoking status: Never Smoker  . Smokeless tobacco: Never Used  Vaping Use  . Vaping Use: Never used  Substance and Sexual Activity  . Alcohol use: No  . Drug use: No  . Sexual activity: Not Currently  Other Topics Concern  . Not on file  Social History Narrative  . Not on file   Social Determinants of Health   Financial Resource Strain: Not on file  Food Insecurity: Not on file  Transportation Needs: Not on file  Physical Activity: Not on file  Stress: Not on file  Social Connections: Not on file  Intimate Partner Violence: Not on file    Family History  Problem Relation Age of Onset  . Heart Problems Mother     ROS- All systems are reviewed and negative except as per the HPI above  Physical Exam: Vitals:   03/17/21 1425  BP: 138/70  Pulse: (!) 50  Weight: 56.8 kg  Height: 5\' 6"  (1.676 m)   Wt Readings from Last 3 Encounters:  03/17/21 56.8 kg  03/03/21 55 kg  02/04/21 55.2 kg    Labs: Lab Results   Component Value Date   NA 140 01/09/2021   K 4.1 01/09/2021   CL 105 01/09/2021   CO2 24 01/09/2021   GLUCOSE 131 (H) 01/09/2021   BUN 13 01/09/2021   CREATININE 0.75 01/09/2021   CALCIUM 9.2 01/09/2021   MG 1.9 01/09/2021   Lab Results  Component Value Date   INR 1.1 05/05/2020   Lab Results  Component Value Date   CHOL  07/02/2008    130        ATP III CLASSIFICATION:  <200     mg/dL   Desirable  07/04/2008  mg/dL   Borderline High  179-150    mg/dL   High   HDL 42 >=569   LDLCALC  07/02/2008    71        Total Cholesterol/HDL:CHD Risk Coronary Heart Disease Risk Table                     Men   Women  1/2 Average Risk   3.4   3.3   TRIG 87 07/02/2008     GEN- The patient is well appearing, alert and oriented x 3 today.   Head- normocephalic, atraumatic Eyes-  Sclera clear, conjunctiva pink Ears- hearing intact Oropharynx- clear Neck- supple, no JVP Lymph- no cervical lymphadenopathy Lungs- Clear to ausculation bilaterally, normal work of breathing Heart- Regular rate and rhythm, no murmurs, rubs or gallops, PMI not laterally displaced GI- soft, NT, ND, + BS Extremities- no clubbing, cyanosis, or edema MS- no significant deformity or atrophy Skin- no rash or lesion Psych- euthymic mood, full affect Neuro- strength and sensation are intact  EKG-NSR, 50 bpm, pr int 164 ms, qrs int 126 ms, qtc 450 ms  Please see Zio patch report from 4/5 under CV proc    Assessment and Plan: 1. Afib  Many ER visits  for breakthrough  Afib, while on tikosyn,  increase in burden over the last several months   Symptomatic bradycardia with rate control meds  Discussed with pt amiodarone vrs PPM with ability to increase rate control with Tikosyn   She wanted to proceed with amiodarone She stopped  tikosyn x 3 days and then started amiodarone 200 mg bid x 7-10 days and then was reduced to 200 mg daily. Now she is on 100 mg daily for some spells of shortness of breath and  bradycardia.   Zio Monitor with pauses up to 5.8 secs  and bradycardia Will refer to Dr. Elberta Fortis with consideration for PPM  Chadsvasc score of at least 4, has had 2 significant nosebleeds that ENT was not  able to identify a source, had even with ASA and refused to go back on anticoagulation  Pt is back on  asa again, so far, few minor nosebleeds  Pt is clear in her decision, knowing stroke risk, that she will no longer take DOAC Tsh/cmet today    2. HTN Stable  Better sine adding amlodipine Continues lisinopril bid  Appointment with Dr. Elberta Fortis made for 03/22/21 I will see as needed    Elvina Sidle. [facility] [address] [place], [place] [address] [phone]

## 2021-03-19 DIAGNOSIS — R0602 Shortness of breath: Secondary | ICD-10-CM | POA: Diagnosis not present

## 2021-03-19 DIAGNOSIS — R531 Weakness: Secondary | ICD-10-CM | POA: Diagnosis not present

## 2021-03-19 DIAGNOSIS — I4891 Unspecified atrial fibrillation: Secondary | ICD-10-CM | POA: Diagnosis not present

## 2021-03-19 DIAGNOSIS — R000 Tachycardia, unspecified: Secondary | ICD-10-CM | POA: Diagnosis not present

## 2021-03-19 DIAGNOSIS — R001 Bradycardia, unspecified: Secondary | ICD-10-CM | POA: Diagnosis not present

## 2021-03-22 ENCOUNTER — Ambulatory Visit (INDEPENDENT_AMBULATORY_CARE_PROVIDER_SITE_OTHER): Payer: Medicare HMO | Admitting: Cardiology

## 2021-03-22 ENCOUNTER — Encounter: Payer: Self-pay | Admitting: Cardiology

## 2021-03-22 ENCOUNTER — Other Ambulatory Visit: Payer: Self-pay

## 2021-03-22 VITALS — BP 110/62 | HR 96 | Ht 66.0 in | Wt 122.0 lb

## 2021-03-22 DIAGNOSIS — Z01812 Encounter for preprocedural laboratory examination: Secondary | ICD-10-CM | POA: Diagnosis not present

## 2021-03-22 DIAGNOSIS — I4819 Other persistent atrial fibrillation: Secondary | ICD-10-CM

## 2021-03-22 MED ORDER — APIXABAN 2.5 MG PO TABS: 2.5000 mg | ORAL_TABLET | Freq: Two times a day (BID) | ORAL | 6 refills | Status: DC

## 2021-03-22 NOTE — Progress Notes (Signed)
Electrophysiology Office Note   Date:  03/22/2021   ID:  Amanda Olsen, DOB Apr 30, 1934, MRN [id number]  PCP:  Amanda Reichmann, DO  Cardiologist:  Amanda Olsen Primary Electrophysiologist:  Amanda Temples Jorja Loa, MD    No chief complaint on file.    History of Present Illness: Amanda Olsen is a 87 y.o. female who is being seen today for the evaluation of atrial fibrillation at the request of Amanda Reichmann, DO. Presenting today for electrophysiology evaluation.    She has a history significant for paroxysmal atrial fibrillation and hypertension.  She was initially on dofetilide but had more frequent episodes of atrial fibrillation and has now been switched to amiodarone.   Today, denies symptoms of palpitations, chest pain, shortness of breath, orthopnea, PND, lower extremity edema, claudication, dizziness, presyncope, syncope, bleeding, or neurologic sequela. The patient is tolerating medications without difficulties.  She unfortunately feels palpitations, fatigue, and shortness of breath.  Over this past weekend, she went into atrial fibrillation.  She brings in an ECG from EMS that shows heart rates in the 140s.  She is remained in atrial fibrillation since that time.  She feels quite poorly.  She previously would go in and out of atrial fibrillation, but she has been in this for the last 4 days.   Past Medical History:  Diagnosis Date  . Atrial fibrillation (HCC)    mostly in SR on Tikosyn  . Cataract   . HTN (hypertension)   . Rosacea    Past Surgical History:  Procedure Laterality Date  . VARICOSE VEIN SURGERY       Current Outpatient Medications  Medication Sig Dispense Refill  . amiodarone (PACERONE) 200 MG tablet Take 0.5 tablets (100 mg total) by mouth daily. 45 tablet 1  . amLODipine (NORVASC) 2.5 MG tablet Take 1 tablet (2.5 mg total) by mouth daily after lunch. 30 tablet 3  . apixaban (ELIQUIS) 2.5 MG TABS tablet Take 1 tablet (2.5 mg total) by mouth 2 (two) times  daily. 60 tablet 6  . Cholecalciferol (VITAMIN D-3) 25 MCG (1000 UT) CAPS Take 1,000 Units by mouth daily.     Marland Kitchen diltiazem (CARDIZEM) 30 MG tablet Take 1/2-1 tablet every 4 hours AS NEEDED for heart rate >100 45 tablet 1  . lisinopril (ZESTRIL) 20 MG tablet Take 1 tablet (20 mg total) by mouth 2 (two) times daily. 180 tablet 2  . Magnesium Oxide 400 MG CAPS Take 1 capsule (400 mg total) by mouth daily. Nature Made Brand  0  . Polyethyl Glycol-Propyl Glycol (SYSTANE HYDRATION PF OP) Place 1 drop into both eyes 3 (three) times daily as needed (for dryness).    . potassium chloride SA (KLOR-CON M20) 20 MEQ tablet Take 0.5 tablets (10 mEq total) by mouth 2 (two) times daily. (Patient taking differently: Take 20 mEq by mouth daily in the afternoon.) 90 tablet 2  . vitamin B-12 (CYANOCOBALAMIN) 100 MCG tablet Take 100 mcg by mouth daily.     No current facility-administered medications for this visit.    Allergies:   Other   Social History:  The patient  reports that she has never smoked. She has never used smokeless tobacco. She reports that she does not drink alcohol and does not use drugs.   Family History:  The patient's family history includes Heart Problems in her mother.   ROS:  Please see the history of present illness.   Otherwise, review of systems is positive for none.   All other systems  are reviewed and negative.   PHYSICAL EXAM: VS:  BP 110/62   Pulse 96   Ht 5\' 6"  (1.676 m)   Wt 122 lb (55.3 kg)   SpO2 99%   BMI 19.69 kg/m  , BMI Body mass index is 19.69 kg/m. GEN: Well nourished, well developed, in no acute distress  HEENT: normal  Neck: no JVD, carotid bruits, or masses Cardiac: Irregular; no murmurs, rubs, or gallops,no edema  Respiratory:  clear to auscultation bilaterally, normal work of breathing GI: soft, nontender, nondistended, + BS MS: no deformity or atrophy  Skin: warm and dry Neuro:  Strength and sensation are intact Psych: euthymic mood, full  affect  EKG:  EKG is ordered today. Personal review of the ekg ordered shows sinus rhythm, left bundle branch block, rate 50  Recent Labs: 01/09/2021: Hemoglobin 13.8; Magnesium 1.9; Platelets 184 03/17/2021: ALT 18; BUN 18; Creatinine, Ser 0.87; Potassium 4.0; Sodium 132; TSH 24.498    Lipid Panel     Component Value Date/Time   CHOL  07/02/2008 0425    130        ATP III CLASSIFICATION:  <200     mg/dL   Desirable  07/04/2008  mg/dL   Borderline High  106-269    mg/dL   High   TRIG 87 >=485 0425   HDL 42 07/02/2008 0425   CHOLHDL 3.1 07/02/2008 0425   VLDL 17 07/02/2008 0425   LDLCALC  07/02/2008 0425    71        Total Cholesterol/HDL:CHD Risk Coronary Heart Disease Risk Table                     Men   Women  1/2 Average Risk   3.4   3.3     Wt Readings from Last 3 Encounters:  03/22/21 122 lb (55.3 kg)  03/17/21 125 lb 3.2 oz (56.8 kg)  03/03/21 121 lb 3.2 oz (55 kg)      Other studies Reviewed: Additional studies/ records that were reviewed today include: TTE 07/02/17  Review of the above records today demonstrates:  - Left ventricle: Systolic function was mildly to moderately   reduced. The estimated ejection fraction was in the range of 40%   to 45%. Features are consistent with a pseudonormal left   ventricular filling pattern, with concomitant abnormal relaxation   and increased filling pressure (grade 2 diastolic dysfunction). - Ventricular septum: Septal motion showed paradox. These changes   are consistent with a left bundle branch block. - Left atrium: The atrium was mildly dilated. - Pulmonary arteries: Systolic pressure was mildly increased. PA   peak pressure: 39 mm Hg (S).  Cardiac monitor 03/18/2021 personally reviewed Max 182 bpm 06:16pm, 03/24 Min 29 bpm 10:55am, 03/29 Avg 50 bpm 1.5% supraventricular ectopy <1% ventricular ectopy 53 SVT runs, all less than 15 seconds 16 pauses, longest 5.8 seconds, 4.2 second pause in the evening hours,  longest pauses nocturnal No symptoms reported  ASSESSMENT AND PLAN:  1.  Paroxysmal atrial fibrillation: CHA2DS2-VASc of 4.  Has had significant nosebleeds and refuses anticoagulation.  Currently on amiodarone.  Unfortunately she is in atrial fibrillation.  We had a long discussion about what to do about her atrial fibrillation.  I told her that the only way to get back into normal rhythm is for anticoagulation.  This could certainly be short-term, but she would need this to qualify for cardioversion.  Due to that, we Ioane Bhola start her on Eliquis today.  We Zorina Mallin plan for TEE and cardioversion.  She Maddelynn Moosman need Eliquis for at least 3 weeks after cardioversion.  We Shadonna Benedick have her follow-up in A. fib clinic to determine whether or not it is worth continuing Eliquis.  We Karrin Eisenmenger also potentially plan to refer for watchman.  Unfortunately, she has been unable to tolerate dofetilide, and may be failing amiodarone.  A rate control strategy may be warranted eventually.  This patients CHA2DS2-VASc Score and unadjusted Ischemic Stroke Rate (% per year) is equal to 4.8 % stroke rate/year from a score of 4  Above score calculated as 1 point each if present [CHF, HTN, DM, Vascular=MI/PAD/Aortic Plaque, Age if 65-74, or Female] Above score calculated as 2 points each if present [Age > 75, or Stroke/TIA/TE]   2.  Hypertension: Currently well controlled  3.  Hypothyroidism: We Geno Sydnor need to see primary care to start Synthroid.  4.  Sick sinus syndrome: Found on cardiac monitor with significant pauses.  She Lian Tanori potentially need pacemaker implant in the future, though this was not part of the discussion today.  We Azekiel Cremer discuss at her next visit.  Current medicines are reviewed at length with the patient today.   The patient does not have concerns regarding her medicines.  The following changes were made today: none  Labs/ tests ordered today include:  Orders Placed This Encounter  Procedures  . CBC      Disposition:   FU with Meredith Mells 3 months  Signed, Uldine Fuster Jorja Loa, MD  03/22/2021 4:16 PM     [facility] [address] [place] [address] [phone] (office) [phone] (fax)

## 2021-03-22 NOTE — Patient Instructions (Addendum)
Medication Instructions:  Your physician has recommended you make the following change in your medication:  1. START Eliquis 2.5 mg twice a day  *If you need a refill on your cardiac medications before your next appointment, please call your pharmacy*   Lab Work: CBC today If you have labs (blood work) drawn today and your tests are completely normal, you will receive your results only by: Marland Kitchen MyChart Message (if you have MyChart) OR . A paper copy in the mail If you have any lab test that is abnormal or we need to change your treatment, we will call you to review the results.   Testing/Procedures: Your physician has requested that you have a TEE/Cardioversion. During a TEE, sound waves are used to create images of your heart. It provides your doctor with information about the size and shape of your heart and how well your heart's chambers and valves are working. In this test, a transducer is attached to the end of a flexible tube that is guided down you throat and into your esophagus (the tube leading from your mouth to your stomach) to get a more detailed image of your heart. Once the TEE has determined that a blood clot is not present, the cardioversion begins. Electrical Cardioversion uses a jolt of electricity to your heart either through paddles or wired patches attached to your chest. This is a controlled, usually prescheduled, procedure. This procedure is done at the hospital and you are not awake during the procedure. You usually go home the day of the procedure. Please see the instruction sheet below under "other instructions"    Follow-Up: At [facility], you and your health needs are our priority.  As part of our continuing mission to provide you with exceptional heart care, we have created designated Provider Care Teams.  These Care Teams include your primary Cardiologist (physician) and Advanced Practice Providers (APPs -  Physician Assistants and Nurse Practitioners) who all work  together to provide you with the care you need, when you need it.   Your next appointment:   3 week(s) after your TEE/Cardioversion  The format for your next appointment:   In Person  Provider:   You will follow up in the At[facility] Fibrillation Clinic located at [facility]. Your provider will be: Rudi Coco, NP or Clint R. Fenton, PA-C    Thank you for choosing [facility]!!   Dory Horn, RN [phone]   Other Instructions   COVID TEST-- On 03/29/2021 @ 10:00 am - You will go to [address] [place], [place] for your Covid testing.   This is a drive thru test site, stay in your car and the nurse team will come to your car to test you.  After you are tested please go home and self quarantine until the day of your procedure.     You are scheduled for a TEE/Cardioversion on 03/31/2021 with Dr. Mayford Knife.  Please arrive at the [facility] (Main Entrance A) at [facility]: [address] [place], [place] [address] at 8:00 am am/pm.   DIET: Nothing to eat or drink after midnight except a sip of water with medications (see medication instructions below)  Medication Instructions: Hold all your medications the morning of this procedure  Continue your anticoagulant: Eliquis You will need to continue your anticoagulant after your procedure until you are told by your provider that it is safe to stop   Labs: CBC today   You must have a responsible person  to drive you home and stay in the waiting area during your procedure. Failure to do so could result in cancellation.  Bring your insurance cards.  *Special Note: Every effort is made to have your procedure done on time. Occasionally there are emergencies that occur at the hospital that may cause delays. Please be patient if a delay does occur.        [employer]'s Heart Disease: A Textbook of Cardiovascular Medicine (11th ed., pp. 010-272). Elsevier.">  ?????????????? ??????????????? Transesophageal  Echocardiogram ?????????????? ??????????????? (???)-- ??? ????????????, ??????????? ???????? ? ??????? ???????? ???? ????? ??????, ????????? ??????????? ?????? ? ???????, ??????? ??????? ?? ?????? ? ???????????? ? ?????? (???????????????). ??? ?????????? ????? ???????? ?????????? ??????????????? ?????, ?????????????? ? ?????? ??????, ????? ??? ? ????? ? ???????. ?????? ??????????? ??????? ?? ????????. ??? ????? ???? ?????????:  ????? ?????????, ????????? ?????? ????????????? ??????? ??????.  ????? ????????? ??????? ?????-???? ?????????? ??????????? ??? ????????.  ????? ????????? ??????? ???????? ?????.  ??? ???????? ?? ??????? ??????? ???????? ?????????? ???????? ?????? (??????????).  ??? ?????? ?????????????? ?????? (???????????) ?????? ? ???????? ?? ??????? ??????? ?????????, ??????? ?? ????????? ????? ???????? (????????? ????????? ????????? ??? ??????? ?????????????? ???????????).  ??? ??????????? ???????? ? ??????? ??????????? ??????? (?????????? ?????).  ??? ?????? ?????, ???????? ? ???????????????? ??????. ????? ????????? ????? ???? ??????? ? ????????????? ???????????, ??????? ????????? ???????????????, ????????? ? ??????????? ??????? ?????? (???).  ?? ????? ???????? ?? ????????? ????????. ??? ????????? ??????? ??????? ???????? ?????????????? ??????? ????? ????????? ???????? ???????.  ?? ????? ???? ?????? ???????????????? ???????? ??? ???????? ???????????? ?????????.  ??? ???????????? ??????? ?????? ?? ????????????? ?????????, ??????? ???????? ??????????? ??? ??????? ?????????.  ??? ?????? ????????? ?????? ?????? ?? ??????? ??????? ????????? ???? ?????? ??????????. ??? ???????, ??? ????????? ?????????????. ??? ????? ?????????? ???????? ??? ?????????? ???? ?????????. ?? ?????? ???????????, ??? ???? ??????????? ? ?????? ?????? ??????. ???? ?? ?????? ? ?????? ? ?? ?????? ?? ???????. ???????? ???????? ????? ?:  ????? ????????? ? ??? ????????????? ????????.  ???? ???????????  ????????????? ?????????, ??????? ????????, ???????????? ?????????, ??????? ?????, ????? ? ????????????? ????????, ??????????? ??? ???????.  ??? ???? ????????? ? ??????????? ?????????????? ???????, ??????? ????? ? ??? ??? ?????? ????? ?????.  ????? ????????? ???????????? ?????.  ????? ????? ???????????? ?????????.  ????? ????????? ????????????.  ? ??????? ????? ??????????? ??? ????????.  ? ???, ??? ? ??? ???? ??? ??????? ? ????????? ?????? ????????? (??????????????) ????????.  ???, ??? ?? ????????? ??? ???????? ?????????. ?????? ?????? ??? ???????, ??? ?????????? ?????????. ?????? ????? ?????????? ??????????, ? ??? ?????:  ??????????? ??????????? ???????? ??? ???????.  ?????? ????????.  ???????????? ????????????.  ??????? ????? ??? ??????????? ??? ????????.  ????????????. ??? ?????????? ????? ??????????? ????????????? ????????? ???????? ? ?????? ???????? ????? ?:  ????????? ??? ?????? ?????? ????? ??????? ????????????? ??????????. ??? ???????? ?????, ???? ?? ?????????? ???????????????????? ????????? ??? ????????? ??? ?????????? ?????.  ?????? ????? ??????????, ??? ??????? ? ?????????. ??? ????????? ????? ????????? ???? ?????. ?? ?????????? ??? ????????????? ????????? ??? ???????????????? ???????? ?????? ???????? ?????.  ?????? ????????????? ???????, ??????????? ??? ???????, ?????????, ???????????? ?????????? ? ??????? ???????. ????? ????????  ?????????? ??????????? ?????? ???????? ????? ? ????????? ??????????? ??? ??? ?????.  ??? ????? ????? ????? ??? ?????? ??????? ? ??????? ???????? ?????? ????????.  ???????????? ? ????????????? ????????, ????? ?? ????? ??? ????? ?? ???????? ??? ???????.  ????????????, ????? ????????????? ???????? ??????????? ? ??? ? ??????? ?????????? ??????? ????? ?????? ????? ?? ???????? ??? ???????. ??? ?????. ??? ?????????? ?? ????? ??????????  ? ???? ?? ????? ??? ????? ??????????? ??????????.  ??? ????? ???? ??? ????????? ??  ??????????: ? ?????????, ??????? ??????? ??? ??????????? (?????????? ????????). ? ????????????? ????????, ??????? ??????????? ??? ??????? ????????? ?????, ????? ?????? ????? ?????? ??????? ???????????????? (??????? ?????????).  ?? ????? ????????? ????? ???????????????? ???? ???????????? ????????, ??????? ????????? ?????????? ? ???????.  ??? ????? ????????? ???? ?? ????? ???.  ??? ? ??? ???????? ????????, ????? ?? ?? ?????? ?????? ?????? ?? ????? ?????????.  ? ?????? ????? ??????? ??????? ???????? ????. ??? ???????? ?????????. ??? ???????? ?????????? ?????????? ????? ? ???????.  ??? ?????? ?????? ????? ???????? ? ?????? ???????, ???? ?????? ???????? ???????? ????? ? ?????? ?????? ? ???????? ???????????? ???-???????. ??? ???-??????? ????????????? ? ???????????, ??????? ???????????? ?? ?????????????.  ????? ??????????? ???????? ???????? ?????? ?????? ?????? ??????, ???? ? ???????? ????????. ?????? ????? ? ?????? ????????? ????? ????????? ??? ????????? ??-???????.   ??? ????? ??????? ????? ?????? ??????????  ??? ????? ???????? ???????????? ????????, ??????? ????????? ??????????, ??????? ??????? ? ??????? ????????? ? ?????, ???? ?? ?? ???????? ???????? ??? ???????.  ????? ?? ????????, ?? ?????? ??????? ?????????????? ???? ? ?????, ??????? ??-???????? ????? ?????????? ?????????. ???? ????????? ?????????? ?????????????. ??? ?????? ????? ???? ? ???? ?? ??? ???, ???? ?? ??????? ???????? ?????, ????? ?? ?? ??????????.  ????? ????????? ?????? ?????????? ???? ? ????? ? ??????? ?????? ??? ???? ????.  ?? ?????? ???? ???????????? ? ???, ????? ???????? ?????????? ???? ?????????. ???????? ? ?????? ???????? ????? ??? ? ?????????, ??? ??????????? ?????????, ????? ????? ?????? ???? ??????????. ? ???????? ???????? ???????? ????? ????????????:  ???? ?? ????? ????????? ??? ???? ??????????????, ??? ????? ??????????? ? ??????? ?????????? ?????.  He ?????? ?????????? ? ?? ?????????? ???????? ?? ??? ???, ???? ???????  ???? ?? ??????, ??? ??? ?????????.  ????????????? ? ????? ???????????? ????? ??? ??????? ????? ??????? ??????. ???????? ? ?????? ???????? ?????, ????? ???? ???????? ??????????? ??? ???.  ????????? ?? ??? ?????? ???????????? ??????????. ??? ?????. ??????  ?????????????? ??????????????? (???)-- ??? ????????????, ??????????? ???????? ????? ?????? ? ????????? ??????????? ?????? (???????????????) ??? ?????? ?????????????? ????.  ??? ?????????? ????? ???????? ?????????? ??????????????? ?????, ?????????????? ? ?????? ?????? ? ???????.  ??? ???????, ??? ?????????? ?????????. ??? ?? ?????, ????? ?????????? ??????????, ? ??? ????? ??????????? ??????????? ???????? ??? ???????, ????????????, ???????????? ????????????, ????????? ?????? ??? ????????? ????????.  ???????? ?????? ???????? ????? ? ????? ????????????? ??????????, ??????? ?? ??????????, ? ? ????? ????????? ? ??? ??????????? ??????????, ??????? ??????????? ??? ????????. ??? ?????????? ?? ????? ???????? ??????, ??????????????? ????? ??????. ??????????? ???????? ????? ???????????? ??? ??????? ? ????? ??????? ??????. Document Revised: 09/22/2020 Document Reviewed: 08/17/2020 Elsevier Patient Education  2021 [employer]vier Inc[employer]     ????????????? ???????????? Electrical Cardioversion ????????????? ???????????? -- ??? ????????? ????? ????????????? ???????? ??? ?????????????? ??????????? ????? ??????. ??????? ??????? ??? ???????????? ???? ?????? ?? ????????? ??? ??????? ??????? ???????????? ?????. ?? ????? ???? ????????? ??? ????????? ?????????????? ???????, ????????????? ??????????? ? ??????, ?? ????? ????????????? ???????? ?? ???????? ??? ????????????? ????????. ?????? ????????? ????? ????????? ? ?????? ???????????? ????????, ????:  ? ?????????? ????? ?????? ?????????? ?????? ??? ??????? ???????????? ????????.  ????? ???????? ???????? ???? ? ?????? ?? ?????????? ???????????, ?????????? ???? ?????? ???? ???????????? ??? ????? ???????.  ??? ?????  ?????? ?????. ??? ????? ????? ???? ???????? ????????? ?? ??????? ????????? ????? ?????? ??? ?????????? ????????????, ??????? ??????????????? ????? ?? ????????. ???????? ???????? ????? ?:  ????? ????????? ?????????.  ???? ??????????? ??????????, ??????? ????????, ?????????????, ??????? ?????, ????? ? ????????????? ????????, ??????????? ??? ???????.  ????? ????????? ? ??????????? ??????????????, ??????? ????? ? ??? ??? ?????? ????? ?????.  ????? ????????? ???????????? ?????.  ????? ????? ???????????? ?????????.  ????? ????????? ????????????.  ???, ????????? ?? ?? ??? ?????? ????????????. ?????? ?????? ??? ???????, ??? ?????????? ?????????. ?????? ????? ?????????? ??????????, ? ??? ?????:  ????????????? ??????? ?? ????????????? ?????????.  ????? ????????? ???????, ??????? ????? ????????????? ? ?????? ??????? ?????????.  ????????? ??????? ????????? ????? ?????? ? ??????? ????? ??? ???? ????? ?????????.  ????????? ?????? (????????? ????????? ????????????). ??????????? ?????. ??? ?????????? ????? ??????????? ????????????? ?????????  ??????? ???? ????? ????????? ??? ?????: ? ??????????? ????? ???????? (???????????????), ????? ????? ?? ????? ?????? ????????????. ? ???????? ??? ???????????? ??????? ????????? ?????????? ? ?????.  ???????? ? ?????? ???????? ????? ?: ? ????????? ??? ?????? ?????? ????? ??????? ????????. ??? ???????? ?????, ???? ?? ?????????? ???????????????????? ????????? ??? ????????? ??? ?????????? ?????. ? ?????? ????? ????????, ??? ??????? ? ?????????. ??? ????????? ????? ????????? ???? ?????. ?? ?????????? ??? ????????????? ????????? ??? ???????????????? ???????? ?????? ???????? ?????. ? ?????? ????????????? ???????, ??????????? ??? ???????, ?????????, ????????????? ???? ? ??????? ???????. ????? ????????  ?????????? ??????????? ?????? ???????? ????? ? ????????? ??????????? ??? ??? ?????.  ????????????, ????? ???-?? ?????? ??? ????? ?? ???????? ??? ???????.  ????  ?? ??????????? ????? ????? ????? ?????????, ????????????, ????? ???-?? ????? ? ???? ? ??????? 24 ?????.  ???????? ? ?????? ???????? ?????, ????? ????? ??????????? ????, ????? ?? ????????? ????????. ? ??? ????? ?????????? ??????? ???? ????????????????? ?????. ??? ?????????? ?? ????? ??????????  ? ???? ?? ????? ??? ????? ??????????? ??????????.  ??? ?? ????? ????? ?????????? ??????? ??????????? ???????? (?????????) ??? ????????????? ?????????.  ????? ?? ????????????, ??? ????? ???? ????????????? ???????? (?????????? ????????).  ????? ??????? ????????????? ??????. ?????? ????? ? ?????? ????????? ????? ????????? ??? ????????? ??-???????.   ??? ????? ??????? ????? ?????? ??????????  ??? ????? ???????? ???????????? ????????, ??????? ????????? ??????????, ??????? ??????? ? ??????? ????????? ? ?????, ???? ??? ?? ??????? ?? ???????? ??? ???????.  ????? ??????? ?? ????? ?????? ??????, ????? ????????? ? ???, ??? ?? ??????????.  ? ??? ????? ????????? ????????? ??????????? ?? ???? ? ?????, ???? ?????????? ????????????? ???????. ? ???????? ???????? ???????? ????? ????????????:  ?? ?????? ?????????? ? ???????  24 ?????, ???? ??? ?? ????? ????????? ???? ?????????? ????????.  ?????????? ?????????????? ? ??????????? ????????????? ???????? ?????? ? ???????????? ? ?????????? ?????? ???????? ?????.  ???????? ? ?????? ???????? ?????, ??? ????????? ?????. ?????????? ??? ?????.  ????????? ? ??????? 48????? ????? ????????? ??? ??? ??????? ????? ??????? ??????.  ????????? ??? ?????????? ???????????? ??????? ? ???????????? ? ?????????? ?????? ???????? ?????.  ????????? ?? ??? ?????? ???????????? ??????????, ??? ??????? ????? ??????? ??????. ??? ?????. ?????????? ? ???????? ?????, ????:  ?? ??????????, ??? ?????? ?????? ??????? ?????? ??? ???? ? ??? ???????????? ?????.  ? ??? ????????? ??????? ???????? ????????, ??????? ?? ????????. ?????????? ?????????? ?? ???????, ????:  ? ??? ????????? ??????????  ???????? ? ?????.  ?? ?????????? ?????????????? ??? ?????????????? ?????????.  ??? ?????? ?????? ??? ? ??? ??????.  ? ??? ????????? ????.  ??? ?????? ??????? ????? ??? ????? ? ????? ??????? ????.  ???? ?????? ??? ??? ??? ????? ????????? ??? ????????. ??????  ????????????? ???????????? -- ??? ????????? ????? ????????????? ???????? ??? ?????????????? ??????????? ????? ??????.  ??? ????????? ????? ???? ????????? ?????????? ? ?????????? ???????? ??? ????? ???? ???????????????, ???? ??????????? ????????? ?? ???????? ??????????.  ??? ???????, ??? ?????????? ?????????.  ????? ?????????? ????? ????? ????????? ??? ??????? ????? ??????? ??????. ??? ?????????? ?? ????? ???????? ??????, ??????????????? ????? ??????. ??????????? ???????? ????? ???????????? ??? ??????? ? ????? ??????? ??????. Document Revised: 08/14/2019 Document Reviewed: 08/14/2019 Elsevier Patient Education  2021 [employer].

## 2021-03-23 ENCOUNTER — Ambulatory Visit (HOSPITAL_COMMUNITY): Payer: Medicare HMO | Admitting: Nurse Practitioner

## 2021-03-23 LAB — CBC
Hematocrit: 41.2 % (ref 34.0–46.6)
Hemoglobin: 13.1 g/dL (ref 11.1–15.9)
MCH: 28.9 pg (ref 26.6–33.0)
MCHC: 31.8 g/dL (ref 31.5–35.7)
MCV: 91 fL (ref 79–97)
Platelets: 168 10*3/uL (ref 150–450)
RBC: 4.53 x10E6/uL (ref 3.77–5.28)
RDW: 12.7 % (ref 11.7–15.4)
WBC: 5.9 10*3/uL (ref 3.4–10.8)

## 2021-03-24 ENCOUNTER — Telehealth: Payer: Self-pay | Admitting: Cardiology

## 2021-03-24 NOTE — Telephone Encounter (Signed)
Advised to call Monday and update [place] before cancelling. Aware pt may have issues again. Aware can discuss Monday w/ Dr. Elberta Fortis if need be. Dtr agreeable and will call Monday morning with an update.

## 2021-03-24 NOTE — Telephone Encounter (Signed)
Patient's daughter in law states the patient has returned to her normal state and she is no longer having issues with afib. She would like to know if she still needs to have a cardioversion. Please advise.

## 2021-03-28 NOTE — Telephone Encounter (Signed)
Patient's daughter in law is following up. She states the patient is still out of afib. She is hoping the patient will not have to have the procedure and she would like to know what her next steps are. Will she still need to follow up with the afib clinic?

## 2021-03-28 NOTE — Telephone Encounter (Signed)
Returned call to dtr. Discussed w/ Dr. Elberta Fortis. Cancelled  covid screening tomorrow and TEE/DCCV scheduled for 4/21. Advised to call if AFib returns. Advised to keep follow up appt w/ the AFib clinic 5/17. Dtr verbalized understanding and agreeable to plan.

## 2021-03-29 ENCOUNTER — Other Ambulatory Visit (HOSPITAL_COMMUNITY): Payer: Medicare HMO

## 2021-03-31 ENCOUNTER — Ambulatory Visit (HOSPITAL_COMMUNITY): Admission: RE | Admit: 2021-03-31 | Payer: Medicare HMO | Source: Home / Self Care | Admitting: Cardiology

## 2021-03-31 ENCOUNTER — Encounter (HOSPITAL_COMMUNITY): Admission: RE | Payer: Self-pay | Source: Home / Self Care

## 2021-03-31 SURGERY — ECHOCARDIOGRAM, TRANSESOPHAGEAL
Anesthesia: General

## 2021-04-07 ENCOUNTER — Other Ambulatory Visit (HOSPITAL_COMMUNITY): Payer: Self-pay | Admitting: Nurse Practitioner

## 2021-04-12 ENCOUNTER — Ambulatory Visit (INDEPENDENT_AMBULATORY_CARE_PROVIDER_SITE_OTHER): Payer: Medicare HMO | Admitting: Cardiology

## 2021-04-12 ENCOUNTER — Encounter: Payer: Self-pay | Admitting: Cardiology

## 2021-04-12 ENCOUNTER — Other Ambulatory Visit: Payer: Self-pay

## 2021-04-12 VITALS — BP 122/76 | HR 120 | Ht 66.0 in | Wt 123.6 lb

## 2021-04-12 DIAGNOSIS — Z01812 Encounter for preprocedural laboratory examination: Secondary | ICD-10-CM | POA: Diagnosis not present

## 2021-04-12 DIAGNOSIS — I495 Sick sinus syndrome: Secondary | ICD-10-CM | POA: Diagnosis not present

## 2021-04-12 DIAGNOSIS — I4819 Other persistent atrial fibrillation: Secondary | ICD-10-CM | POA: Diagnosis not present

## 2021-04-12 MED ORDER — DILTIAZEM HCL ER COATED BEADS 120 MG PO CP24: 120.0000 mg | ORAL_CAPSULE | Freq: Every day | ORAL | 3 refills | Status: DC

## 2021-04-12 NOTE — Progress Notes (Addendum)
Electrophysiology Office Note   Date:  04/12/2021   ID:  Amanda Olsen, DOB Jul 03, 1934, MRN [id number]  PCP:  [name], DO  Cardiologist:  Eden Emms Primary Electrophysiologist:  Codie Krogh Jorja Loa, MD    No chief complaint on file.    History of Present Illness: Amanda Olsen is a 87 y.o. female who is being seen today for the evaluation of atrial fibrillation at the request of [name], DO. Presenting today for electrophysiology evaluation.    She has a history significant for persistent atrial fibrillation, hypertension.  She was initially on dofetilide but had more episodes and has been switched to amiodarone.  She wore a cardiac monitor that showed significant bradycardia due to sick sinus syndrome.  Today, denies symptoms of palpitations, chest pain, shortness of breath, orthopnea, PND, lower extremity edema, claudication, dizziness, presyncope, syncope, bleeding, or neurologic sequela. The patient is tolerating medications without difficulties.  She is unfortunately gone back into atrial fibrillation.  Her heart rates are quite rapid today.  She has had significant bradycardia as well.  At this point, she would benefit from pacemaker implant.  She understands that pacemaker would be indicated.  She has fatigue, shortness of breath, weakness.   Past Medical History:  Diagnosis Date  . Atrial fibrillation (HCC)    mostly in SR on Tikosyn  . Cataract   . HTN (hypertension)   . Rosacea    Past Surgical History:  Procedure Laterality Date  . VARICOSE VEIN SURGERY       Current Outpatient Medications  Medication Sig Dispense Refill  . amiodarone (PACERONE) 200 MG tablet Take 0.5 tablets (100 mg total) by mouth daily. 45 tablet 1  . amLODipine (NORVASC) 2.5 MG tablet Take 1 tablet (2.5 mg total) by mouth daily after lunch. 30 tablet 3  . apixaban (ELIQUIS) 2.5 MG TABS tablet Take 1 tablet (2.5 mg total) by mouth 2 (two) times daily. 60 tablet 6  .  Cholecalciferol (VITAMIN D-3) 25 MCG (1000 UT) CAPS Take 1,000 Units by mouth daily.     Marland Kitchen diltiazem (CARDIZEM CD) 120 MG 24 hr capsule Take 1 capsule (120 mg total) by mouth daily. 30 capsule 3  . diltiazem (CARDIZEM) 30 MG tablet Take 1/2-1 tablet every 4 hours AS NEEDED for heart rate >100 45 tablet 1  . lisinopril (ZESTRIL) 20 MG tablet Take 1 tablet (20 mg total) by mouth 2 (two) times daily. 180 tablet 2  . Magnesium Oxide 400 MG CAPS Take 1 capsule (400 mg total) by mouth daily. Nature Made Brand  0  . Polyethyl Glycol-Propyl Glycol (SYSTANE HYDRATION PF OP) Place 1 drop into both eyes 3 (three) times daily as needed (for dryness).    . potassium chloride SA (KLOR-CON M20) 20 MEQ tablet Take 0.5 tablets (10 mEq total) by mouth 2 (two) times daily. 90 tablet 2  . vitamin B-12 (CYANOCOBALAMIN) 100 MCG tablet Take 100 mcg by mouth daily.     No current facility-administered medications for this visit.    Allergies:   Other, Grapefruit oil, and Strawberry flavor   Social History:  The patient  reports that she has never smoked. She has never used smokeless tobacco. She reports that she does not drink alcohol and does not use drugs.   Family History:  The patient's family history includes Heart Problems in her mother.   ROS:  Please see the history of present illness.   Otherwise, review of systems is positive for none.   All  other systems are reviewed and negative.   PHYSICAL EXAM: VS:  BP 122/76   Pulse (!) 120   Ht 5\' 6"  (1.676 m)   Wt 123 lb 9.6 oz (56.1 kg)   SpO2 97%   BMI 19.95 kg/m  , BMI Body mass index is 19.95 kg/m. GEN: Well nourished, well developed, in no acute distress  HEENT: normal  Neck: no JVD, carotid bruits, or masses Cardiac: Irregular; no murmurs, rubs, or gallops,no edema  Respiratory:  clear to auscultation bilaterally, normal work of breathing GI: soft, nontender, nondistended, + BS MS: no deformity or atrophy  Skin: warm and dry Neuro:  Strength and  sensation are intact Psych: euthymic mood, full affect  EKG:  EKG is not ordered today. Personal review of the ekg ordered 4/*7/22 shows sinus rhythm, rate 50  Recent Labs: 01/09/2021: Magnesium 1.9 03/17/2021: ALT 18; BUN 18; Creatinine, Ser 0.87; Potassium 4.0; Sodium 132; TSH 24.498 03/22/2021: Hemoglobin 13.1; Platelets 168    Lipid Panel     Component Value Date/Time   CHOL  07/02/2008 0425    130        ATP III CLASSIFICATION:  <200     mg/dL   Desirable  07/04/2008  mg/dL   Borderline High  161-096    mg/dL   High   TRIG 87 >=045 0425   HDL 42 07/02/2008 0425   CHOLHDL 3.1 07/02/2008 0425   VLDL 17 07/02/2008 0425   LDLCALC  07/02/2008 0425    71        Total Cholesterol/HDL:CHD Risk Coronary Heart Disease Risk Table                     Men   Women  1/2 Average Risk   3.4   3.3     Wt Readings from Last 3 Encounters:  04/12/21 123 lb 9.6 oz (56.1 kg)  03/22/21 122 lb (55.3 kg)  03/17/21 125 lb 3.2 oz (56.8 kg)      Other studies Reviewed: Additional studies/ records that were reviewed today include: TTE 05/05/2020 Review of the above records today demonstrates:  1. Left ventricular ejection fraction, by estimation, is 50 to 55%. The  left ventricle has low normal function. The left ventricle has no regional  wall motion abnormalities. Left ventricular diastolic parameters are  consistent with Grade I diastolic  dysfunction (impaired relaxation).  2. Right ventricular systolic function is normal. The right ventricular  size is normal. There is normal pulmonary artery systolic pressure. The  estimated right ventricular systolic pressure is 19.6 mmHg.  3. The mitral valve is grossly normal. No evidence of mitral valve  regurgitation. No evidence of mitral stenosis.  4. The aortic valve is tricuspid. Aortic valve regurgitation is not  visualized. Mild aortic valve sclerosis is present, with no evidence of  aortic valve stenosis.  5. The inferior vena cava  is normal in size with greater than 50%  respiratory variability, suggesting right atrial pressure of 3 mmHg.   Cardiac monitor 03/18/2021 personally reviewed Max 182 bpm 06:16pm, 03/24 Min 29 bpm 10:55am, 03/29 Avg 50 bpm 1.5% supraventricular ectopy <1% ventricular ectopy 53 SVT runs, all less than 15 seconds 16 pauses, longest 5.8 seconds, 4.2 second pause in the evening hours, longest pauses nocturnal No symptoms reported  ASSESSMENT AND PLAN:  1.  Paroxysmal atrial fibrillation: CHA2DS2-VASc of 4.  Currently on amiodarone.  Unfortunately she has had more episodes of atrial fibrillation.  She is also on Eliquis.  She has had nosebleeds in the past, and I am concerned that if she has more nosebleeds, that she Samona Chihuahua stop taking her anticoagulation.     2.  Hypertension: Currently well controlled  3.  Hypothyroidism: Primary care following.  Currently on Synthroid.  4.  Sick sinus syndrome: She has multiple episodes of atrial fibrillation with rapid rates as well as bradycardia.  At this point, pacemaker implant would be indicated.  Risks and benefits were discussed which include bleeding, tamponade, infection, pneumothorax.  She understands these risks and has agreed to the procedure.    Current medicines are reviewed at length with the patient today.   The patient does not have concerns regarding her medicines.  The following changes were made today: Start diltiazem  Labs/ tests ordered today include:  Orders Placed This Encounter  Procedures  . CBC  . Basic metabolic panel     Disposition:   FU with Marija Calamari 3 months  Signed, Erroll Wilbourne Jorja Loa, MD  04/12/2021 4:27 PM     [facility] [address] [place] [address] [phone] (office) [phone] (fax)

## 2021-04-12 NOTE — Addendum Note (Signed)
Addended by: Baird Lyons on: 04/12/2021 04:18 PM   Modules accepted: Orders

## 2021-04-12 NOTE — Patient Instructions (Addendum)
Medication Instructions:  Your physician recommends that you continue on your current medications as directed. Please refer to the Current Medication list given to you today.     * If you need a refill on your cardiac medications before your next appointment, please call your pharmacy. *   Labwork: Pre procedure lab work 6/2: BMET & CBC w/ diff If you have labs (blood work) drawn today and your tests are completely normal, you will receive your results only by:  MyChart Message (if you have MyChart) OR  A paper copy in the mail If you have any lab test that is abnormal or we need to change your treatment, we will call you to review the results.   Testing/Procedures: Your physician has recommended that you have a pacemaker inserted. A pacemaker is a small device that is placed under the skin of your chest or abdomen to help control abnormal heart rhythms. This device uses electrical pulses to prompt the heart to beat at a normal rate. Pacemakers are used to treat heart rhythms that are too slow. Wire (leads) are attached to the pacemaker that goes into the chambers of you heart. This is done in the hospital and usually requires and overnight stay.  Please follow the instructions below, located under the special instructions section.   Follow-Up: Your physician recommends that you schedule a wound check appointment 10-14 days, after your procedure on 05/13/21, with the device clinic.  Your physician recommends that you schedule a follow up appointment in 91 days, after your procedure on 05/13/21, with Dr. Elberta Fortis.  Thank you for choosing [facility]!!   Dory Horn, RN [phone]   Any Other Special Instructions Will Be Listed Below (If Applicable).    Implantable Device Instructions  You are scheduled for: Permanent pacemaker implant on 05/13/21 with Dr. Elberta Fortis.  1.   Pre procedure testing-             A.  LAB WORK--- On 05/12/2021 @ 9:15 am (before your covid screening)  for  your pre procedure blood work.  You do NOT need to be fasting.              B. COVID TEST-- On 05/12/2021 @ 10:00 am (after your blood work)  - This is a Charity fundraiser at [address] [place], [place] [address].  Someone will direct you to the appropriate testing line. Stay in your car and someone will be with you shortly.   After you are tested please go home and self quarantine until the day of your procedure.    2. On the day of your procedure 05/13/2021 you will go to [facility] [phone]) at 8:30am.  You will go to the main entrance [address] [employer]) and enter where the [employer] parking staff are.  You will check in at ADMITTING.  You may have one support person come in to the hospital with you.  They will be asked to wait in the waiting room.   3.   Do not eat or drink after midnight prior to your procedure.   4.   On the morning of your procedure do NOT take any medication.  5.  The night before your procedure and the morning of your procedure scrub your neck/chest with surgical scrub.  See instruction letter.   5.  Plan for an overnight stay, but you may be discharged home after your procedure. If you use your phone frequently bring your phone  charger, in case you have to stay.  If you are discharged after your procedure you will need someone to drive you home and be with your for 24 hours after your procedure.   6.  [facility] 10-14 days after your procedure. You will follow up with Dr. Elberta Fortisamnitz 91 days after your procedure.  These appointments will be made for you.   * If you have ANY questions after you get home, please call the office [phone] and ask for Sameria Morss RN or send a MyChart message.   [facility] - Preparing For Surgery (surgical scrub)   Before surgery, you can play an important role. Because skin is not sterile, your skin needs to be as free of germs as possible. You can reduce the number of germs on your skin  by washing with CHG (chlorahexidine gluconate) Soap before surgery.  CHG is an antiseptic cleaner which kills germs and bonds with the skin to continue killing germs even after washing.   Please do not use if you have an allergy to CHG or antibacterial soaps.  If your skin becomes reddened/irritated stop using the CHG.   Do not shave (including legs and underarms) for at least 48 hours prior to first CHG shower.  It is OK to shave your face.  Please follow these instructions carefully:  1.  Shower the night before surgery and the morning of surgery with CHG.  2.  If you choose to wash your hair, wash your hair first as usual with your normal shampoo.  3.  After you shampoo, rinse your hair and body thoroughly to remove the shampoo.  4.  Use CHG as you would any other liquid soap.  You can apply CHG directly to the skin and wash gently with a clean washcloth. 5.  Apply the CHG Soap to your body ONLY FROM THE NECK DOWN.  Do not use on open wounds or open sores.  Avoid contact with your eyes, ears, mouth and genitals (private parts).  Wash genitals (private parts) with your normal soap.  6.  Wash thoroughly, paying special attention to the area where your surgery will be performed.  7.  Thoroughly rinse your body with warm water from the neck down.   8.  DO NOT shower/wash with your normal soap after using and rinsing off the CHG soap.  9.  Pat yourself dry with a clean towel.           10.  Wear clean pajamas.           11.  Place clean sheets on your bed the night of your first shower and do not sleep with pets.  Day of Surgery: Do not apply any deodorants/lotions.  Please wear clean clothes to the hospital/surgery center.     Pacemaker Implantation, Adult Pacemaker implantation is a procedure to place a pacemaker inside the chest. A pacemaker is a small computer that sends electrical signals to the heart and helps the heart beat normally. A pacemaker also stores information about heart  rhythms. You may need pacemaker implantation if you have:  A slow heartbeat (bradycardia).  Loss of consciousness that happens repeatedly (syncope) or repeated episodes of dizziness or light-headedness because of an irregular heart rate.  Shortness of breath (dyspnea) due to heart problems. The pacemaker usually attaches to your heart through a wire called a lead. One or two leads may be needed. There are different types of pacemakers:  Transvenous pacemaker. This type is placed under the skin or muscle of your upper chest area. The lead goes through a vein in the chest area to reach the inside of the heart.  Epicardial pacemaker. This type is placed under the skin or muscle of your chest or abdomen. The lead goes through your chest to the outside of the heart. Tell a health care provider about:  Any allergies you have.  All medicines you are taking, including vitamins, herbs, eye drops, creams, and over-the-counter medicines.  Any problems you or family members have had with anesthetic medicines.  Any blood or bone disorders you have.  Any surgeries you have had.  Any medical conditions you have.  Whether you are pregnant or may be pregnant. What are the risks? Generally, this is a safe procedure. However, problems may occur, including:  Infection.  Bleeding.  Failure of the pacemaker or the lead.  Collapse of a lung or bleeding into a lung.  Blood clot inside a blood vessel with a lead.  Damage to the heart.  Infection inside the heart (endocarditis).  Allergic reactions to medicines. What happens before the procedure? Staying hydrated Follow instructions from your health care provider about hydration, which may include:  Up to 2 hours before the procedure - you may continue to drink clear liquids, such as water, clear fruit juice, black coffee, and plain tea.   Eating and drinking restrictions Follow instructions from your health care provider about eating and  drinking, which may include:  8 hours before the procedure - stop eating heavy meals or foods, such as meat, fried foods, or fatty foods.  6 hours before the procedure - stop eating light meals or foods, such as toast or cereal.  6 hours before the procedure - stop drinking milk or drinks that contain milk.  2 hours before the procedure - stop drinking clear liquids. Medicines Ask your health care provider about:  Changing or stopping your regular medicines. This is especially important if you are taking diabetes medicines or blood thinners.  Taking medicines such as aspirin and ibuprofen. These medicines can thin your blood. Do not take these medicines unless your health care provider tells you to take them.  Taking over-the-counter medicines, vitamins, herbs, and supplements. Tests You may have:  A heart evaluation. This may include: ? An electrocardiogram (ECG). This involves placing patches on your skin to check your heart rhythm. ? A chest X-ray. ? An echocardiogram. This is a test that uses sound waves (ultrasound) to produce an image of the heart. ? A cardiac rhythm monitor. This is used to record your heart rhythm and any events for a longer period of time.  Blood tests.  Genetic testing. General instructions  Do not use any products that contain nicotine or tobacco for at least 4 weeks before the procedure. These products include cigarettes, e-cigarettes, and chewing tobacco. If you need help quitting, ask your health care provider.  Ask your health care provider: ? How your surgery site will be marked. ? What steps will be taken to help prevent infection. These steps may include:  Removing hair at the surgery site.  Washing skin with a germ-killing soap.  Receiving antibiotic medicine.  Plan to have someone take you home from the hospital or clinic.  If you will be going home right after the procedure, plan to have someone with you for 24 hours. What happens  during the procedure?  An IV will be inserted into one of your  veins.  You will be given one or more of the following: ? A medicine to help you relax (sedative). ? A medicine to numb the area (local anesthetic). ? A medicine to make you fall asleep (general anesthetic).  The next steps vary depending on the type of pacemaker you will be getting. ? If you are getting a transvenous pacemaker:  An incision will be made in your upper chest.  A pocket will be made for the pacemaker. It may be placed under the skin or between layers of muscle.  The lead will be inserted into a blood vessel that goes to the heart.  While X-rays are taken by an imaging machine (fluoroscopy), the lead will be advanced through the vein to the inside of your heart.  The other end of the lead will be tunneled under the skin and attached to the pacemaker. ? If you are getting an epicardial pacemaker:  An incision will be made near your ribs or breastbone (sternum) for the lead.  The lead will be attached to the outside of your heart.  Another incision will be made in your chest or upper abdomen to create a pocket for the pacemaker.  The free end of the lead will be tunneled under the skin and attached to the pacemaker.  The transvenous or epicardial pacemaker will be tested. Imaging studies may be done to check the lead position.  The incisions will be closed with stitches (sutures), adhesive strips, or skin glue.  Bandages (dressings) will be placed over the incisions. The procedure may vary among health care providers and hospitals. What happens after the procedure?  Your blood pressure, heart rate, breathing rate, and blood oxygen level will be monitored until you leave the hospital or clinic.  You may be given antibiotics.  You will be given pain medicine.  An ECG and chest X-rays will be done.  You may need to wear a continuous type of ECG (Holter monitor) to check your heart rhythm.  Your  health care provider will program the pacemaker.  If you were given a sedative during the procedure, it can affect you for several hours. Do not drive or operate machinery until your health care provider says that it is safe.  You will be given a pacemaker identification card. This card lists the implant date, device model, and manufacturer of your pacemaker. Summary  A pacemaker is a small computer that sends electrical signals to the heart and helps the heart beat normally.  There are different types of pacemakers. A pacemaker may be placed under the skin or muscle of your chest or abdomen.  Follow instructions from your health care provider about eating and drinking and about taking medicines before the procedure. This information is not intended to replace advice given to you by your health care provider. Make sure you discuss any questions you have with your health care provider. Document Revised: 10/29/2019 Document Reviewed: 10/29/2019 Elsevier Patient Education  2021 Elsevier Inc.     Supplemental Discharge Instructions for  Pacemaker/Defibrillator Patients  ACTIVITY No heavy lifting or vigorous activity with your left/right arm for 6 to 8 weeks.  Do not raise your left/right arm above your head for one week.  Gradually raise your affected arm as drawn below.           __  NO DRIVING for     ; you may begin driving on     .  WOUND CARE - Keep the wound area clean and  dry.  Do not get this area wet for one week. No showers for one week; you may shower on     . - The tape/steri-strips on your wound will fall off; do not pull them off.  No bandage is needed on the site.  DO  NOT apply any creams, oils, or ointments to the wound area. - If you notice any drainage or discharge from the wound, any swelling or bruising at the site, or you develop a fever > 101? F after you are discharged home, call the office at once.  SPECIAL INSTRUCTIONS - You are still able to use cellular  telephones; use the ear opposite the side where you have your pacemaker/defibrillator.  Avoid carrying your cellular phone near your device. - When traveling through airports, show security personnel your identification card to avoid being screened in the metal detectors.  Ask the security personnel to use the hand wand. - Avoid arc welding equipment, MRI testing (magnetic resonance imaging), TENS units (transcutaneous nerve stimulators).  Call the office for questions about other devices. - Avoid electrical appliances that are in poor condition or are not properly grounded. - Microwave ovens are safe to be near or to operate.  ADDITIONAL INFORMATION FOR DEFIBRILLATOR PATIENTS SHOULD YOUR DEVICE GO OFF: - If your device goes off ONCE and you feel fine afterward, notify the device clinic nurses. - If your device goes off ONCE and you do not feel well afterward, call 911. - If your device goes off TWICE, call 911. - If your device goes off THREE TIMES IN ONE DAY, call 911.  DO NOT DRIVE YOURSELF OR A FAMILY MEMBER WITH A DEFIBRILLATOR TO THE HOSPITAL--CALL 911.

## 2021-04-12 NOTE — Addendum Note (Signed)
Addended by: Baird Lyons on: 04/12/2021 04:04 PM   Modules accepted: Orders

## 2021-04-22 ENCOUNTER — Telehealth: Payer: Self-pay | Admitting: Cardiovascular Disease

## 2021-04-22 NOTE — Telephone Encounter (Signed)
Spoke to dtr, who is w/ [employer] forces logistics/support/administrative officer. Explained reasoning for previous call, apologized for any inconvenience/stress this caused. They are aware mom does NOT need to go to AFib clinic Tuesday. Dtr verbalized understanding and agreeable to plan.

## 2021-04-22 NOTE — Telephone Encounter (Signed)
Amanda Olsen is calling stating he received two calls from this office with no VM being left. He is fearful something has happened and kept asking if she had passed away. I advised him there is no documentation of this call nor is it documented she is currently admitted or deceased. I advised it may have been the Afib clinic and strongly suggested he call the nonemergency line to perform a wellness check if he feels something has happened. Amanda Olsen was not in agreement with this advice and continued to state that if his mom does not answer the phone when he calls something is wrong. He is currently on the [employer] on vacation and very worried. Please advise.

## 2021-04-26 ENCOUNTER — Ambulatory Visit (HOSPITAL_COMMUNITY): Payer: Medicare HMO | Admitting: Nurse Practitioner

## 2021-05-12 ENCOUNTER — Other Ambulatory Visit (HOSPITAL_COMMUNITY): Payer: Medicare HMO

## 2021-05-12 ENCOUNTER — Other Ambulatory Visit: Payer: Medicare HMO | Admitting: *Deleted

## 2021-05-12 ENCOUNTER — Other Ambulatory Visit: Payer: Self-pay

## 2021-05-12 DIAGNOSIS — I495 Sick sinus syndrome: Secondary | ICD-10-CM | POA: Diagnosis not present

## 2021-05-12 DIAGNOSIS — I4819 Other persistent atrial fibrillation: Secondary | ICD-10-CM

## 2021-05-12 DIAGNOSIS — Z01812 Encounter for preprocedural laboratory examination: Secondary | ICD-10-CM

## 2021-05-12 LAB — CBC
Hematocrit: 38.1 % (ref 34.0–46.6)
Hemoglobin: 12.2 g/dL (ref 11.1–15.9)
MCH: 29.5 pg (ref 26.6–33.0)
MCHC: 32 g/dL (ref 31.5–35.7)
MCV: 92 fL (ref 79–97)
Platelets: 178 10*3/uL (ref 150–450)
RBC: 4.13 x10E6/uL (ref 3.77–5.28)
RDW: 12.6 % (ref 11.7–15.4)
WBC: 4.4 10*3/uL (ref 3.4–10.8)

## 2021-05-12 LAB — BASIC METABOLIC PANEL
BUN/Creatinine Ratio: 20 (ref 12–28)
BUN: 16 mg/dL (ref 8–27)
CO2: 23 mmol/L (ref 20–29)
Calcium: 8.7 mg/dL (ref 8.7–10.3)
Chloride: 99 mmol/L (ref 96–106)
Creatinine, Ser: 0.82 mg/dL (ref 0.57–1.00)
Glucose: 81 mg/dL (ref 65–99)
Potassium: 4.3 mmol/L (ref 3.5–5.2)
Sodium: 136 mmol/L (ref 134–144)
eGFR: 70 mL/min/{1.73_m2} (ref >59)

## 2021-05-12 NOTE — Pre-Procedure Instructions (Signed)
Attempted to call patient regarding procedure instructions.  No answer.  Call son Weyman Croon he speaks [place].  Went over the following instructions with him to relay to his mom.  Instructed son on the following items: Arrival time 1030 Nothing to eat or drink after midnight No meds AM of procedure Responsible person to drive you home and stay with you for 24 hrs Wash with special soap night before and morning of procedure If on anti-coagulant drug instructions Hold tonight's dose of Eliquis  He will relay instructions to his mom.

## 2021-05-13 ENCOUNTER — Ambulatory Visit (HOSPITAL_COMMUNITY): Payer: Medicare HMO

## 2021-05-13 ENCOUNTER — Other Ambulatory Visit: Payer: Self-pay

## 2021-05-13 ENCOUNTER — Ambulatory Visit (HOSPITAL_COMMUNITY)
Admission: RE | Disposition: A | Discharge status: Home / Self Care | Payer: Self-pay | Source: Home / Self Care | Attending: Cardiology

## 2021-05-13 ENCOUNTER — Ambulatory Visit (HOSPITAL_COMMUNITY)
Admission: RE | Admit: 2021-05-13 | Discharge: 2021-05-13 | Disposition: A | Discharge status: Home / Self Care | Payer: Medicare HMO | Attending: Cardiology | Admitting: Cardiology

## 2021-05-13 DIAGNOSIS — I495 Sick sinus syndrome: Secondary | ICD-10-CM | POA: Insufficient documentation

## 2021-05-13 DIAGNOSIS — I480 Paroxysmal atrial fibrillation: Secondary | ICD-10-CM | POA: Insufficient documentation

## 2021-05-13 DIAGNOSIS — E039 Hypothyroidism, unspecified: Secondary | ICD-10-CM | POA: Diagnosis not present

## 2021-05-13 DIAGNOSIS — Z79890 Hormone replacement therapy (postmenopausal): Secondary | ICD-10-CM | POA: Insufficient documentation

## 2021-05-13 DIAGNOSIS — Z7901 Long term (current) use of anticoagulants: Secondary | ICD-10-CM | POA: Diagnosis not present

## 2021-05-13 DIAGNOSIS — Z95818 Presence of other cardiac implants and grafts: Secondary | ICD-10-CM

## 2021-05-13 DIAGNOSIS — I10 Essential (primary) hypertension: Secondary | ICD-10-CM | POA: Insufficient documentation

## 2021-05-13 DIAGNOSIS — Z950 Presence of cardiac pacemaker: Secondary | ICD-10-CM | POA: Diagnosis not present

## 2021-05-13 HISTORY — PX: PACEMAKER IMPLANT: EP1218

## 2021-05-13 SURGERY — PACEMAKER IMPLANT

## 2021-05-13 MED ORDER — HEPARIN (PORCINE) IN NACL 1000-0.9 UT/500ML-% IV SOLN
INTRAVENOUS | Status: AC
  Filled 2021-05-13: qty 500

## 2021-05-13 MED ORDER — FENTANYL CITRATE (PF) 100 MCG/2ML IJ SOLN
INTRAMUSCULAR | Status: DC | PRN
  Administered 2021-05-13: 12.5 ug via INTRAVENOUS
  Administered 2021-05-13: 25 ug via INTRAVENOUS
  Administered 2021-05-13: 12.5 ug via INTRAVENOUS

## 2021-05-13 MED ORDER — CHLORHEXIDINE GLUCONATE 4 % EX LIQD
4.0000 "application " | Freq: Once | CUTANEOUS | Status: DC
  Filled 2021-05-13: qty 60

## 2021-05-13 MED ORDER — SODIUM CHLORIDE 0.9 % IV SOLN: INTRAVENOUS | Status: DC

## 2021-05-13 MED ORDER — LIDOCAINE HCL 1 % IJ SOLN
INTRAMUSCULAR | Status: AC
  Filled 2021-05-13: qty 60

## 2021-05-13 MED ORDER — DILTIAZEM HCL ER COATED BEADS 240 MG PO CP24: 240.0000 mg | ORAL_CAPSULE | Freq: Every day | ORAL | 11 refills | Status: DC

## 2021-05-13 MED ORDER — SODIUM CHLORIDE 0.9 % IV SOLN
INTRAVENOUS | Status: AC
  Filled 2021-05-13: qty 2

## 2021-05-13 MED ORDER — CEFAZOLIN SODIUM-DEXTROSE 2-4 GM/100ML-% IV SOLN
INTRAVENOUS | Status: AC
  Filled 2021-05-13: qty 100

## 2021-05-13 MED ORDER — SODIUM CHLORIDE 0.9 % IV SOLN
80.0000 mg | INTRAVENOUS | Status: AC
  Administered 2021-05-13: 80 mg

## 2021-05-13 MED ORDER — FENTANYL CITRATE (PF) 100 MCG/2ML IJ SOLN
INTRAMUSCULAR | Status: AC
  Filled 2021-05-13: qty 2

## 2021-05-13 MED ORDER — ONDANSETRON HCL 4 MG/2ML IJ SOLN: 4.0000 mg | Freq: Four times a day (QID) | INTRAMUSCULAR | Status: DC | PRN

## 2021-05-13 MED ORDER — HEPARIN (PORCINE) IN NACL 1000-0.9 UT/500ML-% IV SOLN
INTRAVENOUS | Status: DC | PRN
  Administered 2021-05-13: 500 mL

## 2021-05-13 MED ORDER — LIDOCAINE HCL (PF) 1 % IJ SOLN
INTRAMUSCULAR | Status: DC | PRN
  Administered 2021-05-13: 45 mL

## 2021-05-13 MED ORDER — CEFAZOLIN SODIUM-DEXTROSE 1-4 GM/50ML-% IV SOLN: 1.0000 g | Freq: Four times a day (QID) | INTRAVENOUS | Status: DC

## 2021-05-13 MED ORDER — MIDAZOLAM HCL 5 MG/5ML IJ SOLN
INTRAMUSCULAR | Status: DC | PRN
  Administered 2021-05-13 (×2): 1 mg via INTRAVENOUS

## 2021-05-13 MED ORDER — MIDAZOLAM HCL 5 MG/5ML IJ SOLN
INTRAMUSCULAR | Status: AC
  Filled 2021-05-13: qty 5

## 2021-05-13 MED ORDER — ACETAMINOPHEN 325 MG PO TABS
325.0000 mg | ORAL_TABLET | ORAL | Status: DC | PRN
  Administered 2021-05-13: 650 mg via ORAL
  Filled 2021-05-13 (×3): qty 2

## 2021-05-13 MED ORDER — CEFAZOLIN SODIUM-DEXTROSE 2-4 GM/100ML-% IV SOLN
2.0000 g | INTRAVENOUS | Status: AC
  Administered 2021-05-13: 2 g via INTRAVENOUS

## 2021-05-13 SURGICAL SUPPLY — 7 items
CABLE SURGICAL S-101-97-12 (CABLE) ×2 IMPLANT
LEAD TENDRIL MRI 52CM LPA1200M (Lead) ×2 IMPLANT
LEAD TENDRIL MRI 58CM LPA1200M (Lead) ×2 IMPLANT
PACEMAKER ASSURITY DR-RF (Pacemaker) ×2 IMPLANT
PAD PRO RADIOLUCENT 2001M-C (PAD) ×2 IMPLANT
SHEATH 8FR PRELUDE SNAP 13 (SHEATH) ×4 IMPLANT
TRAY PACEMAKER INSERTION (PACKS) ×2 IMPLANT

## 2021-05-13 NOTE — H&P (Signed)
Electrophysiology Office Note   Date:  05/13/2021   ID:  Amanda Olsen, DOB Sep 06, 1934, MRN [id number]  PCP:  Amanda Reichmann, DO  Cardiologist:  Amanda Olsen Primary Electrophysiologist:  Amanda Armato Jorja Loa, MD    No chief complaint on file.    History of Present Illness: Amanda Olsen is a 87 y.o. female who is being seen today for the evaluation of atrial fibrillation at the request of No ref. provider found. Presenting today for electrophysiology evaluation.    She has a history significant for persistent atrial fibrillation, hypertension.  She was initially on dofetilide but had more episodes and has been switched to amiodarone.  She wore a cardiac monitor that showed significant bradycardia due to sick sinus syndrome.  Today, denies symptoms of palpitations, chest pain, shortness of breath, orthopnea, PND, lower extremity edema, claudication, dizziness, presyncope, syncope, bleeding, or neurologic sequela. The patient is tolerating medications without difficulties. Plan pacemaker today.   Past Medical History:  Diagnosis Date  . Atrial fibrillation (HCC)    mostly in SR on Tikosyn  . Cataract   . HTN (hypertension)   . Rosacea    Past Surgical History:  Procedure Laterality Date  . VARICOSE VEIN SURGERY       Current Facility-Administered Medications  Medication Dose Route Frequency Provider Last Rate Last Admin  . 0.9 %  sodium chloride infusion   Intravenous Continuous Amanda Preble Daphine Deutscher, MD      . ceFAZolin (ANCEF) IVPB 2g/100 mL premix  2 g Intravenous On Call Amanda Corkery Daphine Deutscher, MD      . chlorhexidine (HIBICLENS) 4 % liquid 4 application  4 application Topical Once Amanda Mukherjee Daphine Deutscher, MD      . gentamicin (GARAMYCIN) 80 mg in sodium chloride 0.9 % 500 mL irrigation  80 mg Irrigation On Call Amanda Lemming, MD        Allergies:   Other, Grapefruit oil, and Strawberry flavor   Social History:  The patient  reports that she has never smoked. She has  never used smokeless tobacco. She reports that she does not drink alcohol and does not use drugs.   Family History:  The patient's family history includes Heart Problems in her mother.   ROS:  Please see the history of present illness.   Otherwise, review of systems is positive for none.   All other systems are reviewed and negative.   PHYSICAL EXAM: VS:  BP (!) 176/89   Pulse (!) 56   Temp 98 F (36.7 C) (Oral)   Ht 5\' 6"  (1.676 m)   Wt 55.8 kg   SpO2 100%   BMI 19.85 kg/m  , BMI Body mass index is 19.85 kg/m. GEN: Well nourished, well developed, in no acute distress  HEENT: normal  Neck: no JVD, carotid bruits, or masses Cardiac: RRR; no murmurs, rubs, or gallops,no edema  Respiratory:  clear to auscultation bilaterally, normal work of breathing GI: soft, nontender, nondistended, + BS MS: no deformity or atrophy  Skin: warm and dry Neuro:  Strength and sensation are intact Psych: euthymic mood, full affect  Recent Labs: 01/09/2021: Magnesium 1.9 03/17/2021: ALT 18; TSH 24.498 05/12/2021: BUN 16; Creatinine, Ser 0.82; Hemoglobin 12.2; Platelets 178; Potassium 4.3; Sodium 136    Lipid Panel     Component Value Date/Time   CHOL  07/02/2008 0425    130        ATP III CLASSIFICATION:  <200     mg/dL   Desirable  07/04/2008  mg/dL   Borderline High  >=389    mg/dL   High   TRIG 87 37/34/2876 0425   HDL 42 07/02/2008 0425   CHOLHDL 3.1 07/02/2008 0425   VLDL 17 07/02/2008 0425   LDLCALC  07/02/2008 0425    71        Total Cholesterol/HDL:CHD Risk Coronary Heart Disease Risk Table                     Men   Women  1/2 Average Risk   3.4   3.3     Wt Readings from Last 3 Encounters:  05/13/21 55.8 kg  04/12/21 56.1 kg  03/22/21 55.3 kg      Other studies Reviewed: Additional studies/ records that were reviewed today include: TTE 05/05/2020 Review of the above records today demonstrates:  1. Left ventricular ejection fraction, by estimation, is 50 to 55%. The  left  ventricle has low normal function. The left ventricle has no regional  wall motion abnormalities. Left ventricular diastolic parameters are  consistent with Grade I diastolic  dysfunction (impaired relaxation).  2. Right ventricular systolic function is normal. The right ventricular  size is normal. There is normal pulmonary artery systolic pressure. The  estimated right ventricular systolic pressure is 19.6 mmHg.  3. The mitral valve is grossly normal. No evidence of mitral valve  regurgitation. No evidence of mitral stenosis.  4. The aortic valve is tricuspid. Aortic valve regurgitation is not  visualized. Mild aortic valve sclerosis is present, with no evidence of  aortic valve stenosis.  5. The inferior vena cava is normal in size with greater than 50%  respiratory variability, suggesting right atrial pressure of 3 mmHg.   Cardiac monitor 03/18/2021 personally reviewed Max 182 bpm 06:16pm, 03/24 Min 29 bpm 10:55am, 03/29 Avg 50 bpm 1.5% supraventricular ectopy <1% ventricular ectopy 53 SVT runs, all less than 15 seconds 16 pauses, longest 5.8 seconds, 4.2 second pause in the evening hours, longest pauses nocturnal No symptoms reported  ASSESSMENT AND PLAN:  1.  Paroxysmal atrial fibrillation: CHA2DS2-VASc of 4.  Currently on amiodarone.  Unfortunately she has had more episodes of atrial fibrillation.  She is also on Eliquis.  She has had nosebleeds in the past, and I am concerned that if she has more nosebleeds, that she [name] stop taking her anticoagulation.     2.  Hypertension: Currently well controlled  3.  Hypothyroidism: Primary care following.  Currently on Synthroid.  4.  Sick sinus syndrome: [name] has presented today for surgery, with the diagnosis of tachy/brady syndrome.  The various methods of treatment have been discussed with the patient and family. After consideration of risks, benefits and other options for treatment, the patient has consented to   Procedure(s): Pacemaker implant as a surgical intervention .  Risks include but not limited to bleeding, infection, pneumothorax, perforation, tamponade, vascular damage, renal failure, MI, stroke, death, and lead dislodgement . The patient's history has been reviewed, patient examined, no change in status, stable for surgery.  I have reviewed the patient's chart and labs.  Questions were answered to the patient's satisfaction.    Damonique Brunelle Elberta Fortis, MD 05/13/2021 10:47 AM

## 2021-05-13 NOTE — Progress Notes (Signed)
Dr Sammie Bench aware of CXR report.

## 2021-05-13 NOTE — Progress Notes (Signed)
CXR obtained and pacemaker interrogated. Pt states headache has resolved with pain med.

## 2021-05-13 NOTE — Discharge Instructions (Signed)
After Your Pacemaker   . You have a [employer] Jude Pacemaker  ACTIVITY . Do not lift your arm above shoulder height for 1 week after your procedure. After 7 days, you may progress as below.  . You should remove your sling 24 hours after your procedure, unless otherwise instructed by your provider.     Friday May 20, 2021  Saturday May 21, 2021 Sunday May 22, 2021 Monday May 23, 2021   . Do not lift, push, pull, or carry anything over 10 pounds with the affected arm until 6 weeks (Friday June 24, 2021 ) after your procedure.   . You may drive AFTER your wound check, unless you have been told otherwise by your provider.   . Ask your healthcare provider when you can go back to work   INCISION/Dressing . If you are on a blood thinner such as Coumadin, Xarelto, Eliquis, Plavix, or Pradaxa please confirm with your provider when this should be resumed.        Tonight 05/13/21  . If large square, outer bandage is left in place, this can be removed after 24 hours from your procedure. Do not remove steri-strips or glue as below.   . Monitor your Pacemaker site for redness, swelling, and drainage. Call the device clinic at [phone] if you experience these symptoms or fever/chills.  . If your incision is sealed with Steri-strips or staples, you may shower 10 days after your procedure or when told by your provider. Do not remove the steri-strips or let the shower hit directly on your site. You may wash around your site with soap and water.    Marland Kitchen Avoid lotions, ointments, or perfumes over your incision until it is well-healed.  . You may use a hot tub or a pool AFTER your wound check appointment if the incision is completely closed.  Marland Kitchen PAcemaker Alerts:  Some alerts are vibratory and others beep. These are NOT emergencies. Please call our office to let us know. If this occurs at night or on weekends, it can wait until the next business day. Send a remote transmission.  . If your device is  capable of reading fluid status (for heart failure), you will be offered monthly monitoring to review this with you.   DEVICE MANAGEMENT . Remote monitoring is used to monitor your pacemaker from home. This monitoring is scheduled every 91 days by our office. It allows [place] to keep an eye on the functioning of your device to ensure it is working properly. You will routinely see your Electrophysiologist annually (more often if necessary).   . You should receive your ID card for your new device in 4-8 weeks. Keep this card with you at all times once received. Consider wearing a medical alert bracelet or necklace.  . Your Pacemaker may be MRI compatible. This will be discussed at your next office visit/wound check.  You should avoid contact with strong electric or magnetic fields.    Do not use amateur (ham) radio equipment or electric (arc) welding torches. MP3 player headphones with magnets should not be used. Some devices are safe to use if held at least 12 inches (30 cm) from your Pacemaker. These include power tools, lawn mowers, and speakers. If you are unsure if something is safe to use, ask your health care provider.   When using your cell phone, hold it to the ear that is on the opposite side from the Pacemaker. Do not leave your cell phone in a pocket over  the Pacemaker.   You may safely use electric blankets, heating pads, computers, and microwave ovens.  Call the office right away if:  You have chest pain.  You feel more short of breath than you have felt before.  You feel more light-headed than you have felt before.  Your incision starts to open up.  This information is not intended to replace advice given to you by your health care provider. Make sure you discuss any questions you have with your health care provider.

## 2021-05-13 NOTE — Progress Notes (Signed)
Informed Camnitz, MD of pt's BP. MD okay with pressures, pt to take home meds tonight once D/C'd.

## 2021-05-16 ENCOUNTER — Encounter (HOSPITAL_COMMUNITY): Payer: Self-pay | Admitting: Cardiology

## 2021-05-17 MED FILL — Lidocaine HCl Local Inj 1%: INTRAMUSCULAR | Qty: 45 | Status: AC

## 2021-05-26 ENCOUNTER — Ambulatory Visit (INDEPENDENT_AMBULATORY_CARE_PROVIDER_SITE_OTHER): Payer: Medicare HMO

## 2021-05-26 ENCOUNTER — Other Ambulatory Visit: Payer: Self-pay

## 2021-05-26 DIAGNOSIS — I495 Sick sinus syndrome: Secondary | ICD-10-CM

## 2021-05-26 LAB — CUP PACEART INCLINIC DEVICE CHECK
Battery Remaining Longevity: 110 mo
Battery Voltage: 3.1 V
Brady Statistic RA Percent Paced: 65 %
Brady Statistic RV Percent Paced: 0.03 %
Date Time Interrogation Session: 20220616090547
Implantable Lead Implant Date: 20220603
Implantable Lead Implant Date: 20220603
Implantable Lead Location: 753860
Implantable Lead Location: 753860
Implantable Pulse Generator Implant Date: 20220603
Lead Channel Impedance Value: 562.5 Ohm
Lead Channel Impedance Value: 575 Ohm
Lead Channel Pacing Threshold Amplitude: 0.5 V
Lead Channel Pacing Threshold Amplitude: 1 V
Lead Channel Pacing Threshold Pulse Width: 0.5 ms
Lead Channel Pacing Threshold Pulse Width: 0.5 ms
Lead Channel Sensing Intrinsic Amplitude: 12 mV
Lead Channel Sensing Intrinsic Amplitude: 2.5 mV
Lead Channel Setting Pacing Amplitude: 3.5 V
Lead Channel Setting Pacing Amplitude: 3.5 V
Lead Channel Setting Pacing Pulse Width: 0.5 ms
Lead Channel Setting Sensing Sensitivity: 2 mV
Pulse Gen Model: 2272
Pulse Gen Serial Number: 3927571

## 2021-05-26 NOTE — Progress Notes (Signed)
"  After Your Pacemaker" packet reviewed and given to patient with verbal understanding.   Wound check appointment. Steri-strips removed. Wound without redness or edema. Incision edges approximated, wound well healed. Normal device function. Thresholds, sensing, and impedances consistent with implant measurements. Device programmed at 3.5V/auto capture programmed on for extra safety margin until 3 month visit. Histogram distribution appropriate for patient and level of activity. No mode switches or high ventricular rates noted. Patient educated about wound care, arm mobility, lifting restrictions. ROV in 3 months with Dr. Elberta Fortis.

## 2021-06-15 DIAGNOSIS — R946 Abnormal results of thyroid function studies: Secondary | ICD-10-CM | POA: Diagnosis not present

## 2021-06-15 DIAGNOSIS — I10 Essential (primary) hypertension: Secondary | ICD-10-CM | POA: Diagnosis not present

## 2021-06-15 DIAGNOSIS — E7800 Pure hypercholesterolemia, unspecified: Secondary | ICD-10-CM | POA: Diagnosis not present

## 2021-06-15 DIAGNOSIS — R7301 Impaired fasting glucose: Secondary | ICD-10-CM | POA: Diagnosis not present

## 2021-06-15 DIAGNOSIS — E559 Vitamin D deficiency, unspecified: Secondary | ICD-10-CM | POA: Diagnosis not present

## 2021-06-15 DIAGNOSIS — I480 Paroxysmal atrial fibrillation: Secondary | ICD-10-CM | POA: Diagnosis not present

## 2021-06-16 ENCOUNTER — Other Ambulatory Visit (HOSPITAL_COMMUNITY): Payer: Medicare HMO

## 2021-06-22 ENCOUNTER — Other Ambulatory Visit (HOSPITAL_COMMUNITY): Payer: Self-pay | Admitting: Nurse Practitioner

## 2021-06-22 MED ORDER — POTASSIUM CHLORIDE CRYS ER 20 MEQ PO TBCR: 10.0000 meq | EXTENDED_RELEASE_TABLET | Freq: Two times a day (BID) | ORAL | 3 refills | Status: DC

## 2021-06-29 DIAGNOSIS — Z0000 Encounter for general adult medical examination without abnormal findings: Secondary | ICD-10-CM | POA: Diagnosis not present

## 2021-06-29 DIAGNOSIS — E039 Hypothyroidism, unspecified: Secondary | ICD-10-CM | POA: Diagnosis not present

## 2021-06-29 DIAGNOSIS — Z950 Presence of cardiac pacemaker: Secondary | ICD-10-CM | POA: Diagnosis not present

## 2021-06-29 DIAGNOSIS — Z8679 Personal history of other diseases of the circulatory system: Secondary | ICD-10-CM | POA: Diagnosis not present

## 2021-08-10 DIAGNOSIS — E039 Hypothyroidism, unspecified: Secondary | ICD-10-CM | POA: Diagnosis not present

## 2021-08-16 ENCOUNTER — Ambulatory Visit (INDEPENDENT_AMBULATORY_CARE_PROVIDER_SITE_OTHER): Payer: Medicare HMO

## 2021-08-16 DIAGNOSIS — I495 Sick sinus syndrome: Secondary | ICD-10-CM | POA: Diagnosis not present

## 2021-08-16 LAB — CUP PACEART REMOTE DEVICE CHECK
Battery Remaining Longevity: 85 mo
Battery Remaining Percentage: 95.5 %
Battery Voltage: 3.02 V
Brady Statistic AP VP Percent: 1 %
Brady Statistic AP VS Percent: 76 %
Brady Statistic AS VP Percent: 1 %
Brady Statistic AS VS Percent: 24 %
Brady Statistic RA Percent Paced: 72 %
Brady Statistic RV Percent Paced: 1 %
Date Time Interrogation Session: 20220906020015
Implantable Lead Implant Date: 20220603
Implantable Lead Implant Date: 20220603
Implantable Lead Location: 753860
Implantable Lead Location: 753860
Implantable Pulse Generator Implant Date: 20220603
Lead Channel Impedance Value: 480 Ohm
Lead Channel Impedance Value: 580 Ohm
Lead Channel Pacing Threshold Amplitude: 0.5 V
Lead Channel Pacing Threshold Amplitude: 1 V
Lead Channel Pacing Threshold Pulse Width: 0.5 ms
Lead Channel Pacing Threshold Pulse Width: 0.5 ms
Lead Channel Sensing Intrinsic Amplitude: 12 mV
Lead Channel Sensing Intrinsic Amplitude: 3.8 mV
Lead Channel Setting Pacing Amplitude: 3.5 V
Lead Channel Setting Pacing Amplitude: 3.5 V
Lead Channel Setting Pacing Pulse Width: 0.5 ms
Lead Channel Setting Sensing Sensitivity: 2 mV
Pulse Gen Model: 2272
Pulse Gen Serial Number: 3927571

## 2021-08-18 ENCOUNTER — Ambulatory Visit (INDEPENDENT_AMBULATORY_CARE_PROVIDER_SITE_OTHER): Payer: Medicare HMO | Admitting: Cardiology

## 2021-08-18 ENCOUNTER — Encounter: Payer: Self-pay | Admitting: Cardiology

## 2021-08-18 ENCOUNTER — Other Ambulatory Visit: Payer: Self-pay

## 2021-08-18 VITALS — BP 140/70 | HR 56 | Ht 66.0 in | Wt 131.0 lb

## 2021-08-18 DIAGNOSIS — Z79899 Other long term (current) drug therapy: Secondary | ICD-10-CM

## 2021-08-18 DIAGNOSIS — I480 Paroxysmal atrial fibrillation: Secondary | ICD-10-CM | POA: Diagnosis not present

## 2021-08-18 DIAGNOSIS — I495 Sick sinus syndrome: Secondary | ICD-10-CM

## 2021-08-18 NOTE — Patient Instructions (Addendum)
Medication Instructions:  Your physician recommends that you continue on your current medications as directed. Please refer to the Current Medication list given to you today.  *If you need a refill on your cardiac medications before your next appointment, please call your pharmacy*   Lab Work: None ordered  If you have labs (blood work) drawn today and your tests are completely normal, you will receive your results only by: MyChart Message (if you have MyChart) OR A paper copy in the mail If you have any lab test that is abnormal or we need to change your treatment, we will call you to review the results.   Testing/Procedures: None ordered   Follow-Up: At [facility], you and your health needs are our priority.  As part of our continuing mission to provide you with exceptional heart care, we have created designated Provider Care Teams.  These Care Teams include your primary Cardiologist (physician) and Advanced Practice Providers (APPs -  Physician Assistants and Nurse Practitioners) who all work together to provide you with the care you need, when you need it.   Your next appointment:   6 month(s)  The format for your next appointment:   In Person  Provider:   You will see one of the following Advanced Practice Providers on your designated Care Team:   Francis Dowse, [place] Casimiro Needle "Mardelle Matte" Lanna Poche, [place]     Thank you for choosing [facility]!!   Dory Horn, RN [phone]

## 2021-08-18 NOTE — Progress Notes (Signed)
Electrophysiology Office Note   Date:  08/18/2021   ID:  Amanda Olsen, DOB 02-24-1934, MRN [id number]  PCP:  [name], DO  Cardiologist:  Eden Emms Primary Electrophysiologist:  Clair Bardwell Jorja Loa, MD    No chief complaint on file.    History of Present Illness: Amanda Olsen is a 87 y.o. female who is being seen today for the evaluation of atrial fibrillation at the request of [name], DO. Presenting today for electrophysiology evaluation.    She has a history significant for persistent atrial fibrillation, hypertension.  She was initially on dofetilide but continued to have episodes of atrial fibrillation and switched to amiodarone.  She wore a cardiac monitor that showed significant episodes of bradycardia due to sick sinus syndrome.  She has now status post Abbott dual-chamber pacemaker implanted 05/13/2021.  Today, denies symptoms of palpitations, chest pain, shortness of breath, orthopnea, PND, lower extremity edema, claudication, dizziness, presyncope, syncope, bleeding, or neurologic sequela. The patient is tolerating medications without difficulties.  Since being seen she has done well.  She has had no chest pain or shortness of breath.  Is able to do all of her daily activities without restriction.  She is noted no further episodes of atrial fibrillation.  Her blood pressure is remained well controlled.  Past Medical History:  Diagnosis Date   Atrial fibrillation (HCC)    mostly in SR on Tikosyn   Cataract    HTN (hypertension)    Rosacea    Past Surgical History:  Procedure Laterality Date   PACEMAKER IMPLANT N/A 05/13/2021   Procedure: PACEMAKER IMPLANT;  Surgeon: Regan Lemming, MD;  Location: [facility] INVASIVE CV LAB;  Service: Cardiovascular;  Laterality: N/A;   VARICOSE VEIN SURGERY       Current Outpatient Medications  Medication Sig Dispense Refill   amiodarone (PACERONE) 200 MG tablet Take 0.5 tablets (100 mg total) by mouth daily. 45 tablet 1    apixaban (ELIQUIS) 2.5 MG TABS tablet Take 1 tablet (2.5 mg total) by mouth 2 (two) times daily. 60 tablet 6   Cholecalciferol (VITAMIN D-3) 25 MCG (1000 UT) CAPS Take 1,000 Units by mouth daily.      diltiazem (CARDIZEM) 30 MG tablet Take 1/2-1 tablet every 4 hours AS NEEDED for heart rate >100 (Patient taking differently: Take 15-30 mg by mouth See admin instructions. Take 15-30 mg tablet every 4 hours AS NEEDED for heart rate >100) 45 tablet 1   diltiazem (CARTIA XT) 240 MG 24 hr capsule Take 1 capsule (240 mg total) by mouth daily. 30 capsule 11   levothyroxine (SYNTHROID) 25 MCG tablet Take 1 tablet by mouth daily.     Magnesium Oxide 400 MG CAPS Take 1 capsule (400 mg total) by mouth daily. Nature Made Brand  0   potassium chloride SA (KLOR-CON M20) 20 MEQ tablet Take 0.5 tablets (10 mEq total) by mouth 2 (two) times daily. 90 tablet 3   vitamin B-12 (CYANOCOBALAMIN) 100 MCG tablet Take 100 mcg by mouth daily.     amLODipine (NORVASC) 2.5 MG tablet Take 1 tablet (2.5 mg total) by mouth daily after lunch. 30 tablet 3   lisinopril (ZESTRIL) 20 MG tablet Take 1 tablet (20 mg total) by mouth 2 (two) times daily. 180 tablet 2   Polyethyl Glycol-Propyl Glycol (SYSTANE HYDRATION PF OP) Place 1 drop into both eyes 3 (three) times daily as needed (for dryness).     No current facility-administered medications for this visit.    Allergies:  Other, Grapefruit oil, and Strawberry flavor   Social History:  The patient  reports that she has never smoked. She has never used smokeless tobacco. She reports that she does not drink alcohol and does not use drugs.   Family History:  The patient's family history includes Heart Problems in her mother.   ROS:  Please see the history of present illness.   Otherwise, review of systems is positive for none.   All other systems are reviewed and negative.   PHYSICAL EXAM: VS:  BP 140/70   Pulse (!) 56   Ht 5\' 6"  (1.676 m)   Wt 131 lb (59.4 kg)   SpO2 98%    BMI 21.14 kg/m  , BMI Body mass index is 21.14 kg/m. GEN: Well nourished, well developed, in no acute distress  HEENT: normal  Neck: no JVD, carotid bruits, or masses Cardiac: RRR; no murmurs, rubs, or gallops,no edema  Respiratory:  clear to auscultation bilaterally, normal work of breathing GI: soft, nontender, nondistended, + BS MS: no deformity or atrophy  Skin: warm and dry, device site well healed Neuro:  Strength and sensation are intact Psych: euthymic mood, full affect  EKG:  EKG is ordered today. Personal review of the ekg ordered shows sinus rhythm, rate 56  Personal review of the device interrogation today. Results in Paceart   Recent Labs: 01/09/2021: Magnesium 1.9 03/17/2021: ALT 18; TSH 24.498 05/12/2021: BUN 16; Creatinine, Ser 0.82; Hemoglobin 12.2; Platelets 178; Potassium 4.3; Sodium 136    Lipid Panel     Component Value Date/Time   CHOL  07/02/2008 0425    130        ATP III CLASSIFICATION:  <200     mg/dL   Desirable  07/04/2008  mg/dL   Borderline High  195-093    mg/dL   High   TRIG 87 >=267 0425   HDL 42 07/02/2008 0425   CHOLHDL 3.1 07/02/2008 0425   VLDL 17 07/02/2008 0425   LDLCALC  07/02/2008 0425    71        Total Cholesterol/HDL:CHD Risk Coronary Heart Disease Risk Table                     Men   Women  1/2 Average Risk   3.4   3.3     Wt Readings from Last 3 Encounters:  08/18/21 131 lb (59.4 kg)  05/13/21 123 lb (55.8 kg)  04/12/21 123 lb 9.6 oz (56.1 kg)      Other studies Reviewed: Additional studies/ records that were reviewed today include: TTE 05/05/2020 Review of the above records today demonstrates:   1. Left ventricular ejection fraction, by estimation, is 50 to 55%. The  left ventricle has low normal function. The left ventricle has no regional  wall motion abnormalities. Left ventricular diastolic parameters are  consistent with Grade I diastolic  dysfunction (impaired relaxation).   2. Right ventricular systolic  function is normal. The right ventricular  size is normal. There is normal pulmonary artery systolic pressure. The  estimated right ventricular systolic pressure is 19.6 mmHg.   3. The mitral valve is grossly normal. No evidence of mitral valve  regurgitation. No evidence of mitral stenosis.   4. The aortic valve is tricuspid. Aortic valve regurgitation is not  visualized. Mild aortic valve sclerosis is present, with no evidence of  aortic valve stenosis.   5. The inferior vena cava is normal in size with greater than 50%  respiratory  variability, suggesting right atrial pressure of 3 mmHg.   Cardiac monitor 03/18/2021 personally reviewed Max 182 bpm 06:16pm, 03/24 Min 29 bpm 10:55am, 03/29 Avg 50 bpm 1.5% supraventricular ectopy <1% ventricular ectopy 53 SVT runs, all less than 15 seconds 16 pauses, longest 5.8 seconds, 4.2 second pause in the evening hours, longest pauses nocturnal No symptoms reported  ASSESSMENT AND PLAN:  1.  Paroxysmal atrial fibrillation: CHA2DS2-VASc of 4.  Currently on amiodarone 200 mg, Eliquis 5 mg twice daily.  She has had nosebleeds in the past.  If further nosebleeds occur, we Jonothan Heberle plan to stop Eliquis.  She is remained in sinus rhythm.  She is overall comfortable with her control.  We Jash Wahlen work to get labs from her primary physician's office.  2.  Hypertension: Currently well controlled  3.  Hypothyroidism: Currently on Synthroid per primary physician.  4.  Sick sinus syndrome: Has had atrial fibrillation with rapid rates as well as bradycardia.  Is now status post Abbott dual-chamber pacemaker implanted 05/13/2021.  Device functioning appropriately.  Rate response turned on today.   Current medicines are reviewed at length with the patient today.   The patient does not have concerns regarding her medicines.  The following changes were made today: None  Labs/ tests ordered today include:  Orders Placed This Encounter  Procedures   EKG 12-Lead       Disposition:   FU with Marypat Kimmet 6 months  Signed, Jaimarie Rapozo Jorja Loa, MD  08/18/2021 2:51 PM     [facility] [address] [place] [address] [phone] (office) [phone] (fax)

## 2021-08-22 DIAGNOSIS — E559 Vitamin D deficiency, unspecified: Secondary | ICD-10-CM | POA: Diagnosis not present

## 2021-08-22 DIAGNOSIS — Z950 Presence of cardiac pacemaker: Secondary | ICD-10-CM | POA: Diagnosis not present

## 2021-08-22 DIAGNOSIS — E7800 Pure hypercholesterolemia, unspecified: Secondary | ICD-10-CM | POA: Diagnosis not present

## 2021-08-22 DIAGNOSIS — E039 Hypothyroidism, unspecified: Secondary | ICD-10-CM | POA: Diagnosis not present

## 2021-08-24 NOTE — Progress Notes (Signed)
Remote pacemaker transmission.   

## 2021-09-05 ENCOUNTER — Other Ambulatory Visit (HOSPITAL_COMMUNITY): Payer: Self-pay | Admitting: Nurse Practitioner

## 2021-10-10 DIAGNOSIS — L570 Actinic keratosis: Secondary | ICD-10-CM | POA: Diagnosis not present

## 2021-10-10 DIAGNOSIS — X32XXXA Exposure to sunlight, initial encounter: Secondary | ICD-10-CM | POA: Diagnosis not present

## 2021-10-10 DIAGNOSIS — L718 Other rosacea: Secondary | ICD-10-CM | POA: Diagnosis not present

## 2021-11-09 ENCOUNTER — Other Ambulatory Visit: Payer: Self-pay | Admitting: *Deleted

## 2021-11-09 MED ORDER — APIXABAN 2.5 MG PO TABS: 2.5000 mg | ORAL_TABLET | Freq: Two times a day (BID) | ORAL | 5 refills | Status: DC

## 2021-11-09 NOTE — Telephone Encounter (Signed)
Eliquis 2.5mg  paper refill request received. Patient is 87 years old, weight-59.4kg, Crea-0.82 on 05/12/2021, Diagnosis-Afib, and last seen by Dr. Elberta Fortis on 08/18/2021. Dose is appropriate based on dosing criteria. Will send in refill to requested pharmacy.

## 2021-11-10 DIAGNOSIS — L728 Other follicular cysts of the skin and subcutaneous tissue: Secondary | ICD-10-CM | POA: Diagnosis not present

## 2021-11-10 DIAGNOSIS — L570 Actinic keratosis: Secondary | ICD-10-CM | POA: Diagnosis not present

## 2021-11-10 DIAGNOSIS — X32XXXD Exposure to sunlight, subsequent encounter: Secondary | ICD-10-CM | POA: Diagnosis not present

## 2021-11-15 ENCOUNTER — Ambulatory Visit (INDEPENDENT_AMBULATORY_CARE_PROVIDER_SITE_OTHER): Payer: Medicare HMO

## 2021-11-15 DIAGNOSIS — I495 Sick sinus syndrome: Secondary | ICD-10-CM | POA: Diagnosis not present

## 2021-11-15 LAB — CUP PACEART REMOTE DEVICE CHECK
Battery Remaining Longevity: 118 mo
Battery Remaining Percentage: 95.5 %
Battery Voltage: 3.02 V
Brady Statistic AP VP Percent: 1 %
Brady Statistic AP VS Percent: 74 %
Brady Statistic AS VP Percent: 1 %
Brady Statistic AS VS Percent: 26 %
Brady Statistic RA Percent Paced: 72 %
Brady Statistic RV Percent Paced: 1 %
Date Time Interrogation Session: 20221206025034
Implantable Lead Implant Date: 20220603
Implantable Lead Implant Date: 20220603
Implantable Lead Location: 753860
Implantable Lead Location: 753860
Implantable Pulse Generator Implant Date: 20220603
Lead Channel Impedance Value: 390 Ohm
Lead Channel Impedance Value: 510 Ohm
Lead Channel Pacing Threshold Amplitude: 0.5 V
Lead Channel Pacing Threshold Amplitude: 0.75 V
Lead Channel Pacing Threshold Pulse Width: 0.5 ms
Lead Channel Pacing Threshold Pulse Width: 0.5 ms
Lead Channel Sensing Intrinsic Amplitude: 12 mV
Lead Channel Sensing Intrinsic Amplitude: 3.4 mV
Lead Channel Setting Pacing Amplitude: 2 V
Lead Channel Setting Pacing Amplitude: 2.5 V
Lead Channel Setting Pacing Pulse Width: 0.5 ms
Lead Channel Setting Sensing Sensitivity: 2 mV
Pulse Gen Model: 2272
Pulse Gen Serial Number: 3927571

## 2021-11-24 NOTE — Progress Notes (Signed)
Remote pacemaker transmission.   

## 2021-12-19 DIAGNOSIS — Z8616 Personal history of COVID-19: Secondary | ICD-10-CM | POA: Diagnosis not present

## 2021-12-19 DIAGNOSIS — R059 Cough, unspecified: Secondary | ICD-10-CM | POA: Diagnosis not present

## 2021-12-29 DIAGNOSIS — M79651 Pain in right thigh: Secondary | ICD-10-CM | POA: Diagnosis not present

## 2021-12-29 DIAGNOSIS — M7989 Other specified soft tissue disorders: Secondary | ICD-10-CM | POA: Diagnosis not present

## 2021-12-29 DIAGNOSIS — M79644 Pain in right finger(s): Secondary | ICD-10-CM | POA: Diagnosis not present

## 2022-02-13 DIAGNOSIS — E7800 Pure hypercholesterolemia, unspecified: Secondary | ICD-10-CM | POA: Diagnosis not present

## 2022-02-13 DIAGNOSIS — E039 Hypothyroidism, unspecified: Secondary | ICD-10-CM | POA: Diagnosis not present

## 2022-02-13 DIAGNOSIS — E559 Vitamin D deficiency, unspecified: Secondary | ICD-10-CM | POA: Diagnosis not present

## 2022-02-14 ENCOUNTER — Ambulatory Visit (INDEPENDENT_AMBULATORY_CARE_PROVIDER_SITE_OTHER): Payer: Medicare HMO

## 2022-02-14 DIAGNOSIS — I495 Sick sinus syndrome: Secondary | ICD-10-CM

## 2022-02-14 LAB — CUP PACEART REMOTE DEVICE CHECK
Battery Remaining Longevity: 116 mo
Battery Remaining Percentage: 95.5 %
Battery Voltage: 3.02 V
Brady Statistic AP VP Percent: 1 %
Brady Statistic AP VS Percent: 68 %
Brady Statistic AS VP Percent: 1 %
Brady Statistic AS VS Percent: 31 %
Brady Statistic RA Percent Paced: 67 %
Brady Statistic RV Percent Paced: 1 %
Date Time Interrogation Session: 20230307073057
Implantable Lead Implant Date: 20220603
Implantable Lead Implant Date: 20220603
Implantable Lead Location: 753860
Implantable Lead Location: 753860
Implantable Pulse Generator Implant Date: 20220603
Lead Channel Impedance Value: 410 Ohm
Lead Channel Impedance Value: 460 Ohm
Lead Channel Pacing Threshold Amplitude: 0.5 V
Lead Channel Pacing Threshold Amplitude: 0.75 V
Lead Channel Pacing Threshold Pulse Width: 0.5 ms
Lead Channel Pacing Threshold Pulse Width: 0.5 ms
Lead Channel Sensing Intrinsic Amplitude: 12 mV
Lead Channel Sensing Intrinsic Amplitude: 2.6 mV
Lead Channel Setting Pacing Amplitude: 2 V
Lead Channel Setting Pacing Amplitude: 2.5 V
Lead Channel Setting Pacing Pulse Width: 0.5 ms
Lead Channel Setting Sensing Sensitivity: 2 mV
Pulse Gen Model: 2272
Pulse Gen Serial Number: 3927571

## 2022-02-16 ENCOUNTER — Encounter: Payer: Self-pay | Admitting: Cardiology

## 2022-02-16 ENCOUNTER — Ambulatory Visit (INDEPENDENT_AMBULATORY_CARE_PROVIDER_SITE_OTHER): Payer: Medicare HMO | Admitting: Cardiology

## 2022-02-16 ENCOUNTER — Other Ambulatory Visit: Payer: Self-pay

## 2022-02-16 VITALS — BP 152/80 | HR 58 | Ht 66.0 in | Wt 138.2 lb

## 2022-02-16 DIAGNOSIS — I495 Sick sinus syndrome: Secondary | ICD-10-CM

## 2022-02-16 DIAGNOSIS — I480 Paroxysmal atrial fibrillation: Secondary | ICD-10-CM | POA: Diagnosis not present

## 2022-02-16 DIAGNOSIS — Z79899 Other long term (current) drug therapy: Secondary | ICD-10-CM | POA: Diagnosis not present

## 2022-02-16 NOTE — Patient Instructions (Addendum)
Medication Instructions:  ?Your physician recommends that you continue on your current medications as directed. Please refer to the Current Medication list given to you today. ? ?*If you need a refill on your cardiac medications before your next appointment, please call your pharmacy* ? ? ?Lab Work: ?None ordered ? ? ?Testing/Procedures: ?None ordered ? ? ?Follow-Up: ?At [facility], you and your health needs are our priority.  As part of our continuing mission to provide you with exceptional heart care, we have created designated Provider Care Teams.  These Care Teams include your primary Cardiologist (physician) and Advanced Practice Providers (APPs -  Physician Assistants and Nurse Practitioners) who all work together to provide you with the care you need, when you need it. ? ?Remote monitoring is used to monitor your Pacemaker or ICD from home. This monitoring reduces the number of office visits required to check your device to one time per year. It allows [place] to keep an eye on the functioning of your device to ensure it is working properly. You are scheduled for a device check from home on 05/16/2022. You may send your transmission at any time that day. If you have a wireless device, the transmission will be sent automatically. After your physician reviews your transmission, you will receive a postcard with your next transmission date. ? ?Your next appointment:   ?6 month(s) ? ?The format for your next appointment:   ?In Person ? ?Provider:   ?You will see one of the following Advanced Practice Providers on your designated Care Team:   ?Tommye Standard, PA-C ?Legrand Como "Jonni Sanger" [place], PA-C ? ? ? ?Thank you for choosing [facility]!! ? ? ?Trinidad Curet, RN ?([phone] ? ?

## 2022-02-16 NOTE — Progress Notes (Signed)
? ?Electrophysiology Office Note ? ? ?Date:  02/16/2022  ? ?ID:  Amanda Olsen, DOB [birth year], MRN [id number] ? ?PCP:  [name], DO  ?Cardiologist:  Johnsie Cancel ?Primary Electrophysiologist:  Masao Junker Meredith Leeds, MD   ? ?No chief complaint on file. ? ? ?  ?History of Present Illness: ?Amanda Olsen is a 88 y.o. female who is being seen today for the evaluation of atrial fibrillation at the request of [name], DO. Presenting today for electrophysiology evaluation.   ? ?She has a history significant for persistent atrial fibrillation and hypertension.  She was initially on dofetilide but continued to have episodes of atrial fibrillation with switch to amiodarone.  She wore a cardiac monitor that showed significant episodes of bradycardia due to sick sinus syndrome.  She is status post Abbott dual-chamber pacemaker planted 05/13/2021. ? ?Today, denies symptoms of palpitations, chest pain, shortness of breath, orthopnea, PND, lower extremity edema, claudication, dizziness, presyncope, syncope, bleeding, or neurologic sequela. The patient is tolerating medications without difficulties.  Since her pacemaker was implanted she has done quite well.  She states that she has quite a bit more energy and less shortness of breath.  She is overall happy with how she has been feeling. ? ?Past Medical History:  ?Diagnosis Date  ? Atrial fibrillation ([place])   ? mostly in SR on Tikosyn  ? Cataract   ? HTN (hypertension)   ? Rosacea   ? ?Past Surgical History:  ?Procedure Laterality Date  ? PACEMAKER IMPLANT N/A 05/13/2021  ? Procedure: PACEMAKER IMPLANT;  Surgeon: [name], MD;  Location: [place] [facility];  Service: Cardiovascular;  Laterality: N/A;  ? VARICOSE VEIN SURGERY    ? ? ? ?Current Outpatient Medications  ?Medication Sig Dispense Refill  ? amiodarone (PACERONE) 200 MG tablet TAKE 1/2 TABLET BY MOUTH DAILY 45 tablet 3  ? apixaban (ELIQUIS) 2.5 MG TABS tablet Take 1 tablet (2.5 mg total) by mouth 2 (two)  times daily. 60 tablet 5  ? Cholecalciferol (VITAMIN D-3) 25 MCG (1000 UT) CAPS Take 1,000 Units by mouth daily.     ? diltiazem (CARDIZEM) 30 MG tablet Take 1/2-1 tablet every 4 hours AS NEEDED for heart rate >100 (Patient taking differently: Take 15-30 mg by mouth See admin instructions. Take 15-30 mg tablet every 4 hours AS NEEDED for heart rate >100) 45 tablet 1  ? diltiazem (CARTIA XT) 240 MG 24 hr capsule Take 1 capsule (240 mg total) by mouth daily. 30 capsule 11  ? levothyroxine (SYNTHROID) 25 MCG tablet Take 1 tablet by mouth daily.    ? lisinopril (ZESTRIL) 20 MG tablet Take 1 tablet (20 mg total) by mouth 2 (two) times daily. 180 tablet 2  ? Magnesium Oxide 400 MG CAPS Take 1 capsule (400 mg total) by mouth daily. Nature Made Brand  0  ? potassium chloride SA (KLOR-CON M20) 20 MEQ tablet Take 0.5 tablets (10 mEq total) by mouth 2 (two) times daily. 90 tablet 3  ? vitamin B-12 (CYANOCOBALAMIN) 100 MCG tablet Take 100 mcg by mouth daily.    ? Polyethyl Glycol-Propyl Glycol (SYSTANE HYDRATION PF OP) Place 1 drop into both eyes 3 (three) times daily as needed (for dryness).    ? ?No current facility-administered medications for this visit.  ? ? ?Allergies:   Other  ? ?Social History:  The patient  reports that she has never smoked. She has never used smokeless tobacco. She reports that she does not drink alcohol and does not  use drugs.  ? ?Family History:  The patient's family history includes Heart Problems in her mother.  ? ?ROS:  Please see the history of present illness.   Otherwise, review of systems is positive for none.   All other systems are reviewed and negative.  ? ?PHYSICAL EXAM: ?VS:  BP (!) 152/80   Pulse (!) 58   Ht _0  (1.676 m)   Wt 138 lb 3.2 oz (62.7 kg)   SpO2 97%   BMI 22.31 kg/m?  , BMI Body mass index is 22.31 kg/m?. ?GEN: Well nourished, well developed, in no acute distress  ?HEENT: normal  ?Neck: no JVD, carotid bruits, or masses ?Cardiac: RRR; no murmurs, rubs, or gallops,no  edema  ?Respiratory:  clear to auscultation bilaterally, normal work of breathing ?GI: soft, nontender, nondistended, + BS ?MS: no deformity or atrophy  ?Skin: warm and dry, device site well healed ?Neuro:  Strength and sensation are intact ?Psych: euthymic mood, full affect ? ?EKG:  EKG is ordered today. ?Personal review of the ekg ordered shows sinus rhythm, rate 58, PAC ? ?Personal review of the device interrogation today. Results in [place]  ? ?Recent Labs: ?03/17/2021: ALT 18; TSH 24.498 ?05/12/2021: BUN 16; Creatinine, Ser 0.82; Hemoglobin 12.2; Platelets 178; Potassium 4.3; Sodium 136  ? ? ?Lipid Panel  ?   ?Component Value Date/Time  ? CHOL  07/02/2008 0425  ?  130        ?ATP III CLASSIFICATION: ? <200     mg/dL   Desirable ? 200-239  mg/dL   Borderline High ? >=240    mg/dL   High  ? TRIG 87 07/02/2008 0425  ? HDL 42 07/02/2008 0425  ? CHOLHDL 3.1 07/02/2008 0425  ? VLDL 17 07/02/2008 0425  ? [place]  07/02/2008 0425  ?  71        ?Total Cholesterol/HDL:CHD Risk ?Coronary Heart Disease Risk Table ?                    Men   Women ? 1/2 Average Risk   3.4   3.3  ? ? ? ?Wt Readings from Last 3 Encounters:  ?02/16/22 138 lb 3.2 oz (62.7 kg)  ?08/18/21 131 lb (59.4 kg)  ?05/13/21 123 lb (55.8 kg)  ?  ? ? ?Other studies Reviewed: ?Additional studies/ records that were reviewed today include: TTE 05/05/2020 ?Review of the above records today demonstrates:  ? 1. Left ventricular ejection fraction, by estimation, is 50 to 55%. The  ?left ventricle has low normal function. The left ventricle has no regional  ?wall motion abnormalities. Left ventricular diastolic parameters are  ?consistent with Grade I diastolic  ?dysfunction (impaired relaxation).  ? 2. Right ventricular systolic function is normal. The right ventricular  ?size is normal. There is normal pulmonary artery systolic pressure. The  ?estimated right ventricular systolic pressure is 16.1 mmHg.  ? 3. The mitral valve is grossly normal. No evidence of mitral  valve  ?regurgitation. No evidence of mitral stenosis.  ? 4. The aortic valve is tricuspid. Aortic valve regurgitation is not  ?visualized. Mild aortic valve sclerosis is present, with no evidence of  ?aortic valve stenosis.  ? 5. The inferior vena cava is normal in size with greater than 50%  ?respiratory variability, suggesting right atrial pressure of 3 mmHg.  ? ?Cardiac monitor 03/18/2021 personally reviewed ?Max 182 bpm 06:16pm, 03/24 ?Min 29 bpm 10:55am, 03/29 ?Avg 50 bpm ?1.5% supraventricular ectopy ?<1% ventricular ectopy ?53 SVT  runs, all less than 15 seconds ?16 pauses, longest 5.8 seconds, 4.2 second pause in the evening hours, longest pauses nocturnal ?No symptoms reported ? ?ASSESSMENT AND PLAN: ? ?1.  Paroxysmal atrial fibrillation: Currently on amiodarone 100 mg daily.  High risk medication monitoring today for amiodarone with labs performed today.  CHA2DS2-VASc of 4.  She is on Eliquis 5 mg twice daily.  Remains in sinus rhythm.  No changes. ? ?2.  Hypertension: Elevated today but well controlled at home ? ?3.  Hypothyroidism: Continue Synthroid per primary physician ? ?4.  Sick sinus syndrome: Has had atrial fibrillation with rapid rates as well as bradycardia.  Is status post Abbott dual-chamber pacemaker implanted 05/13/2021.  Device functioning appropriately.  No changes at this time. ? ? ? ?Current medicines are reviewed at length with the patient today.   ?The patient does not have concerns regarding her medicines.  The following changes were made today: None ? ?Labs/ tests ordered today include:  ?Orders Placed This Encounter  ?Procedures  ? Comp Met (CMET)  ? TSH  ? CBC  ? EKG 12-Lead  ? ? ? ? ?Disposition:   FU with [place] Echavarria 6 months ? ?Signed, ?Antario Yasuda Meredith Leeds, MD  ?02/16/2022 3:06 PM    ? ?[facility] ?[address] ?[address] ?[place] [address] ?[phone] (office) ?[phone] (fax) ?

## 2022-02-20 DIAGNOSIS — R7309 Other abnormal glucose: Secondary | ICD-10-CM | POA: Diagnosis not present

## 2022-02-20 DIAGNOSIS — Z0000 Encounter for general adult medical examination without abnormal findings: Secondary | ICD-10-CM | POA: Diagnosis not present

## 2022-02-20 DIAGNOSIS — Z950 Presence of cardiac pacemaker: Secondary | ICD-10-CM | POA: Diagnosis not present

## 2022-02-20 DIAGNOSIS — I10 Essential (primary) hypertension: Secondary | ICD-10-CM | POA: Diagnosis not present

## 2022-02-20 DIAGNOSIS — I480 Paroxysmal atrial fibrillation: Secondary | ICD-10-CM | POA: Diagnosis not present

## 2022-02-20 DIAGNOSIS — E039 Hypothyroidism, unspecified: Secondary | ICD-10-CM | POA: Diagnosis not present

## 2022-02-20 DIAGNOSIS — E559 Vitamin D deficiency, unspecified: Secondary | ICD-10-CM | POA: Diagnosis not present

## 2022-02-20 DIAGNOSIS — I495 Sick sinus syndrome: Secondary | ICD-10-CM | POA: Diagnosis not present

## 2022-02-27 NOTE — Progress Notes (Signed)
Remote pacemaker transmission.   

## 2022-03-06 ENCOUNTER — Other Ambulatory Visit (HOSPITAL_COMMUNITY): Payer: Self-pay | Admitting: Nurse Practitioner

## 2022-03-08 IMAGING — CR DG CHEST 2V
2 series · 2 of 2 positions shown · non-contrast
Comparison: 01/09/2021

CLINICAL DATA: Follow-up pacemaker placement

EXAM:
CHEST - 2 VIEW

[w chest pa]
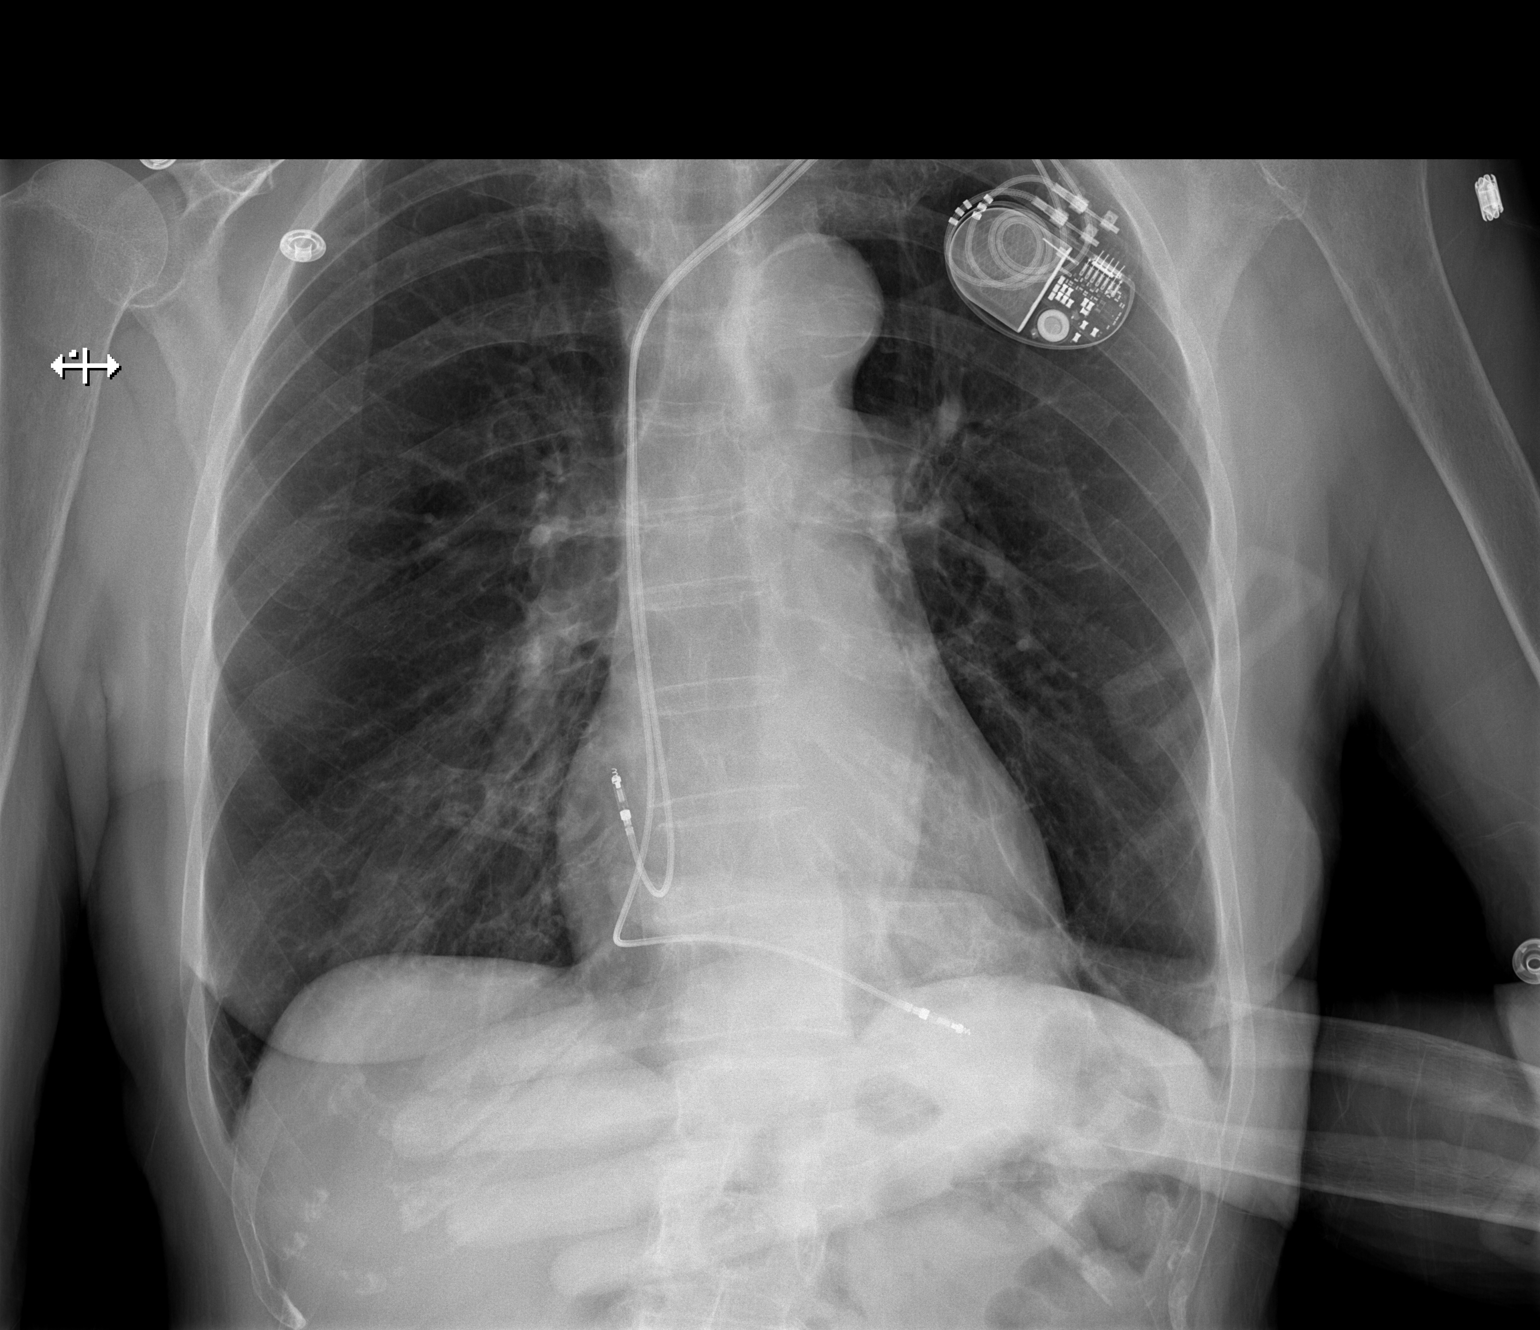

[w chest lat]
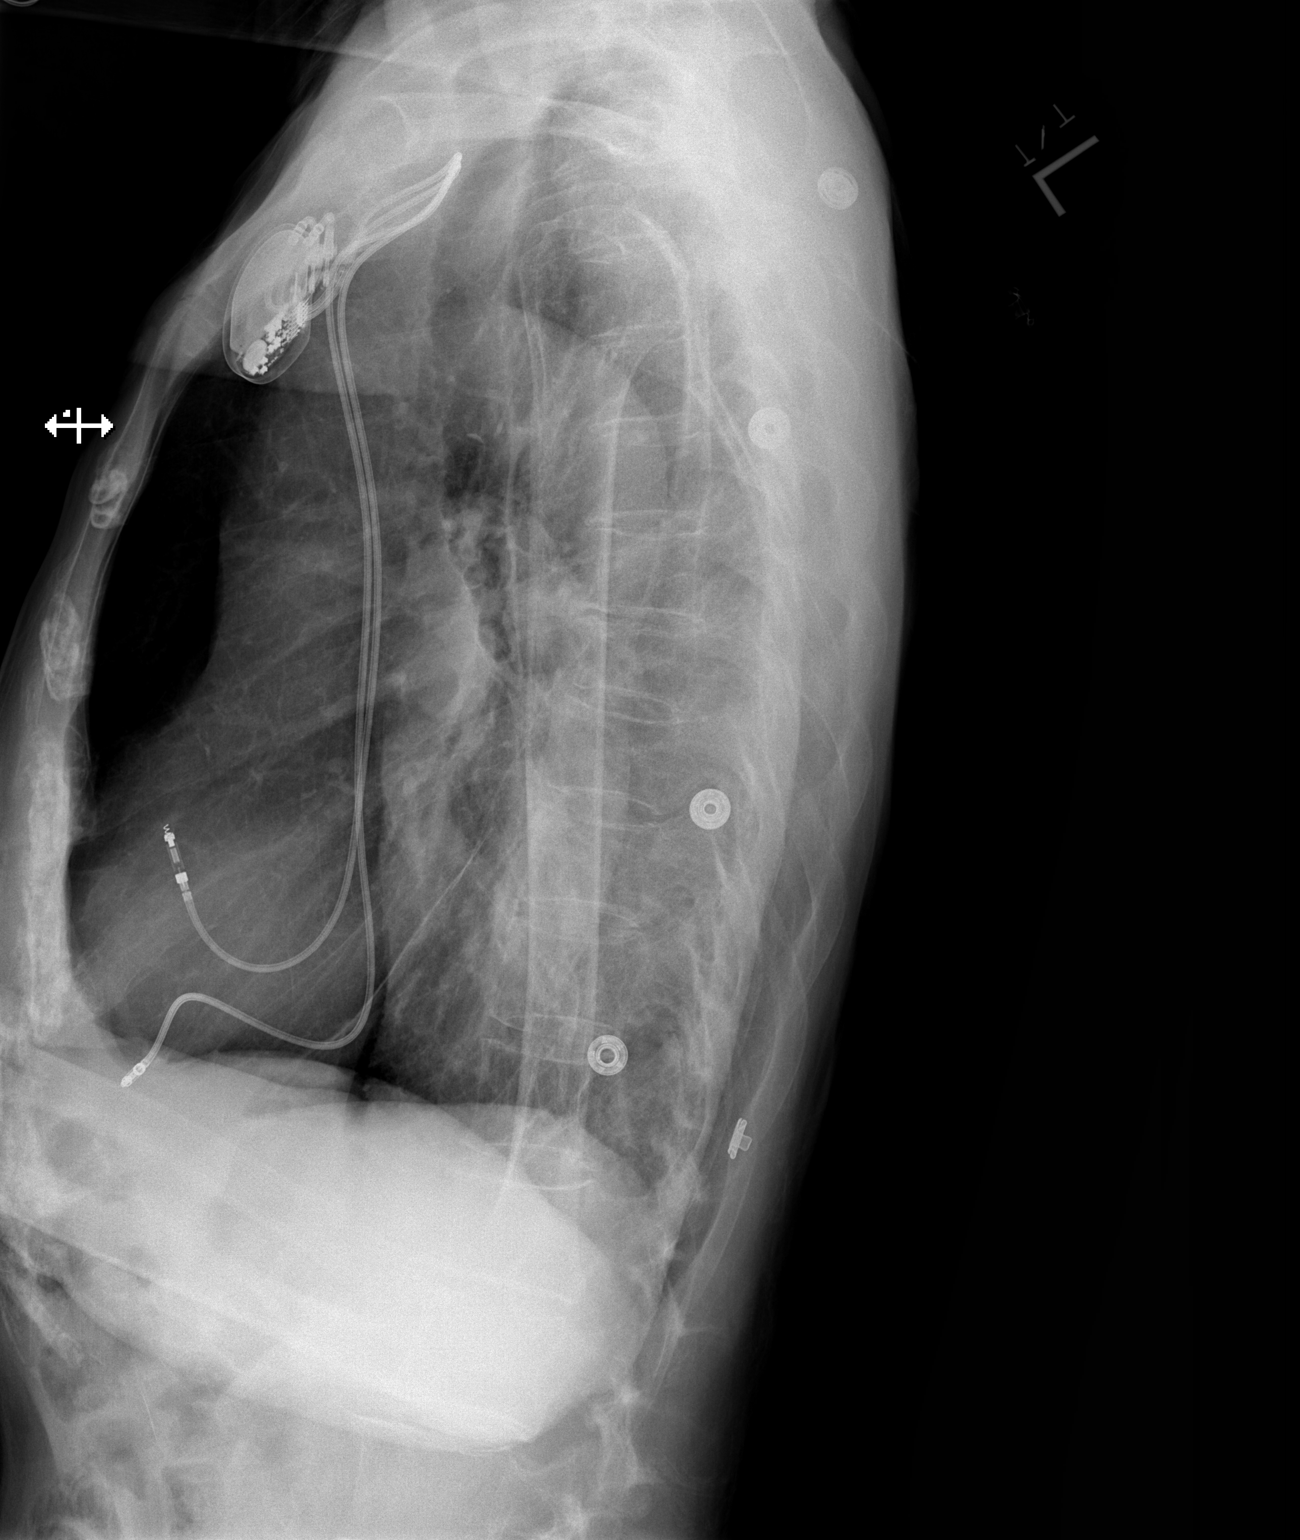

[2 of 2 positions shown; findings below may reference images not displayed]

FINDINGS: Dual lead pacemaker placed from a left subclavian approach. Leads in
the region of the right atrium and right ventricle. No complicating
feature. No pneumothorax. Lungs remain clear. No edema or effusion.
IMPRESSION: Pacemaker grossly well position with leads in the region of the
right atrium and right ventricle. No pneumothorax or other acute
finding.

## 2022-03-14 ENCOUNTER — Other Ambulatory Visit: Payer: Self-pay | Admitting: Cardiology

## 2022-05-05 ENCOUNTER — Telehealth: Payer: Self-pay | Admitting: *Deleted

## 2022-05-05 DIAGNOSIS — I480 Paroxysmal atrial fibrillation: Secondary | ICD-10-CM

## 2022-05-05 MED ORDER — APIXABAN 2.5 MG PO TABS: 2.5000 mg | ORAL_TABLET | Freq: Two times a day (BID) | ORAL | 1 refills | Status: DC

## 2022-05-05 NOTE — Telephone Encounter (Addendum)
Eliquis 2.5mg  paper refill request received. Patient is 88 years old, weight-62.7kg, Crea-0.82 on 05/12/2021, Diagnosis-, and last seen by Dr. Elberta Fortis on 02/16/2022.  11/09/21 pt was on correct dose of 2.5mg  BID as wt was 59.4mg  & age 48 yrs old. Dose approved per pharmacist,  to send but will need a new weight for further refills to ensure correct dosing

## 2022-05-16 ENCOUNTER — Ambulatory Visit (INDEPENDENT_AMBULATORY_CARE_PROVIDER_SITE_OTHER): Payer: Medicare HMO

## 2022-05-16 DIAGNOSIS — I495 Sick sinus syndrome: Secondary | ICD-10-CM | POA: Diagnosis not present

## 2022-05-16 LAB — CUP PACEART REMOTE DEVICE CHECK
Battery Remaining Longevity: 113 mo
Battery Remaining Percentage: 94 %
Battery Voltage: 3.02 V
Brady Statistic AP VP Percent: 1 %
Brady Statistic AP VS Percent: 74 %
Brady Statistic AS VP Percent: 1 %
Brady Statistic AS VS Percent: 26 %
Brady Statistic RA Percent Paced: 72 %
Brady Statistic RV Percent Paced: 1 %
Date Time Interrogation Session: 20230606051043
Implantable Lead Implant Date: 20220603
Implantable Lead Implant Date: 20220603
Implantable Lead Location: 753860
Implantable Lead Location: 753860
Implantable Pulse Generator Implant Date: 20220603
Lead Channel Impedance Value: 410 Ohm
Lead Channel Impedance Value: 440 Ohm
Lead Channel Pacing Threshold Amplitude: 0.75 V
Lead Channel Pacing Threshold Amplitude: 0.75 V
Lead Channel Pacing Threshold Pulse Width: 0.5 ms
Lead Channel Pacing Threshold Pulse Width: 0.5 ms
Lead Channel Sensing Intrinsic Amplitude: 11.5 mV
Lead Channel Sensing Intrinsic Amplitude: 3.5 mV
Lead Channel Setting Pacing Amplitude: 2 V
Lead Channel Setting Pacing Amplitude: 2.5 V
Lead Channel Setting Pacing Pulse Width: 0.5 ms
Lead Channel Setting Sensing Sensitivity: 2 mV
Pulse Gen Model: 2272
Pulse Gen Serial Number: 3927571

## 2022-05-31 NOTE — Progress Notes (Signed)
Remote pacemaker transmission.   

## 2022-06-15 ENCOUNTER — Other Ambulatory Visit (HOSPITAL_COMMUNITY): Payer: Self-pay | Admitting: Cardiology

## 2022-07-03 ENCOUNTER — Other Ambulatory Visit: Payer: Self-pay

## 2022-07-03 DIAGNOSIS — I480 Paroxysmal atrial fibrillation: Secondary | ICD-10-CM

## 2022-07-03 MED ORDER — APIXABAN 2.5 MG PO TABS: 2.5000 mg | ORAL_TABLET | Freq: Two times a day (BID) | ORAL | 0 refills | Status: DC

## 2022-07-03 NOTE — Telephone Encounter (Signed)
Prescription refill request for Eliquis received. Indication:Afib Last office visit:3/23 XFQ:HK[facility] Age: 88 Weight:62.7 kg  Prescription refilled

## 2022-07-30 ENCOUNTER — Other Ambulatory Visit: Payer: Self-pay | Admitting: Cardiology

## 2022-07-30 DIAGNOSIS — I480 Paroxysmal atrial fibrillation: Secondary | ICD-10-CM

## 2022-07-31 NOTE — Telephone Encounter (Signed)
Prescription refill request for Eliquis received. Indication: AF  Last office visit: 02/16/22  Carleene Mains MD Scr: 0.82 on 05/12/21  Age: 88 Weight: 62.7kg  Pt is past due for lab work.  Requested labs be done.  Dose needs to be evaluated once labs are back. Refill approved x 14 tablets only.

## 2022-08-04 ENCOUNTER — Other Ambulatory Visit: Payer: Self-pay | Admitting: Cardiology

## 2022-08-04 ENCOUNTER — Encounter: Payer: Self-pay | Admitting: Cardiology

## 2022-08-04 DIAGNOSIS — I480 Paroxysmal atrial fibrillation: Secondary | ICD-10-CM

## 2022-08-04 MED ORDER — APIXABAN 2.5 MG PO TABS: ORAL_TABLET | ORAL | 0 refills | Status: DC

## 2022-08-04 NOTE — Telephone Encounter (Signed)
Prescription refill request for Eliquis received. Indication:Afib Last office visit:3/23 BMZ:TAEWY labs Age: 88 Weight:62.7 kg  Prescription refilled

## 2022-08-15 ENCOUNTER — Ambulatory Visit (INDEPENDENT_AMBULATORY_CARE_PROVIDER_SITE_OTHER): Payer: Medicare HMO

## 2022-08-15 DIAGNOSIS — I495 Sick sinus syndrome: Secondary | ICD-10-CM

## 2022-08-15 LAB — CUP PACEART REMOTE DEVICE CHECK
Battery Remaining Longevity: 111 mo
Battery Remaining Percentage: 92 %
Battery Voltage: 3.02 V
Brady Statistic AP VP Percent: 1 %
Brady Statistic AP VS Percent: 73 %
Brady Statistic AS VP Percent: 1 %
Brady Statistic AS VS Percent: 26 %
Brady Statistic RA Percent Paced: 71 %
Brady Statistic RV Percent Paced: 1 %
Date Time Interrogation Session: 20230905020019
Implantable Lead Implant Date: 20220603
Implantable Lead Implant Date: 20220603
Implantable Lead Location: 753860
Implantable Lead Location: 753860
Implantable Pulse Generator Implant Date: 20220603
Lead Channel Impedance Value: 430 Ohm
Lead Channel Impedance Value: 450 Ohm
Lead Channel Pacing Threshold Amplitude: 0.75 V
Lead Channel Pacing Threshold Amplitude: 0.75 V
Lead Channel Pacing Threshold Pulse Width: 0.5 ms
Lead Channel Pacing Threshold Pulse Width: 0.5 ms
Lead Channel Sensing Intrinsic Amplitude: 12 mV
Lead Channel Sensing Intrinsic Amplitude: 3.1 mV
Lead Channel Setting Pacing Amplitude: 2 V
Lead Channel Setting Pacing Amplitude: 2.5 V
Lead Channel Setting Pacing Pulse Width: 0.5 ms
Lead Channel Setting Sensing Sensitivity: 2 mV
Pulse Gen Model: 2272
Pulse Gen Serial Number: 3927571

## 2022-08-16 DIAGNOSIS — E7800 Pure hypercholesterolemia, unspecified: Secondary | ICD-10-CM | POA: Diagnosis not present

## 2022-08-16 DIAGNOSIS — R7309 Other abnormal glucose: Secondary | ICD-10-CM | POA: Diagnosis not present

## 2022-08-16 DIAGNOSIS — I10 Essential (primary) hypertension: Secondary | ICD-10-CM | POA: Diagnosis not present

## 2022-08-16 DIAGNOSIS — E039 Hypothyroidism, unspecified: Secondary | ICD-10-CM | POA: Diagnosis not present

## 2022-08-23 DIAGNOSIS — E7800 Pure hypercholesterolemia, unspecified: Secondary | ICD-10-CM | POA: Diagnosis not present

## 2022-08-23 DIAGNOSIS — I480 Paroxysmal atrial fibrillation: Secondary | ICD-10-CM | POA: Diagnosis not present

## 2022-08-23 DIAGNOSIS — I495 Sick sinus syndrome: Secondary | ICD-10-CM | POA: Diagnosis not present

## 2022-08-23 DIAGNOSIS — E559 Vitamin D deficiency, unspecified: Secondary | ICD-10-CM | POA: Diagnosis not present

## 2022-08-23 DIAGNOSIS — R7301 Impaired fasting glucose: Secondary | ICD-10-CM | POA: Diagnosis not present

## 2022-08-23 DIAGNOSIS — E039 Hypothyroidism, unspecified: Secondary | ICD-10-CM | POA: Diagnosis not present

## 2022-08-23 DIAGNOSIS — Z950 Presence of cardiac pacemaker: Secondary | ICD-10-CM | POA: Diagnosis not present

## 2022-08-28 ENCOUNTER — Other Ambulatory Visit: Payer: Self-pay | Admitting: *Deleted

## 2022-08-28 MED ORDER — AMIODARONE HCL 200 MG PO TABS: 100.0000 mg | ORAL_TABLET | Freq: Every day | ORAL | 1 refills | Status: DC

## 2022-09-02 ENCOUNTER — Other Ambulatory Visit: Payer: Self-pay | Admitting: Cardiology

## 2022-09-02 DIAGNOSIS — I480 Paroxysmal atrial fibrillation: Secondary | ICD-10-CM

## 2022-09-04 DIAGNOSIS — I480 Paroxysmal atrial fibrillation: Secondary | ICD-10-CM

## 2022-09-04 NOTE — Telephone Encounter (Signed)
This encounter was created in error - please disregard.

## 2022-09-04 NOTE — Telephone Encounter (Signed)
Prescription refill request for Eliquis received. Indication: afib  Last office visit: 02/16/2022 Scr: 0.82, 05/12/2021 Age: 88 Weight: 62.7 kg  Pt is overdue for blood work. Depending on lab results pt possibly qualifies for a dose change.

## 2022-09-04 NOTE — Telephone Encounter (Signed)
Called and spoke with PCP. Labs were drawn on September 6th, 2023. Requested labs to be faxed to Anticoagulation clinic.

## 2022-09-05 ENCOUNTER — Telehealth: Payer: Self-pay

## 2022-09-05 DIAGNOSIS — I480 Paroxysmal atrial fibrillation: Secondary | ICD-10-CM

## 2022-09-05 MED ORDER — APIXABAN 2.5 MG PO TABS: ORAL_TABLET | ORAL | 5 refills | Status: DC

## 2022-09-05 NOTE — Telephone Encounter (Signed)
done

## 2022-09-08 NOTE — Progress Notes (Signed)
Remote pacemaker transmission.   

## 2022-11-14 ENCOUNTER — Ambulatory Visit (INDEPENDENT_AMBULATORY_CARE_PROVIDER_SITE_OTHER): Payer: Medicare HMO

## 2022-11-14 DIAGNOSIS — I495 Sick sinus syndrome: Secondary | ICD-10-CM | POA: Diagnosis not present

## 2022-11-14 LAB — CUP PACEART REMOTE DEVICE CHECK
Battery Remaining Longevity: 109 mo
Battery Remaining Percentage: 90 %
Battery Voltage: 3.02 V
Brady Statistic AP VP Percent: 1 %
Brady Statistic AP VS Percent: 70 %
Brady Statistic AS VP Percent: 1 %
Brady Statistic AS VS Percent: 29 %
Brady Statistic RA Percent Paced: 68 %
Brady Statistic RV Percent Paced: 1 %
Date Time Interrogation Session: 20231205020014
Implantable Lead Connection Status: 753985
Implantable Lead Connection Status: 753985
Implantable Lead Implant Date: 20220603
Implantable Lead Implant Date: 20220603
Implantable Lead Location: 753860
Implantable Lead Location: 753860
Implantable Pulse Generator Implant Date: 20220603
Lead Channel Impedance Value: 430 Ohm
Lead Channel Impedance Value: 430 Ohm
Lead Channel Pacing Threshold Amplitude: 0.75 V
Lead Channel Pacing Threshold Amplitude: 0.75 V
Lead Channel Pacing Threshold Pulse Width: 0.5 ms
Lead Channel Pacing Threshold Pulse Width: 0.5 ms
Lead Channel Sensing Intrinsic Amplitude: 12 mV
Lead Channel Sensing Intrinsic Amplitude: 3.1 mV
Lead Channel Setting Pacing Amplitude: 2 V
Lead Channel Setting Pacing Amplitude: 2.5 V
Lead Channel Setting Pacing Pulse Width: 0.5 ms
Lead Channel Setting Sensing Sensitivity: 2 mV
Pulse Gen Model: 2272
Pulse Gen Serial Number: 3927571

## 2022-11-28 ENCOUNTER — Other Ambulatory Visit: Payer: Self-pay

## 2022-11-28 MED ORDER — LISINOPRIL 20 MG PO TABS: 20.0000 mg | ORAL_TABLET | Freq: Two times a day (BID) | ORAL | 0 refills | Status: DC

## 2022-12-12 NOTE — Progress Notes (Signed)
Remote pacemaker transmission.   

## 2022-12-20 ENCOUNTER — Encounter: Payer: Self-pay | Admitting: Cardiology

## 2022-12-20 ENCOUNTER — Ambulatory Visit: Payer: Medicare HMO | Attending: Cardiology | Admitting: Cardiology

## 2022-12-20 VITALS — BP 150/86 | HR 53 | Ht 66.0 in | Wt 146.0 lb

## 2022-12-20 DIAGNOSIS — D6869 Other thrombophilia: Secondary | ICD-10-CM | POA: Diagnosis not present

## 2022-12-20 DIAGNOSIS — Z79899 Other long term (current) drug therapy: Secondary | ICD-10-CM

## 2022-12-20 DIAGNOSIS — I4819 Other persistent atrial fibrillation: Secondary | ICD-10-CM | POA: Diagnosis not present

## 2022-12-20 DIAGNOSIS — Z950 Presence of cardiac pacemaker: Secondary | ICD-10-CM | POA: Diagnosis not present

## 2022-12-20 DIAGNOSIS — I495 Sick sinus syndrome: Secondary | ICD-10-CM

## 2022-12-20 NOTE — Progress Notes (Signed)
Electrophysiology Office Note   Date:  12/20/2022   ID:  Amanda Olsen, DOB 07-25-1934, MRN [id number]  PCP:  Amanda Morning, DO  Cardiologist:  Amanda Olsen Primary Electrophysiologist:  Amanda Kappes Meredith Leeds, MD    No chief complaint on file.     History of Present Illness: Amanda Olsen is a 89 y.o. female who is being seen today for the evaluation of atrial fibrillation at the request of Amanda Morning, DO. Presenting today for electrophysiology evaluation.    She has a history significant persistent atrial fibrillation, hypertension, sick sinus syndrome.  She was initially on dofetilide but had more episodes of atrial fibrillation and was switched to amiodarone.  She had a cardiac monitor that showed significant episodes of bradycardia due to sick sinus syndrome.  She is post Abbott dual-chamber pacemaker planted 05/13/2021.  Today, denies symptoms of palpitations, chest pain, shortness of breath, orthopnea, PND, lower extremity edema, claudication, dizziness, presyncope, syncope, bleeding, or neurologic sequela. The patient is tolerating medications without difficulties.  Since being seen she has done well.  She has had no chest pain or shortness of breath.  She has noted no further episodes of atrial fibrillation.   Past Medical History:  Diagnosis Date   Atrial fibrillation ([place])    mostly in SR on Tikosyn   Cataract    HTN (hypertension)    Rosacea    Past Surgical History:  Procedure Laterality Date   PACEMAKER IMPLANT N/A 05/13/2021   Procedure: PACEMAKER IMPLANT;  Surgeon: Amanda Haw, MD;  Location: [place] [facility];  Service: Cardiovascular;  Laterality: N/A;   VARICOSE VEIN SURGERY       Current Outpatient Medications  Medication Sig Dispense Refill   amiodarone (PACERONE) 200 MG tablet Take 0.5 tablets (100 mg total) by mouth daily. 45 tablet 1   apixaban (ELIQUIS) 2.5 MG TABS tablet Take 1 tablet two times Daily.  Please come to office for Lab Draw.  60 tablet 5   Cholecalciferol (VITAMIN D-3) 25 MCG (1000 UT) CAPS Take 1,000 Units by mouth daily.      diltiazem (CARDIZEM CD) 240 MG 24 hr capsule TAKE ONE CAPSULE BY MOUTH DAILY 90 capsule 3   diltiazem (CARDIZEM) 30 MG tablet Take 1/2-1 tablet every 4 hours AS NEEDED for heart rate >100 (Patient taking differently: Take 15-30 mg by mouth See admin instructions. Take 15-30 mg tablet every 4 hours AS NEEDED for heart rate >100) 45 tablet 1   KLOR-CON M20 20 MEQ tablet TAKE 1/2 TABLET BY MOUTH TWO TIMES A DAY 90 tablet 2   levothyroxine (SYNTHROID) 25 MCG tablet Take 1 tablet by mouth daily.     lisinopril (ZESTRIL) 20 MG tablet Take 1 tablet (20 mg total) by mouth 2 (two) times daily. 180 tablet 0   Magnesium Oxide 400 MG CAPS Take 1 capsule (400 mg total) by mouth daily. Nature Made Brand  0   vitamin B-12 (CYANOCOBALAMIN) 100 MCG tablet Take 100 mcg by mouth daily.     Polyethyl Glycol-Propyl Glycol (SYSTANE HYDRATION PF OP) Place 1 drop into both eyes 3 (three) times daily as needed (for dryness).     No current facility-administered medications for this visit.    Allergies:   Other   Social History:  The patient  reports that she has never smoked. She has never used smokeless tobacco. She reports that she does not drink alcohol and does not use drugs.   Family History:  The patient's family history includes  Heart Problems in her mother.   ROS:  Please see the history of present illness.   Otherwise, review of systems is positive for none.   All other systems are reviewed and negative.   PHYSICAL EXAM: VS:  BP (!) 150/86   Pulse (!) 53   Ht 5\' 6"  (1.676 m)   Wt 146 lb (66.2 kg)   SpO2 97%   BMI 23.57 kg/m  , BMI Body mass index is 23.57 kg/m. GEN: Well nourished, well developed, in no acute distress  HEENT: normal  Neck: no JVD, carotid bruits, or masses Cardiac: RRR; no murmurs, rubs, or gallops,no edema  Respiratory:  clear to auscultation bilaterally, normal work of  breathing GI: soft, nontender, nondistended, + BS MS: no deformity or atrophy  Skin: warm and dry, device site well healed Neuro:  Strength and sensation are intact Psych: euthymic mood, full affect  EKG:  EKG is ordered today. Personal review of the ekg ordered shows sinus rhythm, LBBB  Personal review of the device interrogation today. Results in [place]: No results found for requested labs within last 365 days.    Lipid Panel     Component Value Date/Time   CHOL  07/02/2008 0425    130        ATP III CLASSIFICATION:  <200     mg/dL   Desirable  200-239  mg/dL   Borderline High  >=240    mg/dL   High   TRIG 87 07/02/2008 0425   HDL 42 07/02/2008 0425   CHOLHDL 3.1 07/02/2008 0425   VLDL 17 07/02/2008 0425   LDLCALC  07/02/2008 0425    71        Total Cholesterol/HDL:CHD Risk Coronary Heart Disease Risk Table                     Men   Women  1/2 Average Risk   3.4   3.3     Wt Readings from Last 3 Encounters:  12/20/22 146 lb (66.2 kg)  02/16/22 138 lb 3.2 oz (62.7 kg)  08/18/21 131 lb (59.4 kg)      Other studies Reviewed: Additional studies/ records that were reviewed today include: TTE 05/05/2020 Review of the above records today demonstrates:   1. Left ventricular ejection fraction, by estimation, is 50 to 55%. The  left ventricle has low normal function. The left ventricle has no regional  wall motion abnormalities. Left ventricular diastolic parameters are  consistent with Grade I diastolic  dysfunction (impaired relaxation).   2. Right ventricular systolic function is normal. The right ventricular  size is normal. There is normal pulmonary artery systolic pressure. The  estimated right ventricular systolic pressure is 57.8 mmHg.   3. The mitral valve is grossly normal. No evidence of mitral valve  regurgitation. No evidence of mitral stenosis.   4. The aortic valve is tricuspid. Aortic valve regurgitation is not  visualized. Mild aortic  valve sclerosis is present, with no evidence of  aortic valve stenosis.   5. The inferior vena cava is normal in size with greater than 50%  respiratory variability, suggesting right atrial pressure of 3 mmHg.   Cardiac monitor 03/18/2021 personally reviewed Max 182 bpm 06:16pm, 03/24 Min 29 bpm 10:55am, 03/29 Avg 50 bpm 1.5% supraventricular ectopy <1% ventricular ectopy 53 SVT runs, all less than 15 seconds 16 pauses, longest 5.8 seconds, 4.2 second pause in the evening hours, longest pauses nocturnal No symptoms reported  ASSESSMENT AND PLAN:  1.  Paroxysmal atrial fibrillation: Currently on amiodarone 100 mg daily.  CHA2DS2-VASc of 4.  Eliquis 5 mg twice daily.  Remained in sinus rhythm.  No changes.  2.  Hypertension: Elevated today but usually well-controlled  3.  Sick sinus syndrome: Status post Abbott dual-chamber pacemaker implant 05/13/2021.  Device function appropriate.  No changes.  4.  Secondary hypercoagulable state: Currently on Eliquis for atrial fibrillation as above  5.  High risk medication monitoring: Currently on amiodarone for atrial fibrillation as above.  Recent labs without abnormality.   Current medicines are reviewed at length with the patient today.   The patient does not have concerns regarding her medicines.  The following changes were made today: none  Labs/ tests ordered today include:  Orders Placed This Encounter  Procedures   EKG 12-Lead      Disposition:   FU 6 months  Signed, Ninnie Fein Jorja Loa, MD  12/20/2022 12:44 PM     [facility] [address] [place] [address] [phone] (office) [phone] (fax)

## 2023-01-18 ENCOUNTER — Encounter (HOSPITAL_COMMUNITY): Payer: Self-pay | Admitting: *Deleted

## 2023-02-13 ENCOUNTER — Ambulatory Visit (INDEPENDENT_AMBULATORY_CARE_PROVIDER_SITE_OTHER): Payer: Medicare HMO

## 2023-02-13 DIAGNOSIS — I495 Sick sinus syndrome: Secondary | ICD-10-CM

## 2023-02-13 LAB — CUP PACEART REMOTE DEVICE CHECK
Battery Remaining Longevity: 107 mo
Battery Remaining Percentage: 88 %
Battery Voltage: 3.02 V
Brady Statistic AP VP Percent: 1 %
Brady Statistic AP VS Percent: 63 %
Brady Statistic AS VP Percent: 1 %
Brady Statistic AS VS Percent: 37 %
Brady Statistic RA Percent Paced: 61 %
Brady Statistic RV Percent Paced: 1 %
Date Time Interrogation Session: 20240305020013
Implantable Lead Connection Status: 753985
Implantable Lead Connection Status: 753985
Implantable Lead Implant Date: 20220603
Implantable Lead Implant Date: 20220603
Implantable Lead Location: 753860
Implantable Lead Location: 753860
Implantable Pulse Generator Implant Date: 20220603
Lead Channel Impedance Value: 430 Ohm
Lead Channel Impedance Value: 450 Ohm
Lead Channel Pacing Threshold Amplitude: 0.75 V
Lead Channel Pacing Threshold Amplitude: 0.75 V
Lead Channel Pacing Threshold Pulse Width: 0.5 ms
Lead Channel Pacing Threshold Pulse Width: 0.5 ms
Lead Channel Sensing Intrinsic Amplitude: 12 mV
Lead Channel Sensing Intrinsic Amplitude: 2.7 mV
Lead Channel Setting Pacing Amplitude: 2 V
Lead Channel Setting Pacing Amplitude: 2.5 V
Lead Channel Setting Pacing Pulse Width: 0.5 ms
Lead Channel Setting Sensing Sensitivity: 2 mV
Pulse Gen Model: 2272
Pulse Gen Serial Number: 3927571

## 2023-02-19 DIAGNOSIS — E7800 Pure hypercholesterolemia, unspecified: Secondary | ICD-10-CM | POA: Diagnosis not present

## 2023-02-19 DIAGNOSIS — E039 Hypothyroidism, unspecified: Secondary | ICD-10-CM | POA: Diagnosis not present

## 2023-02-19 DIAGNOSIS — E559 Vitamin D deficiency, unspecified: Secondary | ICD-10-CM | POA: Diagnosis not present

## 2023-02-19 DIAGNOSIS — Z950 Presence of cardiac pacemaker: Secondary | ICD-10-CM | POA: Diagnosis not present

## 2023-02-19 DIAGNOSIS — I480 Paroxysmal atrial fibrillation: Secondary | ICD-10-CM | POA: Diagnosis not present

## 2023-02-19 DIAGNOSIS — R7301 Impaired fasting glucose: Secondary | ICD-10-CM | POA: Diagnosis not present

## 2023-02-22 ENCOUNTER — Other Ambulatory Visit: Payer: Self-pay

## 2023-02-22 MED ORDER — AMIODARONE HCL 200 MG PO TABS: 100.0000 mg | ORAL_TABLET | Freq: Every day | ORAL | 3 refills | Status: DC

## 2023-02-23 ENCOUNTER — Other Ambulatory Visit: Payer: Self-pay

## 2023-02-23 MED ORDER — LISINOPRIL 20 MG PO TABS: 20.0000 mg | ORAL_TABLET | Freq: Two times a day (BID) | ORAL | 3 refills | Status: DC

## 2023-02-26 DIAGNOSIS — N1831 Chronic kidney disease, stage 3a: Secondary | ICD-10-CM | POA: Diagnosis not present

## 2023-02-26 DIAGNOSIS — Z0000 Encounter for general adult medical examination without abnormal findings: Secondary | ICD-10-CM | POA: Diagnosis not present

## 2023-02-26 DIAGNOSIS — I495 Sick sinus syndrome: Secondary | ICD-10-CM | POA: Diagnosis not present

## 2023-02-26 DIAGNOSIS — E559 Vitamin D deficiency, unspecified: Secondary | ICD-10-CM | POA: Diagnosis not present

## 2023-02-26 DIAGNOSIS — R7309 Other abnormal glucose: Secondary | ICD-10-CM | POA: Diagnosis not present

## 2023-02-26 DIAGNOSIS — I129 Hypertensive chronic kidney disease with stage 1 through stage 4 chronic kidney disease, or unspecified chronic kidney disease: Secondary | ICD-10-CM | POA: Diagnosis not present

## 2023-02-26 DIAGNOSIS — Z950 Presence of cardiac pacemaker: Secondary | ICD-10-CM | POA: Diagnosis not present

## 2023-02-26 DIAGNOSIS — E039 Hypothyroidism, unspecified: Secondary | ICD-10-CM | POA: Diagnosis not present

## 2023-02-26 DIAGNOSIS — I480 Paroxysmal atrial fibrillation: Secondary | ICD-10-CM | POA: Diagnosis not present

## 2023-03-01 ENCOUNTER — Other Ambulatory Visit: Payer: Self-pay | Admitting: Cardiology

## 2023-03-01 DIAGNOSIS — I480 Paroxysmal atrial fibrillation: Secondary | ICD-10-CM

## 2023-03-01 NOTE — Telephone Encounter (Signed)
Prescription refill request for Eliquis received. Indication: Afib  Last office visit: 12/20/22 ([place])  Scr: 1.02 (08/16/22)  Age: 89 Weight: 66.2kg  Per Dr Macky Lower note on 12/20/22: "Paroxysmal atrial fibrillation: Currently on amiodarone 100 mg daily. CHA2DS2-VASc of 4. Eliquis 5 mg twice daily"  Prescription changed to Eliquis 5mg  BID. Refill sent.

## 2023-03-06 ENCOUNTER — Other Ambulatory Visit: Payer: Self-pay

## 2023-03-06 MED ORDER — DILTIAZEM HCL ER COATED BEADS 240 MG PO CP24: 240.0000 mg | ORAL_CAPSULE | Freq: Every day | ORAL | 3 refills | Status: DC

## 2023-03-08 ENCOUNTER — Other Ambulatory Visit (HOSPITAL_COMMUNITY): Payer: Self-pay | Admitting: Cardiology

## 2023-03-23 NOTE — Progress Notes (Signed)
Remote pacemaker transmission.   

## 2023-04-27 ENCOUNTER — Encounter: Payer: Self-pay | Admitting: Cardiology

## 2023-04-27 NOTE — Telephone Encounter (Addendum)
Pt aware that I am discussing with MD next week and will let her know.  Spent 20 minutes on phone with  pt who refuses to take higher dose of Eliquis d/t nosebleeds.  Unwilling to consider another blood thinner.  Unwilling to come in to see MD to discuss this further.

## 2023-05-15 ENCOUNTER — Ambulatory Visit (INDEPENDENT_AMBULATORY_CARE_PROVIDER_SITE_OTHER): Payer: Medicare HMO

## 2023-05-15 DIAGNOSIS — I495 Sick sinus syndrome: Secondary | ICD-10-CM | POA: Diagnosis not present

## 2023-05-15 LAB — CUP PACEART REMOTE DEVICE CHECK
Battery Remaining Longevity: 103 mo
Battery Remaining Percentage: 86 %
Battery Voltage: 3.02 V
Brady Statistic AP VP Percent: 1 %
Brady Statistic AP VS Percent: 66 %
Brady Statistic AS VP Percent: 1 %
Brady Statistic AS VS Percent: 33 %
Brady Statistic RA Percent Paced: 64 %
Brady Statistic RV Percent Paced: 1 %
Date Time Interrogation Session: 20240604020015
Implantable Lead Connection Status: 753985
Implantable Lead Connection Status: 753985
Implantable Lead Implant Date: 20220603
Implantable Lead Implant Date: 20220603
Implantable Lead Location: 753860
Implantable Lead Location: 753860
Implantable Pulse Generator Implant Date: 20220603
Lead Channel Impedance Value: 430 Ohm
Lead Channel Impedance Value: 440 Ohm
Lead Channel Pacing Threshold Amplitude: 0.75 V
Lead Channel Pacing Threshold Amplitude: 0.75 V
Lead Channel Pacing Threshold Pulse Width: 0.5 ms
Lead Channel Pacing Threshold Pulse Width: 0.5 ms
Lead Channel Sensing Intrinsic Amplitude: 12 mV
Lead Channel Sensing Intrinsic Amplitude: 2.7 mV
Lead Channel Setting Pacing Amplitude: 2 V
Lead Channel Setting Pacing Amplitude: 2.5 V
Lead Channel Setting Pacing Pulse Width: 0.5 ms
Lead Channel Setting Sensing Sensitivity: 2 mV
Pulse Gen Model: 2272
Pulse Gen Serial Number: 3927571

## 2023-06-11 NOTE — Progress Notes (Signed)
Remote pacemaker transmission.   

## 2023-06-24 NOTE — Progress Notes (Signed)
Electrophysiology Office Note:   Date:  06/25/2023  ID:  Amanda Olsen, DOB [birth year], MRN [id number]  Primary Cardiologist: Charlton Haws, MD Electrophysiologist: Regan Lemming, MD      History of Present Illness:   Amanda Olsen is a 89 y.o. female with h/o HTN, persistent atrial fibrillation (previously on tikosyn, transitioned to amiodarone with more episodes of AF), sick sinus syndrome s/p Abbott dual-chamber PPM (05/13/2021) seen today for routine electrophysiology followup.   In May 2024, she called the office with nose bleeds. Her last PaceArt was 05/15/2023 with normal remote, battery and lead parameters stable.    Since last being seen in our clinic the patient reports doing well.  She walks 45 minutes every day early in the am.  Occasionally notices shortness of breath.  She reports she has "thin blood", frequent nose bleeds and does not want to take the full dose Eliquis.   She denies chest pain, palpitations, dyspnea, PND, orthopnea, nausea, vomiting, dizziness, syncope, edema, weight gain, or early satiety. No blood in stool or dark tarry stools.   Review of systems complete and found to be negative unless listed in HPI.    EP information / Studies Reviewed:    EKG is ordered today. Personal review as below.  EKG Interpretation Date/Time:  Monday June 25 2023 10:16:01 EDT Ventricular Rate:  54 PR Interval:  216 QRS Duration:  138 QT Interval:  458 QTC Calculation: 434 R Axis:   33  Text Interpretation: Sinus bradycardia with 1st degree A-V block Left bundle branch block When compared with ECG of 17-Mar-2021 14:28, PR interval has increased T wave inversion now evident in Inferior leads T wave inversion now evident in Lateral leads Confirmed by Canary Brim ([id number]) on 06/25/2023 10:23:10 AM   PPM Interrogation-  reviewed in detail today,  See PACEART report.  Device History: Abbott Dual Chamber PPM implanted 05/13/2021 for Sinus Node Dysfunction  Studies:   ECHO 05/05/20 > LVEF 50-55%, no RWMA, Grade I DD, RVSP normal  Risk Assessment/Calculations:    CHA2DS2-VASc Score = 4  {Click here to calculate score.  REFRESH note before signing.  This indicates a 4.8% annual risk of stroke. The patient's score is based upon: CHF History: 0 HTN History: 1 Diabetes History: 0 Stroke History: 0 Vascular Disease History: 0 Age Score: 2 Gender Score: 1             Physical Exam:   VS:  BP 134/88   Pulse (!) 54   Ht 5\' 6"  (1.676 m)   Wt 145 lb 9.6 oz (66 kg)   SpO2 97%   BMI 23.50 kg/m    Wt Readings from Last 3 Encounters:  06/25/23 145 lb 9.6 oz (66 kg)  12/20/22 146 lb (66.2 kg)  02/16/22 138 lb 3.2 oz (62.7 kg)     GEN: Well nourished, well developed in no acute distress NECK: No JVD; No carotid bruits CARDIAC: Regular rate and rhythm, no murmurs, rubs, gallops RESPIRATORY:  Clear to auscultation without rales, wheezing or rhonchi  ABDOMEN: Soft, non-tender, non-distended EXTREMITIES:  No edema; No deformity   ASSESSMENT AND PLAN:    Paroxysmal Atrial Fibrillation   CHA2DS2Vasc 4   -continue amiodarone > reviewed need to continue as pt has questions about coming off due to hypothyroidism, encouraged compliance -continue cardizem CD 240 mg  -assess TSH, free T4, CMP  -recommended to use eliquis 5mg  BID as pt is taking 2.5mg  BID, discussed stoke protection is at 5mg  dosing.  Pt indicates she understands but is not interested in taking.  We discussed seeing ENT to potentially cauterize her nose and it would likely allow her to take the full dose without nose bleeds.   SND s/p Abbott PPM  Normal PPM function See Pace Art report No changes today  HTN  -well controlled   Secondary Hypercoagulable State  -eliquis as above  Disposition:   Follow up with Dr. Elberta Fortis in 6 months  Signed, Canary Brim, MSN, APRN, NP-C, AGACNP-BC [facility] - Electrophysiology  06/25/2023, 10:23 AM

## 2023-06-25 ENCOUNTER — Encounter: Payer: Self-pay | Admitting: Student

## 2023-06-25 ENCOUNTER — Ambulatory Visit: Payer: Medicare HMO | Attending: Student | Admitting: Pulmonary Disease

## 2023-06-25 VITALS — BP 134/88 | HR 54 | Ht 66.0 in | Wt 145.6 lb

## 2023-06-25 DIAGNOSIS — I495 Sick sinus syndrome: Secondary | ICD-10-CM

## 2023-06-25 DIAGNOSIS — Z950 Presence of cardiac pacemaker: Secondary | ICD-10-CM

## 2023-06-25 DIAGNOSIS — I4819 Other persistent atrial fibrillation: Secondary | ICD-10-CM

## 2023-06-25 DIAGNOSIS — E039 Hypothyroidism, unspecified: Secondary | ICD-10-CM

## 2023-06-25 LAB — CUP PACEART INCLINIC DEVICE CHECK
Battery Remaining Longevity: 110 mo
Battery Voltage: 3.02 V
Brady Statistic RA Percent Paced: 65 %
Brady Statistic RV Percent Paced: 0.81 %
Date Time Interrogation Session: 20240715123415
Implantable Lead Connection Status: 753985
Implantable Lead Connection Status: 753985
Implantable Lead Implant Date: 20220603
Implantable Lead Implant Date: 20220603
Implantable Lead Location: 753860
Implantable Lead Location: 753860
Implantable Pulse Generator Implant Date: 20220603
Lead Channel Impedance Value: 425 Ohm
Lead Channel Impedance Value: 450 Ohm
Lead Channel Pacing Threshold Amplitude: 0.75 V
Lead Channel Pacing Threshold Amplitude: 0.75 V
Lead Channel Pacing Threshold Amplitude: 1 V
Lead Channel Pacing Threshold Amplitude: 1 V
Lead Channel Pacing Threshold Pulse Width: 0.5 ms
Lead Channel Pacing Threshold Pulse Width: 0.5 ms
Lead Channel Pacing Threshold Pulse Width: 0.5 ms
Lead Channel Pacing Threshold Pulse Width: 0.5 ms
Lead Channel Sensing Intrinsic Amplitude: 12 mV
Lead Channel Sensing Intrinsic Amplitude: 2.9 mV
Lead Channel Setting Pacing Amplitude: 2 V
Lead Channel Setting Pacing Amplitude: 2.5 V
Lead Channel Setting Pacing Pulse Width: 0.5 ms
Lead Channel Setting Sensing Sensitivity: 2 mV
Pulse Gen Model: 2272
Pulse Gen Serial Number: 3927571

## 2023-06-25 NOTE — Patient Instructions (Signed)
Medication Instructions:  Your physician recommends that you continue on your current medications as directed. Please refer to the Current Medication list given to you today.  *If you need a refill on your cardiac medications before your next appointment, please call your pharmacy*  Lab Work: CMP, TSH, FreeT4-TODAY If you have labs (blood work) drawn today and your tests are completely normal, you will receive your results only by: MyChart Message (if you have MyChart) OR A paper copy in the mail If you have any lab test that is abnormal or we need to change your treatment, we will call you to review the results.  Follow-Up: At [facility], you and your health needs are our priority.  As part of our continuing mission to provide you with exceptional heart care, we have created designated Provider Care Teams.  These Care Teams include your primary Cardiologist (physician) and Advanced Practice Providers (APPs -  Physician Assistants and Nurse Practitioners) who all work together to provide you with the care you need, when you need it.  Your next appointment:   6 month(s)  Provider:   Loman Brooklyn, MD

## 2023-06-26 LAB — T4, FREE: Free T4: 1.17 ng/dL (ref 0.82–1.77)

## 2023-06-26 LAB — TSH: TSH: 3.72 u[IU]/mL (ref 0.450–4.500)

## 2023-06-26 LAB — COMPREHENSIVE METABOLIC PANEL
ALT: 15 IU/L (ref 0–32)
AST: 27 IU/L (ref 0–40)
Albumin: 4.3 g/dL (ref 3.7–4.7)
Alkaline Phosphatase: 80 IU/L (ref 44–121)
BUN/Creatinine Ratio: 15 (ref 12–28)
BUN: 15 mg/dL (ref 8–27)
Bilirubin Total: 0.3 mg/dL (ref 0.0–1.2)
CO2: 22 mmol/L (ref 20–29)
Calcium: 9.1 mg/dL (ref 8.7–10.3)
Chloride: 99 mmol/L (ref 96–106)
Creatinine, Ser: 0.97 mg/dL (ref 0.57–1.00)
Globulin, Total: 3.4 g/dL (ref 1.5–4.5)
Glucose: 91 mg/dL (ref 70–99)
Potassium: 4.5 mmol/L (ref 3.5–5.2)
Sodium: 136 mmol/L (ref 134–144)
Total Protein: 7.7 g/dL (ref 6.0–8.5)
eGFR: 57 mL/min/{1.73_m2} — ABNORMAL LOW (ref >59)

## 2023-08-14 ENCOUNTER — Ambulatory Visit (INDEPENDENT_AMBULATORY_CARE_PROVIDER_SITE_OTHER): Payer: Medicare HMO

## 2023-08-14 DIAGNOSIS — I495 Sick sinus syndrome: Secondary | ICD-10-CM | POA: Diagnosis not present

## 2023-08-15 LAB — CUP PACEART REMOTE DEVICE CHECK
Battery Remaining Longevity: 101 mo
Battery Remaining Percentage: 84 %
Battery Voltage: 3.02 V
Brady Statistic AP VP Percent: 1 %
Brady Statistic AP VS Percent: 63 %
Brady Statistic AS VP Percent: 1 %
Brady Statistic AS VS Percent: 36 %
Brady Statistic RA Percent Paced: 62 %
Brady Statistic RV Percent Paced: 1 %
Date Time Interrogation Session: 20240903020014
Implantable Lead Connection Status: 753985
Implantable Lead Connection Status: 753985
Implantable Lead Implant Date: 20220603
Implantable Lead Implant Date: 20220603
Implantable Lead Location: 753860
Implantable Lead Location: 753860
Implantable Pulse Generator Implant Date: 20220603
Lead Channel Impedance Value: 410 Ohm
Lead Channel Impedance Value: 410 Ohm
Lead Channel Pacing Threshold Amplitude: 0.75 V
Lead Channel Pacing Threshold Amplitude: 1 V
Lead Channel Pacing Threshold Pulse Width: 0.5 ms
Lead Channel Pacing Threshold Pulse Width: 0.5 ms
Lead Channel Sensing Intrinsic Amplitude: 11.7 mV
Lead Channel Sensing Intrinsic Amplitude: 2.8 mV
Lead Channel Setting Pacing Amplitude: 2 V
Lead Channel Setting Pacing Amplitude: 2.5 V
Lead Channel Setting Pacing Pulse Width: 0.5 ms
Lead Channel Setting Sensing Sensitivity: 2 mV
Pulse Gen Model: 2272
Pulse Gen Serial Number: 3927571

## 2023-08-25 ENCOUNTER — Other Ambulatory Visit: Payer: Self-pay | Admitting: Cardiology

## 2023-08-25 DIAGNOSIS — I480 Paroxysmal atrial fibrillation: Secondary | ICD-10-CM

## 2023-08-27 NOTE — Progress Notes (Signed)
Remote pacemaker transmission.   

## 2023-08-27 NOTE — Telephone Encounter (Signed)
Prescription refill request for Eliquis received. Indication:afib Last office visit:7/24 Scr:0.97  7/24 Age: 89 Weight:66  kg  Prescription refilled

## 2023-08-28 DIAGNOSIS — I129 Hypertensive chronic kidney disease with stage 1 through stage 4 chronic kidney disease, or unspecified chronic kidney disease: Secondary | ICD-10-CM | POA: Diagnosis not present

## 2023-08-28 DIAGNOSIS — E039 Hypothyroidism, unspecified: Secondary | ICD-10-CM | POA: Diagnosis not present

## 2023-08-28 DIAGNOSIS — I480 Paroxysmal atrial fibrillation: Secondary | ICD-10-CM | POA: Diagnosis not present

## 2023-09-04 DIAGNOSIS — Z79899 Other long term (current) drug therapy: Secondary | ICD-10-CM | POA: Diagnosis not present

## 2023-09-04 DIAGNOSIS — E039 Hypothyroidism, unspecified: Secondary | ICD-10-CM | POA: Diagnosis not present

## 2023-09-04 DIAGNOSIS — Z23 Encounter for immunization: Secondary | ICD-10-CM | POA: Diagnosis not present

## 2023-09-04 DIAGNOSIS — I480 Paroxysmal atrial fibrillation: Secondary | ICD-10-CM | POA: Diagnosis not present

## 2023-09-04 DIAGNOSIS — I129 Hypertensive chronic kidney disease with stage 1 through stage 4 chronic kidney disease, or unspecified chronic kidney disease: Secondary | ICD-10-CM | POA: Diagnosis not present

## 2023-09-04 DIAGNOSIS — R7309 Other abnormal glucose: Secondary | ICD-10-CM | POA: Diagnosis not present

## 2023-11-13 ENCOUNTER — Ambulatory Visit (INDEPENDENT_AMBULATORY_CARE_PROVIDER_SITE_OTHER): Payer: Medicare HMO

## 2023-11-13 DIAGNOSIS — I495 Sick sinus syndrome: Secondary | ICD-10-CM

## 2023-11-14 LAB — CUP PACEART REMOTE DEVICE CHECK
Battery Remaining Longevity: 100 mo
Battery Remaining Percentage: 82 %
Battery Voltage: 3.02 V
Brady Statistic AP VP Percent: 1 %
Brady Statistic AP VS Percent: 58 %
Brady Statistic AS VP Percent: 1 %
Brady Statistic AS VS Percent: 41 %
Brady Statistic RA Percent Paced: 57 %
Brady Statistic RV Percent Paced: 1 %
Date Time Interrogation Session: 20241203020014
Implantable Lead Connection Status: 753985
Implantable Lead Connection Status: 753985
Implantable Lead Implant Date: 20220603
Implantable Lead Implant Date: 20220603
Implantable Lead Location: 753860
Implantable Lead Location: 753860
Implantable Pulse Generator Implant Date: 20220603
Lead Channel Impedance Value: 430 Ohm
Lead Channel Impedance Value: 440 Ohm
Lead Channel Pacing Threshold Amplitude: 0.75 V
Lead Channel Pacing Threshold Amplitude: 1 V
Lead Channel Pacing Threshold Pulse Width: 0.5 ms
Lead Channel Pacing Threshold Pulse Width: 0.5 ms
Lead Channel Sensing Intrinsic Amplitude: 12 mV
Lead Channel Sensing Intrinsic Amplitude: 2.6 mV
Lead Channel Setting Pacing Amplitude: 2 V
Lead Channel Setting Pacing Amplitude: 2.5 V
Lead Channel Setting Pacing Pulse Width: 0.5 ms
Lead Channel Setting Sensing Sensitivity: 2 mV
Pulse Gen Model: 2272
Pulse Gen Serial Number: 3927571

## 2023-12-25 ENCOUNTER — Other Ambulatory Visit: Payer: Self-pay | Admitting: Cardiology

## 2023-12-25 DIAGNOSIS — I480 Paroxysmal atrial fibrillation: Secondary | ICD-10-CM

## 2023-12-25 NOTE — Telephone Encounter (Signed)
 Eliquis 5mg  refill request received. Patient is 89 years old, weight-66kg, Crea-0.97 on 06/25/23, Diagnosis-Afib, and last seen by Canary Brim on 06/25/23. Dose is appropriate based on dosing criteria. Will send in refill to requested pharmacy.

## 2024-01-06 NOTE — Progress Notes (Unsigned)
  Electrophysiology Office Note:   ID:  Amanda Olsen, Amanda Olsen 11/28/1934, MRN [id number]  Primary Cardiologist: Charlton Haws, MD Electrophysiologist: Regan Lemming, MD  {Click to update primary MD,subspecialty MD or APP then REFRESH:1}    History of Present Illness:   Amanda Olsen is a 89 y.o. female with h/o HTN, persistent atrial fibrillation (previously on tikosyn, transitioned to amiodarone with more episodes of AF), sick sinus syndrome s/p Abbott dual-chamber PPM (05/13/2021)  seen today for routine electrophysiology followup.   Since last being seen in our clinic the patient reports doing ***.  she denies chest pain, palpitations, dyspnea, PND, orthopnea, nausea, vomiting, dizziness, syncope, edema, weight gain, or early satiety.   Review of systems complete and found to be negative unless listed in HPI.   EP Information / Studies Reviewed:    {EKGtoday:28818}       PPM Interrogation-  reviewed in detail today,  See PACEART report.  Arrhythmia/Device History Abbott Dual Chamber PPM implanted 05/13/2021 for Sinus Node Dysfunction   Physical Exam:   VS:  There were no vitals taken for this visit.   Wt Readings from Last 3 Encounters:  06/25/23 145 lb 9.6 oz (66 kg)  12/20/22 146 lb (66.2 kg)  02/16/22 138 lb 3.2 oz (62.7 kg)     GEN: No acute distress  NECK: No JVD; No carotid bruits CARDIAC: {EPRHYTHM:28826}, no murmurs, rubs, gallops RESPIRATORY:  Clear to auscultation without rales, wheezing or rhonchi  ABDOMEN: Soft, non-tender, non-distended EXTREMITIES:  {EDEMA LEVEL:28147::"No"} edema; No deformity   ASSESSMENT AND PLAN:    SND s/p Abbott PPM  Normal PPM function See Pace Art report No changes today  Paroxysmal AF Continue amiodarone *** Continue diltiazem 240 mg daily Surveillance labs today Continue eliquis 2.5 mg BID. Has refused appropriate dose of 5 mg due to h/o epistaxis.   HTN Stable on current regimen   Secondary hypercoagulable  state Pt on Eliquis as above   {Click here to Review PMH, Prob List, Meds, Allergies, SHx, FHx  :1}   Disposition:   Follow up with {EPPROVIDERS:28135} {EPFOLLOW UP:28173}  Signed, Graciella Freer, PA-C

## 2024-01-07 ENCOUNTER — Ambulatory Visit: Payer: Medicare HMO | Attending: Student | Admitting: Student

## 2024-01-07 ENCOUNTER — Encounter: Payer: Self-pay | Admitting: Student

## 2024-01-07 VITALS — BP 136/74 | HR 58 | Ht 66.0 in | Wt 147.8 lb

## 2024-01-07 DIAGNOSIS — D6869 Other thrombophilia: Secondary | ICD-10-CM

## 2024-01-07 DIAGNOSIS — E039 Hypothyroidism, unspecified: Secondary | ICD-10-CM | POA: Diagnosis not present

## 2024-01-07 DIAGNOSIS — I495 Sick sinus syndrome: Secondary | ICD-10-CM

## 2024-01-07 DIAGNOSIS — E038 Other specified hypothyroidism: Secondary | ICD-10-CM | POA: Diagnosis not present

## 2024-01-07 DIAGNOSIS — Z950 Presence of cardiac pacemaker: Secondary | ICD-10-CM

## 2024-01-07 LAB — CUP PACEART INCLINIC DEVICE CHECK
Battery Remaining Longevity: 106 mo
Battery Voltage: 3.02 V
Brady Statistic RA Percent Paced: 56 %
Brady Statistic RV Percent Paced: 0.73 %
Date Time Interrogation Session: 20250127125359
Implantable Lead Connection Status: 753985
Implantable Lead Connection Status: 753985
Implantable Lead Implant Date: 20220603
Implantable Lead Implant Date: 20220603
Implantable Lead Location: 753860
Implantable Lead Location: 753860
Implantable Pulse Generator Implant Date: 20220603
Lead Channel Impedance Value: 425 Ohm
Lead Channel Impedance Value: 450 Ohm
Lead Channel Pacing Threshold Amplitude: 0.75 V
Lead Channel Pacing Threshold Amplitude: 0.75 V
Lead Channel Pacing Threshold Amplitude: 1 V
Lead Channel Pacing Threshold Amplitude: 1 V
Lead Channel Pacing Threshold Pulse Width: 0.5 ms
Lead Channel Pacing Threshold Pulse Width: 0.5 ms
Lead Channel Pacing Threshold Pulse Width: 0.5 ms
Lead Channel Pacing Threshold Pulse Width: 0.5 ms
Lead Channel Sensing Intrinsic Amplitude: 12 mV
Lead Channel Sensing Intrinsic Amplitude: 2.6 mV
Lead Channel Setting Pacing Amplitude: 2 V
Lead Channel Setting Pacing Amplitude: 2.5 V
Lead Channel Setting Pacing Pulse Width: 0.5 ms
Lead Channel Setting Sensing Sensitivity: 2 mV
Pulse Gen Model: 2272
Pulse Gen Serial Number: 3927571

## 2024-01-07 NOTE — Patient Instructions (Signed)
Medication Instructions:  Your physician recommends that you continue on your current medications as directed. Please refer to the Current Medication list given to you today.  *If you need a refill on your cardiac medications before your next appointment, please call your pharmacy*  Lab Work: CMET, CBC, TSH, FreeT4-TODAY If you have labs (blood work) drawn today and your tests are completely normal, you will receive your results only by: MyChart Message (if you have MyChart) OR A paper copy in the mail If you have any lab test that is abnormal or we need to change your treatment, we will call you to review the results.  Follow-Up: At [facility], you and your health needs are our priority.  As part of our continuing mission to provide you with exceptional heart care, we have created designated Provider Care Teams.  These Care Teams include your primary Cardiologist (physician) and Advanced Practice Providers (APPs -  Physician Assistants and Nurse Practitioners) who all work together to provide you with the care you need, when you need it.  Your next appointment:   6 month(s)  Provider:   Casimiro Needle "Otilio Saber, PA-C

## 2024-01-08 LAB — COMPREHENSIVE METABOLIC PANEL
ALT: 16 [IU]/L (ref 0–32)
AST: 28 [IU]/L (ref 0–40)
Albumin: 4.2 g/dL (ref 3.7–4.7)
Alkaline Phosphatase: 82 [IU]/L (ref 44–121)
BUN/Creatinine Ratio: 16 (ref 12–28)
BUN: 13 mg/dL (ref 8–27)
Bilirubin Total: 0.4 mg/dL (ref 0.0–1.2)
CO2: 22 mmol/L (ref 20–29)
Calcium: 9 mg/dL (ref 8.7–10.3)
Chloride: 96 mmol/L (ref 96–106)
Creatinine, Ser: 0.83 mg/dL (ref 0.57–1.00)
Globulin, Total: 3.3 g/dL (ref 1.5–4.5)
Glucose: 94 mg/dL (ref 70–99)
Potassium: 4.6 mmol/L (ref 3.5–5.2)
Sodium: 133 mmol/L — ABNORMAL LOW (ref 134–144)
Total Protein: 7.5 g/dL (ref 6.0–8.5)
eGFR: 68 mL/min/{1.73_m2} (ref >59)

## 2024-01-08 LAB — TSH: TSH: 7 u[IU]/mL — ABNORMAL HIGH (ref 0.450–4.500)

## 2024-01-08 LAB — CBC
Hematocrit: 38.5 % (ref 34.0–46.6)
Hemoglobin: 12.1 g/dL (ref 11.1–15.9)
MCH: 28.8 pg (ref 26.6–33.0)
MCHC: 31.4 g/dL — ABNORMAL LOW (ref 31.5–35.7)
MCV: 92 fL (ref 79–97)
Platelets: 161 10*3/uL (ref 150–450)
RBC: 4.2 x10E6/uL (ref 3.77–5.28)
RDW: 12.5 % (ref 11.7–15.4)
WBC: 4.8 10*3/uL (ref 3.4–10.8)

## 2024-01-08 LAB — T4, FREE: Free T4: 1.1 ng/dL (ref 0.82–1.77)

## 2024-01-29 DIAGNOSIS — M25562 Pain in left knee: Secondary | ICD-10-CM | POA: Diagnosis not present

## 2024-02-12 ENCOUNTER — Ambulatory Visit (INDEPENDENT_AMBULATORY_CARE_PROVIDER_SITE_OTHER): Payer: Medicare HMO

## 2024-02-12 DIAGNOSIS — I495 Sick sinus syndrome: Secondary | ICD-10-CM

## 2024-02-14 LAB — CUP PACEART REMOTE DEVICE CHECK
Battery Remaining Longevity: 97 mo
Battery Remaining Percentage: 80 %
Battery Voltage: 3.02 V
Brady Statistic AP VP Percent: 1 %
Brady Statistic AP VS Percent: 52 %
Brady Statistic AS VP Percent: 1 %
Brady Statistic AS VS Percent: 47 %
Brady Statistic RA Percent Paced: 51 %
Brady Statistic RV Percent Paced: 1 %
Date Time Interrogation Session: 20250304020013
Implantable Lead Connection Status: 753985
Implantable Lead Connection Status: 753985
Implantable Lead Implant Date: 20220603
Implantable Lead Implant Date: 20220603
Implantable Lead Location: 753860
Implantable Lead Location: 753860
Implantable Pulse Generator Implant Date: 20220603
Lead Channel Impedance Value: 400 Ohm
Lead Channel Impedance Value: 430 Ohm
Lead Channel Pacing Threshold Amplitude: 0.75 V
Lead Channel Pacing Threshold Amplitude: 1 V
Lead Channel Pacing Threshold Pulse Width: 0.5 ms
Lead Channel Pacing Threshold Pulse Width: 0.5 ms
Lead Channel Sensing Intrinsic Amplitude: 11.1 mV
Lead Channel Sensing Intrinsic Amplitude: 2.5 mV
Lead Channel Setting Pacing Amplitude: 2 V
Lead Channel Setting Pacing Amplitude: 2.5 V
Lead Channel Setting Pacing Pulse Width: 0.5 ms
Lead Channel Setting Sensing Sensitivity: 2 mV
Pulse Gen Model: 2272
Pulse Gen Serial Number: 3927571

## 2024-02-15 ENCOUNTER — Other Ambulatory Visit: Payer: Self-pay

## 2024-02-15 MED ORDER — AMIODARONE HCL 200 MG PO TABS: 100.0000 mg | ORAL_TABLET | Freq: Every day | ORAL | 3 refills | Status: AC

## 2024-02-26 DIAGNOSIS — Z13220 Encounter for screening for lipoid disorders: Secondary | ICD-10-CM | POA: Diagnosis not present

## 2024-02-26 DIAGNOSIS — R7309 Other abnormal glucose: Secondary | ICD-10-CM | POA: Diagnosis not present

## 2024-02-26 DIAGNOSIS — E039 Hypothyroidism, unspecified: Secondary | ICD-10-CM | POA: Diagnosis not present

## 2024-02-26 DIAGNOSIS — Z79899 Other long term (current) drug therapy: Secondary | ICD-10-CM | POA: Diagnosis not present

## 2024-02-26 DIAGNOSIS — I129 Hypertensive chronic kidney disease with stage 1 through stage 4 chronic kidney disease, or unspecified chronic kidney disease: Secondary | ICD-10-CM | POA: Diagnosis not present

## 2024-02-26 DIAGNOSIS — I480 Paroxysmal atrial fibrillation: Secondary | ICD-10-CM | POA: Diagnosis not present

## 2024-02-27 ENCOUNTER — Other Ambulatory Visit (HOSPITAL_COMMUNITY): Payer: Self-pay | Admitting: Cardiology

## 2024-02-28 ENCOUNTER — Other Ambulatory Visit: Payer: Self-pay

## 2024-02-28 MED ORDER — DILTIAZEM HCL ER COATED BEADS 240 MG PO CP24: 240.0000 mg | ORAL_CAPSULE | Freq: Every day | ORAL | 3 refills | Status: AC

## 2024-03-04 DIAGNOSIS — I495 Sick sinus syndrome: Secondary | ICD-10-CM | POA: Diagnosis not present

## 2024-03-04 DIAGNOSIS — Z950 Presence of cardiac pacemaker: Secondary | ICD-10-CM | POA: Diagnosis not present

## 2024-03-04 DIAGNOSIS — I4891 Unspecified atrial fibrillation: Secondary | ICD-10-CM | POA: Diagnosis not present

## 2024-03-04 DIAGNOSIS — E039 Hypothyroidism, unspecified: Secondary | ICD-10-CM | POA: Diagnosis not present

## 2024-03-04 DIAGNOSIS — N1831 Chronic kidney disease, stage 3a: Secondary | ICD-10-CM | POA: Diagnosis not present

## 2024-03-04 DIAGNOSIS — Z0000 Encounter for general adult medical examination without abnormal findings: Secondary | ICD-10-CM | POA: Diagnosis not present

## 2024-03-04 DIAGNOSIS — I129 Hypertensive chronic kidney disease with stage 1 through stage 4 chronic kidney disease, or unspecified chronic kidney disease: Secondary | ICD-10-CM | POA: Diagnosis not present

## 2024-03-04 DIAGNOSIS — R7309 Other abnormal glucose: Secondary | ICD-10-CM | POA: Diagnosis not present

## 2024-03-04 DIAGNOSIS — M25562 Pain in left knee: Secondary | ICD-10-CM | POA: Diagnosis not present

## 2024-03-19 NOTE — Progress Notes (Signed)
 Remote pacemaker transmission.

## 2024-03-19 NOTE — Addendum Note (Signed)
 Addended by: Geralyn Flash D on: 03/19/2024 02:48 PM   Modules accepted: Orders

## 2024-04-29 ENCOUNTER — Other Ambulatory Visit: Payer: Self-pay

## 2024-04-29 ENCOUNTER — Emergency Department (HOSPITAL_COMMUNITY)
Admission: EM | Admit: 2024-04-29 | Discharge: 2024-04-29 | Disposition: A | Discharge status: Home / Self Care | Attending: Emergency Medicine | Admitting: Emergency Medicine

## 2024-04-29 ENCOUNTER — Encounter (HOSPITAL_COMMUNITY): Payer: Self-pay | Admitting: *Deleted

## 2024-04-29 DIAGNOSIS — R1013 Epigastric pain: Secondary | ICD-10-CM | POA: Insufficient documentation

## 2024-04-29 DIAGNOSIS — Z950 Presence of cardiac pacemaker: Secondary | ICD-10-CM | POA: Insufficient documentation

## 2024-04-29 DIAGNOSIS — I4891 Unspecified atrial fibrillation: Secondary | ICD-10-CM | POA: Diagnosis not present

## 2024-04-29 DIAGNOSIS — I10 Essential (primary) hypertension: Secondary | ICD-10-CM | POA: Diagnosis not present

## 2024-04-29 DIAGNOSIS — R000 Tachycardia, unspecified: Secondary | ICD-10-CM | POA: Diagnosis not present

## 2024-04-29 DIAGNOSIS — R002 Palpitations: Secondary | ICD-10-CM | POA: Diagnosis not present

## 2024-04-29 LAB — CBC
HCT: 40.2 % (ref 36.0–46.0)
Hemoglobin: 12.9 g/dL (ref 12.0–15.0)
MCH: 29.9 pg (ref 26.0–34.0)
MCHC: 32.1 g/dL (ref 30.0–36.0)
MCV: 93.3 fL (ref 80.0–100.0)
Platelets: 144 10*3/uL — ABNORMAL LOW (ref 150–400)
RBC: 4.31 MIL/uL (ref 3.87–5.11)
RDW: 13.8 % (ref 11.5–15.5)
WBC: 6.6 10*3/uL (ref 4.0–10.5)
nRBC: 0 % (ref 0.0–0.2)

## 2024-04-29 LAB — COMPREHENSIVE METABOLIC PANEL WITH GFR
ALT: 14 U/L (ref 0–44)
AST: 30 U/L (ref 15–41)
Albumin: 3.8 g/dL (ref 3.5–5.0)
Alkaline Phosphatase: 69 U/L (ref 38–126)
Anion gap: 12 (ref 5–15)
BUN: 14 mg/dL (ref 8–23)
CO2: 22 mmol/L (ref 22–32)
Calcium: 9.5 mg/dL (ref 8.9–10.3)
Chloride: 101 mmol/L (ref 98–111)
Creatinine, Ser: 0.94 mg/dL (ref 0.44–1.00)
GFR, Estimated: 59 mL/min — ABNORMAL LOW (ref >60)
Glucose, Bld: 128 mg/dL — ABNORMAL HIGH (ref 70–99)
Potassium: 3.9 mmol/L (ref 3.5–5.1)
Sodium: 135 mmol/L (ref 135–145)
Total Bilirubin: 0.3 mg/dL (ref 0.0–1.2)
Total Protein: 7.8 g/dL (ref 6.5–8.1)

## 2024-04-29 LAB — LIPASE, BLOOD: Lipase: 31 U/L (ref 11–51)

## 2024-04-29 LAB — TROPONIN I (HIGH SENSITIVITY)
Troponin I (High Sensitivity): 14 ng/L (ref <18)
Troponin I (High Sensitivity): 15 ng/L (ref <18)

## 2024-04-29 LAB — MAGNESIUM: Magnesium: 2 mg/dL (ref 1.7–2.4)

## 2024-04-29 MED ORDER — DILTIAZEM HCL 25 MG/5ML IV SOLN
15.0000 mg | Freq: Once | INTRAVENOUS | Status: AC
  Administered 2024-04-29: 15 mg via INTRAVENOUS
  Filled 2024-04-29: qty 5

## 2024-04-29 NOTE — ED Triage Notes (Signed)
 Via [place] interpreter, pt says pt says she took her normal cardizem  dose around 1800 last night. She says she has atrial fibrillation, and she felt like she was in afib around 2100 last night. Denies CP or SOB. Takes Eliquis , last dose was last night. Pt says that she feels weakness all over. Pt ambulated to restroom, she says she feels better and does not feel like her heart is racing as much.

## 2024-04-29 NOTE — ED Provider Notes (Signed)
 [facility] Emergency Department Provider Note MRN:  [id number]  Arrival date & time: 04/29/24     Chief Complaint   Palpitations   History of Present Illness   Amanda Olsen is a 89 y.o. year-old female with a history of A-fib presenting to the ED with chief complaint of palpitations.  Palpitations with malaise, fatigue, some epigastric discomfort lasting for 3 hours at home, not going away despite taking Cardizem  at 6 PM.  Long history of A-fib.  Denies chest pain, no recent illness.  Review of Systems  A thorough review of systems was obtained and all systems are negative except as noted in the HPI and PMH.   Patient's Health History    Past Medical History:  Diagnosis Date   Atrial fibrillation (HCC)    mostly in SR on Tikosyn    Cataract    HTN (hypertension)    Rosacea     Past Surgical History:  Procedure Laterality Date   PACEMAKER IMPLANT N/A 05/13/2021   Procedure: PACEMAKER IMPLANT;  Surgeon: Lei Pump, MD;  Location[facility];  Service: Cardiovascular;  Laterality: N/A;   VARICOSE VEIN SURGERY      Family History  Problem Relation Age of Onset   Heart Problems Mother     Social History   Socioeconomic History   Marital status: Widowed    Spouse name: Not on file   Number of children: Not on file   Years of education: Not on file   Highest education level: Not on file  Occupational History   Not on file  Tobacco Use   Smoking status: Never   Smokeless tobacco: Never  Vaping Use   Vaping status: Never Used  Substance and Sexual Activity   Alcohol use: No   Drug use: No   Sexual activity: Not Currently  Other Topics Concern   Not on file  Social History Narrative   Not on file   Social Drivers of Health   Financial Resource Strain: Not on file  Food Insecurity: Not on file  Transportation Needs: Not on file  Physical Activity: Not on file  Stress: Not on file  Social Connections: Not on file   Intimate Partner Violence: Not on file     Physical Exam   Vitals:   04/29/24 0530 04/29/24 0545  BP: 125/63 119/71  Pulse: (!) 52 (!) 49  Resp: 15 13  Temp:    SpO2: 99% 98%    CONSTITUTIONAL: Well-appearing, NAD NEURO/PSYCH:  Alert and oriented x 3, no focal deficits EYES:  eyes equal and reactive ENT/NECK:  no LAD, no JVD CARDIO: Tachycardic and irregular rate, well-perfused, normal S1 and S2 PULM:  CTAB no wheezing or rhonchi GI/GU:  non-distended, non-tender MSK/SPINE:  No gross deformities, no edema SKIN:  no rash, atraumatic   *Additional and/or pertinent findings included in MDM below  Diagnostic and Interventional Summary    EKG Interpretation Date/Time:  Tuesday Apr 29 2024 02:34:08 EDT Ventricular Rate:  136 PR Interval:    QRS Duration:  127 QT Interval:  334 QTC Calculation: 503 R Axis:   21  Text Interpretation: Atrial fibrillation Left bundle branch block Confirmed by Gwenetta Lennert [phone]) on 04/29/2024 3:16:25 AM       Labs Reviewed  CBC - Abnormal; Notable for the following components:      Result Value   Platelets 144 (*)    All other components within normal limits  COMPREHENSIVE METABOLIC PANEL WITH GFR -  Abnormal; Notable for the following components:   Glucose, Bld 128 (*)    GFR, Estimated 59 (*)    All other components within normal limits  LIPASE, BLOOD  MAGNESIUM   TROPONIN I (HIGH SENSITIVITY)  TROPONIN I (HIGH SENSITIVITY)    No orders to display    Medications  diltiazem  (CARDIZEM ) injection 15 mg (15 mg Intravenous Given 04/29/24 0304)     Procedures  /  Critical Care Procedures  ED Course and Medical Decision Making  Initial Impression and Ddx A-fib with mild RVR, rates ranging between 120 and 140.  Some epigastric pain raises consideration for atypical presentation of ACS but felt to be less likely.  Will provide further rate control, check for electrolyte disturbance, reassess.  Past medical/surgical history that  increases complexity of ED encounter: A-fib  Interpretation of Diagnostics I personally reviewed the Laboratory Testing and my interpretation is as follows: No significant blood count or electrolyte disturbance.  Troponin negative    Patient Reassessment and Ultimate Disposition/Management     I discussed the case with cardiology while patient was in A-fib with relatively controlled rates, the plan was to have the morning cardiology rounding team evaluate her and discuss changes to her medications.  However patient then converted back to sinus rhythm, observed a bit longer and is stable without symptoms in sinus rhythm with reassuring workup.  Appropriate for discharge with cardiology follow-up.  Patient management required discussion with the following services or consulting groups:  None  Complexity of Problems Addressed Acute illness or injury that poses threat of life of bodily function  Additional Data Reviewed and Analyzed Further history obtained from: Recent Consult notes and Prior labs/imaging results  Additional Factors Impacting ED Encounter Risk Consideration of hospitalization  Merrick Abe. Harless Lien, MD [facility] Emergency Medicine [facility] [email or web address]  Final Clinical Impressions(s) / ED Diagnoses     ICD-10-CM   1. Atrial fibrillation with rapid ventricular response (HCC)  I48.91       ED Discharge Orders     None        Discharge Instructions Discussed with and Provided to Patient:     Discharge Instructions      You were evaluated in the Emergency Department and after careful evaluation, we did not find any emergent condition requiring admission or further testing in the hospital.  Your exam/testing today is overall reassuring.  Symptoms likely due to A-fib.  You converted back to a normal rhythm here in the emergency department.  Important that you follow-up with your cardiology team to discuss your continued A-fib  management.  Please return to the Emergency Department if you experience any worsening of your condition.   Thank you for allowing us  to be a part of your care.     Edson Graces, MD 04/29/24 [phone]

## 2024-04-29 NOTE — ED Triage Notes (Signed)
 Pt arrives via [facility] from home. Per their report, the pt called out about 3 hours ago, felt like she was in afib and having palpitations, she took home diltiazem  30 mg, no changes. 120-170 on ems arrival. Cardizem  10 mg given IV in the left forearm, repeated dose. Still 150-170 rvr. 120/70, 96% ra.  Hx of afib, has a pacemaker.

## 2024-04-29 NOTE — Discharge Instructions (Signed)
 You were evaluated in the Emergency Department and after careful evaluation, we did not find any emergent condition requiring admission or further testing in the hospital.  Your exam/testing today is overall reassuring.  Symptoms likely due to A-fib.  You converted back to a normal rhythm here in the emergency department.  Important that you follow-up with your cardiology team to discuss your continued A-fib management.  Please return to the Emergency Department if you experience any worsening of your condition.   Thank you for allowing us  to be a part of your care.

## 2024-04-29 NOTE — ED Notes (Signed)
 ED Provider at bedside.

## 2024-05-01 MED ORDER — DILTIAZEM HCL 30 MG PO TABS: ORAL_TABLET | ORAL | 1 refills | Status: AC

## 2024-05-12 ENCOUNTER — Other Ambulatory Visit: Payer: Self-pay

## 2024-05-12 MED ORDER — LISINOPRIL 20 MG PO TABS: 20.0000 mg | ORAL_TABLET | Freq: Two times a day (BID) | ORAL | 1 refills | Status: DC

## 2024-05-13 ENCOUNTER — Ambulatory Visit (INDEPENDENT_AMBULATORY_CARE_PROVIDER_SITE_OTHER): Payer: Medicare HMO

## 2024-05-13 DIAGNOSIS — I495 Sick sinus syndrome: Secondary | ICD-10-CM

## 2024-05-13 LAB — CUP PACEART REMOTE DEVICE CHECK
Battery Remaining Longevity: 94 mo
Battery Remaining Percentage: 77 %
Battery Voltage: 3.02 V
Brady Statistic AP VP Percent: 1 %
Brady Statistic AP VS Percent: 57 %
Brady Statistic AS VP Percent: 1 %
Brady Statistic AS VS Percent: 43 %
Brady Statistic RA Percent Paced: 55 %
Brady Statistic RV Percent Paced: 1 %
Date Time Interrogation Session: 20250603044500
Implantable Lead Connection Status: 753985
Implantable Lead Connection Status: 753985
Implantable Lead Implant Date: 20220603
Implantable Lead Implant Date: 20220603
Implantable Lead Location: 753860
Implantable Lead Location: 753860
Implantable Pulse Generator Implant Date: 20220603
Lead Channel Impedance Value: 400 Ohm
Lead Channel Impedance Value: 410 Ohm
Lead Channel Pacing Threshold Amplitude: 0.75 V
Lead Channel Pacing Threshold Amplitude: 1 V
Lead Channel Pacing Threshold Pulse Width: 0.5 ms
Lead Channel Pacing Threshold Pulse Width: 0.5 ms
Lead Channel Sensing Intrinsic Amplitude: 10.5 mV
Lead Channel Sensing Intrinsic Amplitude: 2.9 mV
Lead Channel Setting Pacing Amplitude: 2 V
Lead Channel Setting Pacing Amplitude: 2.5 V
Lead Channel Setting Pacing Pulse Width: 0.5 ms
Lead Channel Setting Sensing Sensitivity: 2 mV
Pulse Gen Model: 2272
Pulse Gen Serial Number: 3927571

## 2024-05-20 ENCOUNTER — Ambulatory Visit: Payer: Self-pay | Admitting: Cardiology

## 2024-06-10 DIAGNOSIS — L03012 Cellulitis of left finger: Secondary | ICD-10-CM | POA: Diagnosis not present

## 2024-06-21 ENCOUNTER — Other Ambulatory Visit: Payer: Self-pay | Admitting: Cardiology

## 2024-06-21 DIAGNOSIS — I480 Paroxysmal atrial fibrillation: Secondary | ICD-10-CM

## 2024-06-23 NOTE — Telephone Encounter (Signed)
 Prescription refill request for Eliquis  received. Indication: a fib Last office visit: 01/07/24 Scr: 0.94 epic 04/29/24 Age: 89 Weight: 67kg

## 2024-07-07 ENCOUNTER — Encounter: Payer: Self-pay | Admitting: Student

## 2024-07-07 ENCOUNTER — Ambulatory Visit: Attending: Student | Admitting: Student

## 2024-07-07 VITALS — BP 126/70 | HR 55 | Ht 66.0 in | Wt 143.7 lb

## 2024-07-07 DIAGNOSIS — I4819 Other persistent atrial fibrillation: Secondary | ICD-10-CM | POA: Diagnosis not present

## 2024-07-07 DIAGNOSIS — Z950 Presence of cardiac pacemaker: Secondary | ICD-10-CM | POA: Diagnosis not present

## 2024-07-07 DIAGNOSIS — Z79899 Other long term (current) drug therapy: Secondary | ICD-10-CM | POA: Diagnosis not present

## 2024-07-07 DIAGNOSIS — I495 Sick sinus syndrome: Secondary | ICD-10-CM

## 2024-07-07 NOTE — Progress Notes (Signed)
  Electrophysiology Office Note:   ID:  Rehanna, Oloughlin 1934/07/21, MRN [id number]  Primary Cardiologist: Maude Emmer, MD Electrophysiologist: Soyla Gladis Norton, MD      History of Present Illness:   Amanda Olsen is a 89 y.o. female with h/o HTN, persistent atrial fibrillation (previously on tikosyn , transitioned to amiodarone  with more episodes of AF), sick sinus syndrome s/p Abbott dual-chamber PPM (05/13/2021) seen today for routine electrophysiology followup.   Since last being seen in our clinic the patient reports doing well. Overall, she denies chest pain, palpitations, dyspnea, PND, orthopnea, nausea, vomiting, dizziness, syncope, edema, weight gain, or early satiety.   Review of systems complete and found to be negative unless listed in HPI.   EP Information / Studies Reviewed:    EKG is not ordered today. EKG from 04/29/2024 reviewed which showed AF with RVR        PPM Interrogation-  Most recent remote reviewed today.   Arrhythmia/Device History Abbott Dual Chamber PPM implanted 05/13/2021 for Sinus Node Dysfunction   Physical Exam:   VS:  BP 126/70   Pulse (!) 55   Ht 5' 6 (1.676 m)   Wt 143 lb 11.2 oz (65.2 kg)   SpO2 97%   BMI 23.19 kg/m    Wt Readings from Last 3 Encounters:  07/07/24 143 lb 11.2 oz (65.2 kg)  01/07/24 147 lb 12.8 oz (67 kg)  06/25/23 145 lb 9.6 oz (66 kg)     GEN: No acute distress  NECK: No JVD; No carotid bruits CARDIAC: Regular rate and rhythm, no murmurs, rubs, gallops RESPIRATORY:  Clear to auscultation without rales, wheezing or rhonchi  ABDOMEN: Soft, non-tender, non-distended EXTREMITIES:  No edema; No deformity   ASSESSMENT AND PLAN:    SND s/p Medtronic PPM  Normal PPM function most recent remote  Paroxysmal AF Continue amiodarone  100 mg daily Continue diltiazem  240 mg daily Surveillance labs today.  Continue eliquis  5 mg BID for CHA2DS2/VASc of at least 4. No recurrence by brief check since May  HTN Stable  on current regimen   Secondary hypercoagulable state Pt on Eliquis  as above     Disposition:   Follow up with EP Team in 6 months  Signed, Ozell Prentice Passey, PA-C

## 2024-07-07 NOTE — Patient Instructions (Addendum)
 Medication Instructions:  Your physician recommends that you continue on your current medications as directed. Please refer to the Current Medication list given to you today.  *If you need a refill on your cardiac medications before your next appointment, please call your pharmacy*  Lab Work: CMET, TSH, FreeT4-TODAY If you have labs (blood work) drawn today and your tests are completely normal, you will receive your results only by: MyChart Message (if you have MyChart) OR A paper copy in the mail If you have any lab test that is abnormal or we need to change your treatment, we will call you to review the results.  Follow-Up: At [facility], you and your health needs are our priority.  As part of our continuing mission to provide you with exceptional heart care, our providers are all part of one team.  This team includes your primary Cardiologist (physician) and Advanced Practice Providers or APPs (Physician Assistants and Nurse Practitioners) who all work together to provide you with the care you need, when you need it.  Your next appointment:   6 month(s)  Provider:   You may see Will Gladis Norton, MD or one of the following Advanced Practice Providers on your designated Care Team:   Charlies Arthur, PA-C Michael Andy Tillery, PA-C Suzann Riddle, NP Daphne Barrack, NP

## 2024-07-08 ENCOUNTER — Ambulatory Visit: Payer: Self-pay | Admitting: Student

## 2024-07-08 LAB — COMPREHENSIVE METABOLIC PANEL WITH GFR
ALT: 16 IU/L (ref 0–32)
AST: 28 IU/L (ref 0–40)
Albumin: 4.5 g/dL (ref 3.7–4.7)
Alkaline Phosphatase: 79 IU/L (ref 44–121)
BUN/Creatinine Ratio: 18 (ref 12–28)
BUN: 16 mg/dL (ref 8–27)
Bilirubin Total: 0.4 mg/dL (ref 0.0–1.2)
CO2: 21 mmol/L (ref 20–29)
Calcium: 9.4 mg/dL (ref 8.7–10.3)
Chloride: 100 mmol/L (ref 96–106)
Creatinine, Ser: 0.88 mg/dL (ref 0.57–1.00)
Globulin, Total: 3.2 g/dL (ref 1.5–4.5)
Glucose: 95 mg/dL (ref 70–99)
Potassium: 4.9 mmol/L (ref 3.5–5.2)
Sodium: 136 mmol/L (ref 134–144)
Total Protein: 7.7 g/dL (ref 6.0–8.5)
eGFR: 63 mL/min/1.73 (ref >59)

## 2024-07-08 LAB — TSH: TSH: 6.02 u[IU]/mL — ABNORMAL HIGH (ref 0.450–4.500)

## 2024-07-08 LAB — T4, FREE: Free T4: 1.14 ng/dL (ref 0.82–1.77)

## 2024-07-09 ENCOUNTER — Ambulatory Visit (INDEPENDENT_AMBULATORY_CARE_PROVIDER_SITE_OTHER): Admitting: Orthopedic Surgery

## 2024-07-09 ENCOUNTER — Other Ambulatory Visit: Payer: Self-pay

## 2024-07-09 ENCOUNTER — Other Ambulatory Visit (INDEPENDENT_AMBULATORY_CARE_PROVIDER_SITE_OTHER)

## 2024-07-09 DIAGNOSIS — M79645 Pain in left finger(s): Secondary | ICD-10-CM

## 2024-07-09 DIAGNOSIS — L03012 Cellulitis of left finger: Secondary | ICD-10-CM

## 2024-07-09 NOTE — Progress Notes (Signed)
 Amanda Olsen - 89 y.o. female MRN [id number]  Date of birth: 1934/09/03  Office Visit Note: Visit Date: 07/09/2024 PCP: Amanda Brunet, DO Referred by: Amanda Brunet, DO  Subjective: No chief complaint on file.  HPI: Amanda Olsen is a pleasant 89 y.o. female who presents today for evaluation of a left long finger chronic paronychia that has been present now for approximate 2 months.  She has been seen by her primary care physician, was placed on oral antibiotics as well as topical antibiotics, she also did do soaks for period of time.  Has noticed some ongoing nail discoloration with associate erythema at the eponychial fold region.  No active drainage currently, pain is controlled.  No systemic symptoms.  Pertinent ROS were reviewed with the patient and found to be negative unless otherwise specified above in HPI.   Visit Reason:left middle finger paronychia, chronic Duration of symptoms:2 months Hand dominance: right Occupation:retired Diabetic: No Smoking: No Heart/Lung History:pacemaker Blood Thinners: Elliquis  Prior Testing/EMG:none Injections (Date):none Treatments:2 weeks of ointment(gentamicin ) and oral antibiotic for 10 days(doxy) Prior Surgery:none  Assessment & Plan: Visit Diagnoses:  1. Paronychia of finger of left hand   2. Pain in left finger(s)     Plan: Extensive discussion was had with the patient and her daughter today who is interpreting for her regarding the left long finger paronychia.  I explained the underlying etiology and pathophysiology of this condition as well as treatment drainage from conservative to surgical.  It does appear that they have exhausted a significant amount of conservative treatments with antibiotics, soaks and topical ointment.  I did explain that from a surgical standpoint, we could perform nail plate removal, incision and drainage in order to remove any underlying persistent infection or fluid collection in this region.   Fortunately, there is no major palpable fluid collection today and no significant pain.  Per the daughter, this has been slightly improved over the past week or so.  Understanding her options, they would like to continue with the conservative approach for the time being.  They will follow-up with myself in 1 to 2 weeks for a wound check.  I did explain that should this remain persistent, I would recommend moving forward with incision and drainage in the near future.  Expressed understanding.  I spent 45 minutes in the care of this patient today including review of previous documentation, imaging obtained, face-to-face time discussing all options regarding treatment and documenting the encounter.   Follow-up: No follow-ups on file.   Meds & Orders: No orders of the defined types were placed in this encounter.   Orders Placed This Encounter  Procedures   XR Finger Middle Left     Procedures: No procedures performed      Clinical History: No specialty comments available.  She reports that she has never smoked. She has never used smokeless tobacco. No results for input(s): HGBA1C, LABURIC in the last 8760 hours.  Objective:   Vital Signs: There were no vitals taken for this visit.  Physical Exam  Gen: Well-appearing, in no acute distress; non-toxic CV: Regular Rate. Well-perfused. Warm.  Resp: Breathing unlabored on room air; no wheezing. Psych: Fluid speech in conversation; appropriate affect; normal thought process  Ortho Exam Left long finger: - Moderate erythema at the eponychial fold region dorsally, no significant pain with deep palpation, no expressible drainage, no palpable fluid collection - Nail plate remains well-seated beneath the eponychial fold, notable nail discoloration is seen in comparison to  the other digits - Normal sensation distally  Imaging: XR Finger Middle Left Result Date: 07/09/2024 There is no evidence of fracture or dislocation. Soft tissues are  unremarkable.    Past Medical/Family/Surgical/Social History: Medications & Allergies reviewed per EMR, new medications updated. Patient Active Problem List   Diagnosis Date Noted   Secondary hypercoagulable state (HCC) 11/01/2020   Atrial fibrillation with rapid ventricular response (HCC) 05/05/2020   Visit for monitoring Tikosyn  therapy 07/09/2017   Hypothyroid 11/16/2011   ELECTROCARDIOGRAM, ABNORMAL 05/02/2010   ESSENTIAL HYPERTENSION, BENIGN 04/15/2009   ROSACEA 04/15/2009   Paroxysmal atrial fibrillation (HCC) 10/16/2008   Past Medical History:  Diagnosis Date   Atrial fibrillation (HCC)    mostly in SR on Tikosyn    Cataract    HTN (hypertension)    Rosacea    Family History  Problem Relation Age of Onset   Heart Problems Mother    Past Surgical History:  Procedure Laterality Date   PACEMAKER IMPLANT N/A 05/13/2021   Procedure: PACEMAKER IMPLANT;  Surgeon: Inocencio Soyla Lunger, MD;  [facility];  Service: Cardiovascular;  Laterality: N/A;   VARICOSE VEIN SURGERY     Social History   Occupational History   Not on file  Tobacco Use   Smoking status: Never   Smokeless tobacco: Never  Vaping Use   Vaping status: Never Used  Substance and Sexual Activity   Alcohol use: No   Drug use: No   Sexual activity: Not Currently    Kaile Bixler Estela) Arlinda, M.D. [facility], Hand Surgery

## 2024-07-10 NOTE — Progress Notes (Signed)
 Remote pacemaker transmission.

## 2024-07-24 ENCOUNTER — Ambulatory Visit (INDEPENDENT_AMBULATORY_CARE_PROVIDER_SITE_OTHER): Admitting: Orthopedic Surgery

## 2024-07-24 DIAGNOSIS — L03012 Cellulitis of left finger: Secondary | ICD-10-CM | POA: Diagnosis not present

## 2024-07-24 NOTE — Progress Notes (Signed)
 Amanda Olsen - 89 y.o. female MRN [id number]  Date of birth: 1934-01-24  Office Visit Note: Visit Date: 07/24/2024 PCP: Gerome Brunet, DO Referred by: Gerome Brunet, DO  Subjective: No chief complaint on file.  HPI: Amanda Olsen is a pleasant 89 y.o. female who returns today for evaluation of a left long finger chronic paronychia that has been present now for approximate 2 months.  She has been seen by her primary care physician, was placed on oral antibiotics as well as topical antibiotics, she also did do soaks for period of time.  Remains no active drainage currently, pain is controlled.  No systemic symptoms.  Has noted some good progressive nail growth.  Pertinent ROS were reviewed with the patient and found to be negative unless otherwise specified above in HPI.     Assessment & Plan: Visit Diagnoses:  1. Paronychia of finger of left hand      Plan: She continues to do well overall with conservative measures.  I am pleased to see that she does have some ongoing new nail regrowth which is good.  I explained that this will take months for new nail growth to come in fully to determine how this will look.  Given that she has no ongoing drainage or pain, there is no indication for any surgical intervention at this time.  Return as needed.  Amanda Olsen was with her today and expressed full understanding.   Follow-up: No follow-ups on file.   Meds & Orders: No orders of the defined types were placed in this encounter.   No orders of the defined types were placed in this encounter.    Procedures: No procedures performed      Clinical History: No specialty comments available.  She reports that she has never smoked. She has never used smokeless tobacco. No results for input(s): HGBA1C, LABURIC in the last 8760 hours.  Objective:   Vital Signs: There were no vitals taken for this visit.  Physical Exam  Gen: Well-appearing, in no acute distress; non-toxic CV:  Regular Rate. Well-perfused. Warm.  Resp: Breathing unlabored on room air; no wheezing. Psych: Fluid speech in conversation; appropriate affect; normal thought process  Ortho Exam Left long finger: - Minimal erythema at the eponychial fold region dorsally, no significant pain with deep palpation, no expressible drainage, no palpable fluid collection - Nail plate remains well-seated beneath the eponychial fold - Normal sensation distally  Imaging: No results found.   Past Medical/Family/Surgical/Social History: Medications & Allergies reviewed per EMR, new medications updated. Patient Active Problem List   Diagnosis Date Noted   Secondary hypercoagulable state (HCC) 11/01/2020   Atrial fibrillation with rapid ventricular response (HCC) 05/05/2020   Visit for monitoring Tikosyn  therapy 07/09/2017   Hypothyroid 11/16/2011   ELECTROCARDIOGRAM, ABNORMAL 05/02/2010   ESSENTIAL HYPERTENSION, BENIGN 04/15/2009   ROSACEA 04/15/2009   Paroxysmal atrial fibrillation (HCC) 10/16/2008   Past Medical History:  Diagnosis Date   Atrial fibrillation (HCC)    mostly in SR on Tikosyn    Cataract    HTN (hypertension)    Rosacea    Family History  Problem Relation Age of Onset   Heart Problems Mother    Past Surgical History:  Procedure Laterality Date   PACEMAKER IMPLANT N/A 05/13/2021   Procedure: PACEMAKER IMPLANT;  Surgeon: Inocencio Soyla Lunger, MD;  Location: [facility];  Service: Cardiovascular;  Laterality: N/A;   VARICOSE VEIN SURGERY     Social History   Occupational History  Not on file  Tobacco Use   Smoking status: Never   Smokeless tobacco: Never  Vaping Use   Vaping status: Never Used  Substance and Sexual Activity   Alcohol use: No   Drug use: No   Sexual activity: Not Currently    Oleta Gunnoe Estela) Arlinda, M.D. [facility], Hand Surgery

## 2024-08-12 ENCOUNTER — Ambulatory Visit (INDEPENDENT_AMBULATORY_CARE_PROVIDER_SITE_OTHER): Payer: Medicare HMO

## 2024-08-12 DIAGNOSIS — I495 Sick sinus syndrome: Secondary | ICD-10-CM | POA: Diagnosis not present

## 2024-08-14 LAB — CUP PACEART REMOTE DEVICE CHECK
Battery Remaining Longevity: 91 mo
Battery Remaining Percentage: 75 %
Battery Voltage: 3.02 V
Brady Statistic AP VP Percent: 1 %
Brady Statistic AP VS Percent: 61 %
Brady Statistic AS VP Percent: 1 %
Brady Statistic AS VS Percent: 38 %
Brady Statistic RA Percent Paced: 59 %
Brady Statistic RV Percent Paced: 1 %
Date Time Interrogation Session: 20250902020012
Implantable Lead Connection Status: 753985
Implantable Lead Connection Status: 753985
Implantable Lead Implant Date: 20220603
Implantable Lead Implant Date: 20220603
Implantable Lead Location: 753860
Implantable Lead Location: 753860
Implantable Pulse Generator Implant Date: 20220603
Lead Channel Impedance Value: 400 Ohm
Lead Channel Impedance Value: 410 Ohm
Lead Channel Pacing Threshold Amplitude: 0.75 V
Lead Channel Pacing Threshold Amplitude: 1 V
Lead Channel Pacing Threshold Pulse Width: 0.5 ms
Lead Channel Pacing Threshold Pulse Width: 0.5 ms
Lead Channel Sensing Intrinsic Amplitude: 11.4 mV
Lead Channel Sensing Intrinsic Amplitude: 2.4 mV
Lead Channel Setting Pacing Amplitude: 2 V
Lead Channel Setting Pacing Amplitude: 2.5 V
Lead Channel Setting Pacing Pulse Width: 0.5 ms
Lead Channel Setting Sensing Sensitivity: 2 mV
Pulse Gen Model: 2272
Pulse Gen Serial Number: 3927571

## 2024-08-17 ENCOUNTER — Ambulatory Visit: Payer: Self-pay | Admitting: Cardiology

## 2024-08-18 ENCOUNTER — Telehealth: Payer: Self-pay | Admitting: Orthopedic Surgery

## 2024-08-18 NOTE — Telephone Encounter (Signed)
 Patient daughter called and said her middle finger on the left it is infected under the nail and she don't know what to do.  [phone]

## 2024-08-19 NOTE — Progress Notes (Signed)
 Remote PPM Transmission

## 2024-08-19 NOTE — Telephone Encounter (Signed)
 Called pt, will be in tomorrow 08/20/24 at 10:00 am

## 2024-08-20 ENCOUNTER — Ambulatory Visit (INDEPENDENT_AMBULATORY_CARE_PROVIDER_SITE_OTHER): Admitting: Orthopedic Surgery

## 2024-08-20 DIAGNOSIS — L03012 Cellulitis of left finger: Secondary | ICD-10-CM | POA: Diagnosis not present

## 2024-08-20 NOTE — Progress Notes (Signed)
 Amanda Olsen - 89 y.o. female MRN [id number]  Date of birth: [birth year]  Office Visit Note: Visit Date: 08/20/2024 PCP: Gerome Brunet, DO Referred by: Gerome Brunet, DO  Subjective: No chief complaint on file.  HPI: Amanda Olsen is a pleasant 89 y.o. female who returns today for evaluation of a left long finger chronic paronychia that has been present now for multiple months.  She has been seen by her primary care physician, was placed on oral antibiotics as well as topical antibiotics, she also did do soaks for period of time.  Remains no active drainage currently, pain is controlled.  No systemic symptoms.  Has noted some good progressive nail growth.  Pertinent ROS were reviewed with the patient and found to be negative unless otherwise specified above in HPI.     Assessment & Plan: Visit Diagnoses:  1. Paronychia of finger of left hand       Plan: She continues to do well overall with conservative measures. Given that she has no ongoing drainage or pain, there is no indication for any surgical intervention at this time.  Return as needed.  Daughter was with her today and expressed full understanding.   Follow-up: No follow-ups on file.   Meds & Orders: No orders of the defined types were placed in this encounter.   No orders of the defined types were placed in this encounter.    Procedures: No procedures performed      Clinical History: No specialty comments available.  She reports that she has never smoked. She has never used smokeless tobacco. No results for input(s): HGBA1C, LABURIC in the last 8760 hours.  Objective:   Vital Signs: There were no vitals taken for this visit.  Physical Exam  Gen: Well-appearing, in no acute distress; non-toxic CV: Regular Rate. Well-perfused. Warm.  Resp: Breathing unlabored on room air; no wheezing. Psych: Fluid speech in conversation; appropriate affect; normal thought process  Ortho Exam Left long  finger: - Moderate erythema at the eponychial fold region dorsally, no significant pain with deep palpation, no expressible drainage, no palpable fluid collection - Nail plate remains well-seated beneath the eponychial fold, some discoloration of the nail plate - Normal sensation distally  Imaging: No results found.   Past Medical/Family/Surgical/Social History: Medications & Allergies reviewed per EMR, new medications updated. Patient Active Problem List   Diagnosis Date Noted   Secondary hypercoagulable state (HCC) 11/01/2020   Atrial fibrillation with rapid ventricular response (HCC) 05/05/2020   Visit for monitoring Tikosyn  therapy 07/09/2017   Hypothyroid 11/16/2011   ELECTROCARDIOGRAM, ABNORMAL 05/02/2010   ESSENTIAL HYPERTENSION, BENIGN 04/15/2009   ROSACEA 04/15/2009   Paroxysmal atrial fibrillation (HCC) 10/16/2008   Past Medical History:  Diagnosis Date   Atrial fibrillation (HCC)    mostly in SR on Tikosyn    Cataract    HTN (hypertension)    Rosacea    Family History  Problem Relation Age of Onset   Heart Problems Mother    Past Surgical History:  Procedure Laterality Date   PACEMAKER IMPLANT N/A 05/13/2021   Procedure: PACEMAKER IMPLANT;  Surgeon: Inocencio Soyla Lunger, MD;  Location: [facility];  Service: Cardiovascular;  Laterality: N/A;   VARICOSE VEIN SURGERY     Social History   Occupational History   Not on file  Tobacco Use   Smoking status: Never   Smokeless tobacco: Never  Vaping Use   Vaping status: Never Used  Substance and Sexual Activity   Alcohol use:  No   Drug use: No   Sexual activity: Not Currently    Jonica Bickhart Estela) Arlinda, M.D. [facility], Hand Surgery

## 2024-08-28 DIAGNOSIS — E039 Hypothyroidism, unspecified: Secondary | ICD-10-CM | POA: Diagnosis not present

## 2024-08-28 DIAGNOSIS — N1831 Chronic kidney disease, stage 3a: Secondary | ICD-10-CM | POA: Diagnosis not present

## 2024-08-28 DIAGNOSIS — R7309 Other abnormal glucose: Secondary | ICD-10-CM | POA: Diagnosis not present

## 2024-08-28 DIAGNOSIS — I129 Hypertensive chronic kidney disease with stage 1 through stage 4 chronic kidney disease, or unspecified chronic kidney disease: Secondary | ICD-10-CM | POA: Diagnosis not present

## 2024-08-28 DIAGNOSIS — Z950 Presence of cardiac pacemaker: Secondary | ICD-10-CM | POA: Diagnosis not present

## 2024-08-28 DIAGNOSIS — I4891 Unspecified atrial fibrillation: Secondary | ICD-10-CM | POA: Diagnosis not present

## 2024-08-28 DIAGNOSIS — I495 Sick sinus syndrome: Secondary | ICD-10-CM | POA: Diagnosis not present

## 2024-09-03 DIAGNOSIS — I495 Sick sinus syndrome: Secondary | ICD-10-CM | POA: Diagnosis not present

## 2024-09-03 DIAGNOSIS — L03019 Cellulitis of unspecified finger: Secondary | ICD-10-CM | POA: Diagnosis not present

## 2024-09-03 DIAGNOSIS — E559 Vitamin D deficiency, unspecified: Secondary | ICD-10-CM | POA: Diagnosis not present

## 2024-09-03 DIAGNOSIS — N1831 Chronic kidney disease, stage 3a: Secondary | ICD-10-CM | POA: Diagnosis not present

## 2024-09-03 DIAGNOSIS — I129 Hypertensive chronic kidney disease with stage 1 through stage 4 chronic kidney disease, or unspecified chronic kidney disease: Secondary | ICD-10-CM | POA: Diagnosis not present

## 2024-09-03 DIAGNOSIS — E039 Hypothyroidism, unspecified: Secondary | ICD-10-CM | POA: Diagnosis not present

## 2024-09-03 DIAGNOSIS — Z950 Presence of cardiac pacemaker: Secondary | ICD-10-CM | POA: Diagnosis not present

## 2024-09-03 DIAGNOSIS — I4891 Unspecified atrial fibrillation: Secondary | ICD-10-CM | POA: Diagnosis not present

## 2024-09-03 DIAGNOSIS — Z23 Encounter for immunization: Secondary | ICD-10-CM | POA: Diagnosis not present

## 2024-10-13 ENCOUNTER — Encounter: Payer: Self-pay | Admitting: Radiology

## 2024-11-11 ENCOUNTER — Ambulatory Visit: Payer: Medicare HMO

## 2024-11-11 DIAGNOSIS — I495 Sick sinus syndrome: Secondary | ICD-10-CM | POA: Diagnosis not present

## 2024-11-12 LAB — CUP PACEART REMOTE DEVICE CHECK
Battery Remaining Longevity: 88 mo
Battery Remaining Percentage: 73 %
Battery Voltage: 3.02 V
Brady Statistic AP VP Percent: 1 %
Brady Statistic AP VS Percent: 64 %
Brady Statistic AS VP Percent: 1 %
Brady Statistic AS VS Percent: 36 %
Brady Statistic RA Percent Paced: 62 %
Brady Statistic RV Percent Paced: 1 %
Date Time Interrogation Session: 20251202020020
Implantable Lead Connection Status: 753985
Implantable Lead Connection Status: 753985
Implantable Lead Implant Date: 20220603
Implantable Lead Implant Date: 20220603
Implantable Lead Location: 753860
Implantable Lead Location: 753860
Implantable Pulse Generator Implant Date: 20220603
Lead Channel Impedance Value: 400 Ohm
Lead Channel Impedance Value: 440 Ohm
Lead Channel Pacing Threshold Amplitude: 0.75 V
Lead Channel Pacing Threshold Amplitude: 1 V
Lead Channel Pacing Threshold Pulse Width: 0.5 ms
Lead Channel Pacing Threshold Pulse Width: 0.5 ms
Lead Channel Sensing Intrinsic Amplitude: 12 mV
Lead Channel Sensing Intrinsic Amplitude: 2.6 mV
Lead Channel Setting Pacing Amplitude: 2 V
Lead Channel Setting Pacing Amplitude: 2.5 V
Lead Channel Setting Pacing Pulse Width: 0.5 ms
Lead Channel Setting Sensing Sensitivity: 2 mV
Pulse Gen Model: 2272
Pulse Gen Serial Number: 3927571

## 2024-11-14 NOTE — Progress Notes (Signed)
 Remote PPM Transmission

## 2024-11-20 ENCOUNTER — Ambulatory Visit: Payer: Self-pay | Admitting: Cardiology

## 2024-12-13 ENCOUNTER — Other Ambulatory Visit: Payer: Self-pay | Admitting: Student

## 2024-12-16 DIAGNOSIS — I480 Paroxysmal atrial fibrillation: Secondary | ICD-10-CM

## 2024-12-16 NOTE — Telephone Encounter (Signed)
 Prescription refill request for Eliquis  received. Indication:afib Last office visit:7/25 Scr: 0.88  7/25 Age:89 Weight:65.2  kg  Prescription refilled

## 2024-12-29 ENCOUNTER — Encounter: Payer: Self-pay | Admitting: Cardiology

## 2024-12-29 DIAGNOSIS — I480 Paroxysmal atrial fibrillation: Secondary | ICD-10-CM

## 2024-12-29 MED ORDER — APIXABAN 5 MG PO TABS: 5.0000 mg | ORAL_TABLET | Freq: Two times a day (BID) | ORAL | 1 refills | Status: AC

## 2025-01-06 ENCOUNTER — Ambulatory Visit: Admitting: Orthopedic Surgery

## 2025-01-13 ENCOUNTER — Encounter: Payer: Self-pay | Admitting: Pulmonary Disease

## 2025-01-13 ENCOUNTER — Ambulatory Visit: Admitting: Pulmonary Disease

## 2025-01-13 VITALS — BP 148/70 | HR 64 | Ht 66.0 in | Wt 139.8 lb

## 2025-01-13 DIAGNOSIS — I4819 Other persistent atrial fibrillation: Secondary | ICD-10-CM

## 2025-01-13 DIAGNOSIS — Z79899 Other long term (current) drug therapy: Secondary | ICD-10-CM

## 2025-01-13 DIAGNOSIS — D6869 Other thrombophilia: Secondary | ICD-10-CM

## 2025-01-13 LAB — CUP PACEART INCLINIC DEVICE CHECK
Battery Remaining Longevity: 93 mo
Battery Voltage: 3.02 V
Brady Statistic RA Percent Paced: 63 %
Brady Statistic RV Percent Paced: 0.42 %
Date Time Interrogation Session: 20260203161005
Implantable Lead Connection Status: 753985
Implantable Lead Connection Status: 753985
Implantable Lead Implant Date: 20220603
Implantable Lead Implant Date: 20220603
Implantable Lead Location: 753860
Implantable Lead Location: 753860
Implantable Pulse Generator Implant Date: 20220603
Lead Channel Impedance Value: 437.5 Ohm
Lead Channel Impedance Value: 450 Ohm
Lead Channel Pacing Threshold Amplitude: 0.75 V
Lead Channel Pacing Threshold Amplitude: 0.75 V
Lead Channel Pacing Threshold Amplitude: 1 V
Lead Channel Pacing Threshold Amplitude: 1 V
Lead Channel Pacing Threshold Pulse Width: 0.5 ms
Lead Channel Pacing Threshold Pulse Width: 0.5 ms
Lead Channel Pacing Threshold Pulse Width: 0.5 ms
Lead Channel Pacing Threshold Pulse Width: 0.5 ms
Lead Channel Sensing Intrinsic Amplitude: 12 mV
Lead Channel Sensing Intrinsic Amplitude: 2.1 mV
Lead Channel Setting Pacing Amplitude: 2 V
Lead Channel Setting Pacing Amplitude: 2.5 V
Lead Channel Setting Pacing Pulse Width: 0.5 ms
Lead Channel Setting Sensing Sensitivity: 2 mV
Pulse Gen Model: 2272
Pulse Gen Serial Number: 3927571

## 2025-01-13 NOTE — Progress Notes (Signed)
" °  Electrophysiology Office Note:   Date:  01/13/2025  ID:  [name], DOB September 04, 1934, MRN [id number]  Primary Cardiologist: Maude Emmer, MD Primary Heart Failure: None Electrophysiologist: Will Gladis Norton, MD       History of Present Illness:   Amanda Olsen is a 89 y.o. female with h/o AF, SND s/p PPM, HTN seen today for routine electrophysiology followup.   Since last being seen in our clinic the patient reports doing well overall. She denies issues with amiodarone  - denies dry cough, SOB, tremor or vision changes.  No issues on Eliquis . She denies chest pain, palpitations, dyspnea, PND, orthopnea, nausea, vomiting, dizziness, syncope, edema, weight gain, or early satiety.   Review of systems complete and found to be negative unless listed in HPI.   EP Information / Studies Reviewed:    EKG is not ordered today. EKG from 04/2024 reviewed which showed SB 51 BPM, LBBB      PPM Interrogation-  reviewed in detail today,  See PACEART report.  Arrhythmia/Device History: Abbott Dual Chamber PPM implanted 05/13/2021 for Sinus Node Dysfunction   Arrhythmia / AAD / Pertinent EP Studies AF  Tikosyn  06/2017 > 02/2021 Amiodarone  2022 >   Physical Exam:   VS:  BP (!) 148/70   Pulse 64   Ht 5' 6 (1.676 m)   Wt 139 lb 12.8 oz (63.4 kg)   SpO2 97%   BMI 22.56 kg/m    Wt Readings from Last 3 Encounters:  01/13/25 139 lb 12.8 oz (63.4 kg)  07/07/24 143 lb 11.2 oz (65.2 kg)  01/07/24 147 lb 12.8 oz (67 kg)     GEN: Well nourished, well developed in no acute distress NECK: No JVD; No carotid bruits CARDIAC: Regular rate and rhythm, no murmurs, rubs, gallops RESPIRATORY:  Clear to auscultation without rales, wheezing or rhonchi  ABDOMEN: Soft, non-tender, non-distended EXTREMITIES:  No edema; No deformity   ASSESSMENT AND PLAN:    SND s/p Abbott PPM  -Normal PPM function -See Pace Art report -No changes today  Persistent Atrial Fibrillation  High Risk Medication  Management: Amiodarone   CHA2DS2-VASc 4 -OAC for stroke prophylaxis  -cardizem  240 mg daily  -amiodarone  100 mg daily  -update amio labs > CMP, TSH, free T4 -no AF burden since 04/2024   Secondary Hypercoagulable State  -continue Eliquis  5mg  BID, dose reviewed and appropriate by wt / Cr   Hypertension  -mildly elevated in clinic, monitor trend   Disposition:   Follow up with EP APP in 6 months  Signed, Daphne Barrack, NP-C, AGACNP-BC [facility] - Electrophysiology  01/13/2025, 4:12 PM  "

## 2025-01-14 ENCOUNTER — Ambulatory Visit: Payer: Self-pay | Admitting: Pulmonary Disease

## 2025-01-14 LAB — COMPREHENSIVE METABOLIC PANEL WITH GFR
ALT: 16 [IU]/L (ref 0–32)
AST: 27 [IU]/L (ref 0–40)
Albumin: 4.4 g/dL (ref 3.7–4.7)
Alkaline Phosphatase: 88 [IU]/L (ref 48–129)
BUN/Creatinine Ratio: 21 (ref 12–28)
BUN: 20 mg/dL (ref 8–27)
Bilirubin Total: 0.5 mg/dL (ref 0.0–1.2)
CO2: 22 mmol/L (ref 20–29)
Calcium: 9.3 mg/dL (ref 8.7–10.3)
Chloride: 99 mmol/L (ref 96–106)
Creatinine, Ser: 0.94 mg/dL (ref 0.57–1.00)
Globulin, Total: 2.9 g/dL (ref 1.5–4.5)
Glucose: 97 mg/dL (ref 70–99)
Potassium: 4.5 mmol/L (ref 3.5–5.2)
Sodium: 134 mmol/L (ref 134–144)
Total Protein: 7.3 g/dL (ref 6.0–8.5)
eGFR: 58 mL/min/{1.73_m2} — ABNORMAL LOW

## 2025-01-14 LAB — T4, FREE: Free T4: 1.21 ng/dL (ref 0.82–1.77)

## 2025-01-14 LAB — TSH: TSH: 9.22 u[IU]/mL — AB (ref 0.450–4.500)

## 2025-01-20 ENCOUNTER — Ambulatory Visit: Admitting: Orthopedic Surgery
